# Patient Record
Sex: Female | Born: 1980 | Race: White | Hispanic: No | Marital: Married | Smoking: Former smoker
Health system: Southern US, Community
[De-identification: ages and names within clinical notes are randomized; demographics above are authoritative.]

## PROBLEM LIST (undated history)

## (undated) DIAGNOSIS — R51 Headache: Secondary | ICD-10-CM

## (undated) DIAGNOSIS — F419 Anxiety disorder, unspecified: Secondary | ICD-10-CM

## (undated) DIAGNOSIS — G8929 Other chronic pain: Secondary | ICD-10-CM

## (undated) DIAGNOSIS — Z9889 Other specified postprocedural states: Secondary | ICD-10-CM

## (undated) DIAGNOSIS — R519 Headache, unspecified: Secondary | ICD-10-CM

## (undated) HISTORY — PX: CERVICAL DISC SURGERY: SHX588

## (undated) HISTORY — PX: APPENDECTOMY: SHX54

## (undated) HISTORY — PX: ENDOMETRIAL ABLATION: SHX621

---

## 2003-11-06 ENCOUNTER — Ambulatory Visit (HOSPITAL_COMMUNITY): Admission: RE | Admit: 2003-11-06 | Discharge: 2003-11-06 | Payer: Self-pay | Admitting: Family Medicine

## 2004-03-13 ENCOUNTER — Ambulatory Visit: Payer: Self-pay | Admitting: Family Medicine

## 2004-04-16 ENCOUNTER — Ambulatory Visit: Payer: Self-pay | Admitting: Family Medicine

## 2004-09-19 ENCOUNTER — Ambulatory Visit: Payer: Self-pay | Admitting: Family Medicine

## 2005-03-14 ENCOUNTER — Ambulatory Visit: Payer: Self-pay | Admitting: Family Medicine

## 2005-04-29 ENCOUNTER — Ambulatory Visit: Payer: Self-pay | Admitting: Family Medicine

## 2005-09-05 ENCOUNTER — Ambulatory Visit: Payer: Self-pay | Admitting: Family Medicine

## 2005-10-09 ENCOUNTER — Ambulatory Visit: Payer: Self-pay | Admitting: Family Medicine

## 2006-06-09 ENCOUNTER — Ambulatory Visit: Payer: Self-pay | Admitting: Family Medicine

## 2006-07-01 ENCOUNTER — Ambulatory Visit: Payer: Self-pay | Admitting: Family Medicine

## 2007-02-11 ENCOUNTER — Ambulatory Visit (HOSPITAL_COMMUNITY): Payer: Self-pay | Admitting: Psychiatry

## 2007-02-19 ENCOUNTER — Ambulatory Visit (HOSPITAL_COMMUNITY): Payer: Self-pay | Admitting: Psychiatry

## 2007-03-04 ENCOUNTER — Ambulatory Visit (HOSPITAL_COMMUNITY): Payer: Self-pay | Admitting: Psychiatry

## 2007-03-18 ENCOUNTER — Ambulatory Visit (HOSPITAL_COMMUNITY): Payer: Self-pay | Admitting: Psychiatry

## 2007-03-25 ENCOUNTER — Ambulatory Visit (HOSPITAL_COMMUNITY): Payer: Self-pay | Admitting: Psychiatry

## 2007-03-30 ENCOUNTER — Ambulatory Visit (HOSPITAL_COMMUNITY): Payer: Self-pay | Admitting: Psychiatry

## 2007-04-06 ENCOUNTER — Ambulatory Visit (HOSPITAL_COMMUNITY): Payer: Self-pay | Admitting: Psychiatry

## 2007-04-22 ENCOUNTER — Ambulatory Visit (HOSPITAL_COMMUNITY): Payer: Self-pay | Admitting: Psychiatry

## 2007-04-27 ENCOUNTER — Ambulatory Visit (HOSPITAL_COMMUNITY): Payer: Self-pay | Admitting: Psychiatry

## 2007-05-11 ENCOUNTER — Ambulatory Visit (HOSPITAL_COMMUNITY): Payer: Self-pay | Admitting: Psychiatry

## 2007-05-17 ENCOUNTER — Ambulatory Visit (HOSPITAL_COMMUNITY): Payer: Self-pay | Admitting: Psychiatry

## 2007-05-20 ENCOUNTER — Ambulatory Visit (HOSPITAL_COMMUNITY): Payer: Self-pay | Admitting: Psychiatry

## 2007-05-25 ENCOUNTER — Ambulatory Visit (HOSPITAL_COMMUNITY): Payer: Self-pay | Admitting: Psychiatry

## 2007-06-02 ENCOUNTER — Ambulatory Visit (HOSPITAL_COMMUNITY): Payer: Self-pay | Admitting: Psychiatry

## 2007-06-10 ENCOUNTER — Ambulatory Visit (HOSPITAL_COMMUNITY): Payer: Self-pay | Admitting: Psychiatry

## 2009-11-06 ENCOUNTER — Emergency Department (HOSPITAL_COMMUNITY): Admission: EM | Admit: 2009-11-06 | Discharge: 2009-11-07 | Payer: Self-pay | Admitting: Emergency Medicine

## 2010-07-17 ENCOUNTER — Emergency Department (HOSPITAL_BASED_OUTPATIENT_CLINIC_OR_DEPARTMENT_OTHER)
Admission: EM | Admit: 2010-07-17 | Discharge: 2010-07-17 | Disposition: A | Discharge status: Home / Self Care | Payer: PRIVATE HEALTH INSURANCE | Attending: Emergency Medicine | Admitting: Emergency Medicine

## 2010-07-17 DIAGNOSIS — N9489 Other specified conditions associated with female genital organs and menstrual cycle: Secondary | ICD-10-CM | POA: Insufficient documentation

## 2010-07-17 DIAGNOSIS — F17200 Nicotine dependence, unspecified, uncomplicated: Secondary | ICD-10-CM | POA: Insufficient documentation

## 2010-07-17 DIAGNOSIS — I10 Essential (primary) hypertension: Secondary | ICD-10-CM | POA: Insufficient documentation

## 2010-07-17 LAB — PREGNANCY, URINE: Preg Test, Ur: NEGATIVE

## 2010-07-17 LAB — URINALYSIS, ROUTINE W REFLEX MICROSCOPIC
Bilirubin Urine: NEGATIVE
Leukocytes, UA: NEGATIVE
pH: 6 (ref 5.0–8.0)

## 2010-07-17 LAB — URINE MICROSCOPIC-ADD ON

## 2010-07-29 LAB — CBC
HCT: 41.6 % (ref 36.0–46.0)
Hemoglobin: 14.2 g/dL (ref 12.0–15.0)
MCH: 34 pg (ref 26.0–34.0)
MCV: 99.4 fL (ref 78.0–100.0)
Platelets: 222 10*3/uL (ref 150–400)
RDW: 13.9 % (ref 11.5–15.5)

## 2010-07-29 LAB — DIFFERENTIAL
Eosinophils Relative: 1 % (ref 0–5)
Lymphocytes Relative: 35 % (ref 12–46)
Lymphs Abs: 3.8 10*3/uL (ref 0.7–4.0)
Monocytes Absolute: 0.6 10*3/uL (ref 0.1–1.0)
Neutrophils Relative %: 58 % (ref 43–77)

## 2010-07-29 LAB — BASIC METABOLIC PANEL
GFR calc Af Amer: >60 mL/min (ref >60)
GFR calc non Af Amer: >60 mL/min (ref >60)
Glucose, Bld: 93 mg/dL (ref 70–99)
Sodium: 142 mEq/L (ref 135–145)

## 2010-07-29 LAB — URINALYSIS, ROUTINE W REFLEX MICROSCOPIC
Ketones, ur: NEGATIVE mg/dL
Nitrite: NEGATIVE
Protein, ur: NEGATIVE mg/dL
Specific Gravity, Urine: 1.015 (ref 1.005–1.030)
pH: 6.5 (ref 5.0–8.0)

## 2010-07-29 LAB — POCT CARDIAC MARKERS: Myoglobin, poc: 52.5 ng/mL (ref 12–200)

## 2010-07-29 LAB — URINE MICROSCOPIC-ADD ON

## 2012-02-17 ENCOUNTER — Encounter (HOSPITAL_COMMUNITY): Payer: Self-pay | Admitting: *Deleted

## 2012-02-17 ENCOUNTER — Other Ambulatory Visit: Payer: Self-pay

## 2012-02-17 ENCOUNTER — Emergency Department (HOSPITAL_COMMUNITY)
Admission: EM | Admit: 2012-02-17 | Discharge: 2012-02-17 | Disposition: A | Discharge status: Home / Self Care | Payer: PRIVATE HEALTH INSURANCE | Attending: Emergency Medicine | Admitting: Emergency Medicine

## 2012-02-17 ENCOUNTER — Emergency Department (HOSPITAL_COMMUNITY): Payer: PRIVATE HEALTH INSURANCE

## 2012-02-17 DIAGNOSIS — R079 Chest pain, unspecified: Secondary | ICD-10-CM | POA: Insufficient documentation

## 2012-02-17 DIAGNOSIS — R0789 Other chest pain: Secondary | ICD-10-CM

## 2012-02-17 DIAGNOSIS — R0602 Shortness of breath: Secondary | ICD-10-CM | POA: Insufficient documentation

## 2012-02-17 LAB — CBC WITH DIFFERENTIAL/PLATELET
Basophils Absolute: 0.1 10*3/uL (ref 0.0–0.1)
Basophils Relative: 1 % (ref 0–1)
Eosinophils Absolute: 0.1 10*3/uL (ref 0.0–0.7)
Eosinophils Relative: 2 % (ref 0–5)
HCT: 38.3 % (ref 36.0–46.0)
MCHC: 33.9 g/dL (ref 30.0–36.0)
Monocytes Absolute: 0.4 10*3/uL (ref 0.1–1.0)
Monocytes Relative: 6 % (ref 3–12)
Platelets: 202 10*3/uL (ref 150–400)
WBC: 6.9 10*3/uL (ref 4.0–10.5)

## 2012-02-17 LAB — COMPREHENSIVE METABOLIC PANEL
ALT: 16 U/L (ref 0–35)
AST: 15 U/L (ref 0–37)
Albumin: 3.8 g/dL (ref 3.5–5.2)
BUN: 11 mg/dL (ref 6–23)
CO2: 27 mEq/L (ref 19–32)
Chloride: 104 mEq/L (ref 96–112)
Creatinine, Ser: 1.03 mg/dL (ref 0.50–1.10)
GFR calc Af Amer: 83 mL/min — ABNORMAL LOW (ref >90)
Total Protein: 7 g/dL (ref 6.0–8.3)

## 2012-02-17 MED ORDER — GI COCKTAIL ~~LOC~~
30.0000 mL | Freq: Once | ORAL | Status: AC
  Administered 2012-02-17: 30 mL via ORAL
  Filled 2012-02-17: qty 30

## 2012-02-17 MED ORDER — KETOROLAC TROMETHAMINE 60 MG/2ML IM SOLN
60.0000 mg | Freq: Once | INTRAMUSCULAR | Status: AC
  Administered 2012-02-17: 60 mg via INTRAMUSCULAR
  Filled 2012-02-17: qty 2

## 2012-02-17 MED ORDER — IBUPROFEN 800 MG PO TABS: 800.0000 mg | ORAL_TABLET | Freq: Three times a day (TID) | ORAL | Status: DC

## 2012-02-17 NOTE — ED Provider Notes (Signed)
History     CSN: [id number]  Arrival date & time 02/17/12  1212   First MD Initiated Contact with Patient 02/17/12 1320      Chief Complaint  Patient presents with  . Chest Pain    (Consider location/radiation/quality/duration/timing/severity/associated sxs/prior treatment) HPI Comments: Patient presents with sharp stabbing chest pain for the past 3 days. Has become constant though started intermittently. It is in the left side of her chest and radiates to the right chest as well as left arm. . it is worse with deep breathing and worse with arm movement. She denies any shortness of breath, nausea, fever, diaphoresis. No abdominal pain or back pain. No cardiac history. No recent travel, leg pain leg swelling. She is not on birth control.  The history is provided by the patient.    History reviewed. No pertinent past medical history.  Past Surgical History  Procedure Date  . Appendectomy     History reviewed. No pertinent family history.  History  Substance Use Topics  . Smoking status: Never Smoker   . Smokeless tobacco: Not on file  . Alcohol Use: Yes    OB History    Grav Para Term Preterm Abortions TAB SAB Ect Mult Living                  Review of Systems  Constitutional: Negative for fever, activity change and appetite change.  HENT: Negative for congestion and rhinorrhea.   Respiratory: Positive for chest tightness and shortness of breath. Negative for cough.   Cardiovascular: Positive for chest pain.  Gastrointestinal: Negative for nausea, vomiting and abdominal pain.  Genitourinary: Negative for dysuria and hematuria.  Musculoskeletal: Negative for myalgias, back pain and arthralgias.  Skin: Negative for rash.  Neurological: Negative for dizziness, weakness and headaches.    Allergies  Codeine  Home Medications   Current Outpatient Rx  Name Route Sig Dispense Refill  . ASPIRIN-ACETAMINOPHEN-CAFFEINE 250-250-65 MG PO TABS Oral Take 1 tablet by mouth  every 6 (six) hours as needed. Headache    . BUPROPION HCL ER (XL) 300 MG PO TB24 Oral Take 300 mg by mouth daily.    . IBUPROFEN 800 MG PO TABS Oral Take 800 mg by mouth every 8 (eight) hours as needed. Pain    . LORAZEPAM 1 MG PO TABS Oral Take 1 mg by mouth at bedtime as needed. Sleep    . ADULT MULTIVITAMIN W/MINERALS CH Oral Take 1 tablet by mouth daily.    . IBUPROFEN 800 MG PO TABS Oral Take 1 tablet (800 mg total) by mouth 3 (three) times daily. 21 tablet 0    BP 130/81  Pulse 74  Temp 98.3 F (36.8 C) (Oral)  Resp 20  Ht 5\' 9"  (1.753 m)  Wt 195 lb (88.451 kg)  BMI 28.80 kg/m2  SpO2 99%  LMP 02/06/2012  Physical Exam  Constitutional: She is oriented to person, place, and time. She appears well-developed and well-nourished. No distress.  HENT:  Head: Normocephalic and atraumatic.  Mouth/Throat: Oropharynx is clear and moist. No oropharyngeal exudate.  Eyes: Conjunctivae normal and EOM are normal. Pupils are equal, round, and reactive to light.  Neck: Normal range of motion. Neck supple.  Cardiovascular: Normal rate, regular rhythm and normal heart sounds.   No murmur heard. Pulmonary/Chest: Effort normal and breath sounds normal. No respiratory distress. She exhibits tenderness.       TTP L chest wall, pain worse with L arm movement.  Abdominal: Soft. There is no  tenderness. There is no rebound and no guarding.  Musculoskeletal: Normal range of motion. She exhibits no edema and no tenderness.  Neurological: She is alert and oriented to person, place, and time. No cranial nerve deficit.  Skin: Skin is warm.    ED Course  Procedures (including critical care time)  Labs Reviewed  COMPREHENSIVE METABOLIC PANEL - Abnormal; Notable for the following:    GFR calc non Af Amer 72 (*)     GFR calc Af Amer 83 (*)     All other components within normal limits  CBC WITH DIFFERENTIAL  TROPONIN I  D-DIMER, QUANTITATIVE   Dg Chest 2 View  02/17/2012  *RADIOLOGY REPORT*   Clinical Data: Chest pain  CHEST - 2 VIEW  Comparison: November 06, 2009  Findings: Lungs clear.  Heart size and pulmonary vascularity are normal.  No adenopathy.  No bone lesions.  IMPRESSION: Lungs clear.   Original Report Authenticated By: Arvin Collard. WOODRUFF III, M.D.      1. Atypical chest pain       MDM  Intermittent chest pain worse with deep breathing, worse with arm movement, pleuritic component. No associated symptoms.  Chest xray negative.  D-dimer negative. Pain improved with NSAIDs. Atypical for ACS. Possible pleurisy. Workup unremarkable.  Supportive care with NSAIDs, followup PCP.   Date: 02/17/2012  Rate: 81  Rhythm: normal sinus rhythm  QRS Axis: normal  Intervals: normal  ST/T Wave abnormalities: normal  Conduction Disutrbances:right bundle branch block  Narrative Interpretation: no prolonged QT, no Brugada  Old EKG Reviewed: unchanged         Glynn Octave, MD 02/17/12 1449

## 2012-02-17 NOTE — ED Notes (Signed)
Chest pain intermittently for 2 days, Increased pain with deep breath, sharp, stabbing pain.  No n/v

## 2012-10-06 ENCOUNTER — Ambulatory Visit (HOSPITAL_COMMUNITY): Payer: PRIVATE HEALTH INSURANCE | Admitting: Psychology

## 2012-10-20 ENCOUNTER — Ambulatory Visit (HOSPITAL_COMMUNITY): Payer: PRIVATE HEALTH INSURANCE | Admitting: Psychiatry

## 2013-09-03 ENCOUNTER — Encounter: Payer: Self-pay | Admitting: Emergency Medicine

## 2013-09-03 ENCOUNTER — Emergency Department
Admission: EM | Admit: 2013-09-03 | Discharge: 2013-09-03 | Disposition: A | Discharge status: Home / Self Care | Payer: 59 | Source: Home / Self Care | Attending: Family Medicine | Admitting: Family Medicine

## 2013-09-03 DIAGNOSIS — L03039 Cellulitis of unspecified toe: Secondary | ICD-10-CM

## 2013-09-03 DIAGNOSIS — L02619 Cutaneous abscess of unspecified foot: Secondary | ICD-10-CM

## 2013-09-03 DIAGNOSIS — L089 Local infection of the skin and subcutaneous tissue, unspecified: Secondary | ICD-10-CM

## 2013-09-03 DIAGNOSIS — S90859A Superficial foreign body, unspecified foot, initial encounter: Secondary | ICD-10-CM

## 2013-09-03 DIAGNOSIS — S90451A Superficial foreign body, right great toe, initial encounter: Secondary | ICD-10-CM

## 2013-09-03 DIAGNOSIS — L03031 Cellulitis of right toe: Secondary | ICD-10-CM

## 2013-09-03 HISTORY — DX: Headache, unspecified: R51.9

## 2013-09-03 HISTORY — DX: Other chronic pain: G89.29

## 2013-09-03 HISTORY — DX: Anxiety disorder, unspecified: F41.9

## 2013-09-03 HISTORY — DX: Headache: R51

## 2013-09-03 MED ORDER — TETANUS-DIPHTH-ACELL PERTUSSIS 5-2.5-18.5 LF-MCG/0.5 IM SUSP
0.5000 mL | Freq: Once | INTRAMUSCULAR | Status: AC
  Administered 2013-09-03: 0.5 mL via INTRAMUSCULAR

## 2013-09-03 MED ORDER — DOXYCYCLINE HYCLATE 100 MG PO CAPS: 100.0000 mg | ORAL_CAPSULE | Freq: Two times a day (BID) | ORAL | Status: DC

## 2013-09-03 NOTE — Discharge Instructions (Signed)
Change dressing daily and apply Bacitracin ointment to wound.  Keep wound clean and dry.  Return for any signs of infection (or follow-up with family doctor):  Increasing redness, swelling, pain, heat, drainage, etc.  Finish doxycycline. May take Ibuprofen 200mg , 4 tabs every 8 hours with food if needed for pain. If symptoms become significantly worse during the night or over the weekend, proceed to the local emergency room.    Cellulitis Cellulitis is an infection of the skin and the tissue beneath it. The infected area is usually red and tender. Cellulitis occurs most often in the arms and lower legs.  CAUSES  Cellulitis is caused by bacteria that enter the skin through cracks or cuts in the skin. The most common types of bacteria that cause cellulitis are Staphylococcus and Streptococcus. SYMPTOMS   Redness and warmth.  Swelling.  Tenderness or pain.  Fever. DIAGNOSIS  Your caregiver can usually determine what is wrong based on a physical exam. Blood tests may also be done. TREATMENT  Treatment usually involves taking an antibiotic medicine. HOME CARE INSTRUCTIONS   Take your antibiotics as directed. Finish them even if you start to feel better.  Keep the infected arm or leg elevated to reduce swelling.  Apply a warm cloth to the affected area up to 4 times per day to relieve pain.  Only take over-the-counter or prescription medicines for pain, discomfort, or fever as directed by your caregiver.  Keep all follow-up appointments as directed by your caregiver. SEEK MEDICAL CARE IF:   You notice red streaks coming from the infected area.  Your red area gets larger or turns dark in color.  Your bone or joint underneath the infected area becomes painful after the skin has healed.  Your infection returns in the same area or another area.  You notice a swollen bump in the infected area.  You develop new symptoms. SEEK IMMEDIATE MEDICAL CARE IF:   You have a fever.  You  feel very sleepy.  You develop vomiting or diarrhea.  You have a general ill feeling (malaise) with muscle aches and pains. MAKE SURE YOU:   Understand these instructions.  Will watch your condition.  Will get help right away if you are not doing well or get worse. Document Released: 02/05/2005 Document Revised: 10/28/2011 Document Reviewed: 07/14/2011 [facility] Patient Information 2014 [employer].

## 2013-09-03 NOTE — ED Notes (Signed)
States got a toothpick ( from floor) stuck in large right toe 3 days ago; tried to remove but portion remains; now extremely sore with surrounding induration and redness.

## 2013-09-03 NOTE — ED Provider Notes (Signed)
CSN: [id number]     Arrival date & time 09/03/13  0907 History   First MD Initiated Contact with Patient 09/03/13 0935     Chief Complaint  Patient presents with  . Foreign Body  . Toe Pain     HPI Comments: Patient complains of a toothpick fragment embedded in the tip of her right great toe for 3 days.  She has been unable to remove it and the toe has become increasingly painful.  She does not remember her last Tdap.                                                                                                                                                                                                                     Patient is a 33 y.o. female presenting with foreign body. The history is provided by the patient and the spouse.  Foreign Body Intake: right great toe. Suspected object: wood splinter (toothpick) Pain quality:  Aching Pain severity:  Moderate Duration:  3 days Timing:  Constant Progression:  Worsening Chronicity:  New Exacerbated by: walking. Ineffective treatments:  Removal attempts with tweezers Associated symptoms: no nausea     Past Medical History  Diagnosis Date  . Chronic headaches   . Anxiety    Past Surgical History  Procedure Laterality Date  . Appendectomy     Family History  Problem Relation Age of Onset  . Hypertension Mother   . Heart failure Father    History  Substance Use Topics  . Smoking status: Never Smoker   . Smokeless tobacco: Not on file  . Alcohol Use: Yes   OB History   Grav Para Term Preterm Abortions TAB SAB Ect Mult Living                 Review of Systems  Gastrointestinal: Negative for nausea.    Allergies  Codeine  Home Medications   Prior to Admission medications   Medication Sig Start Date End Date Taking? Authorizing Provider  aspirin-acetaminophen-caffeine (EXCEDRIN MIGRAINE) [phone] MG per tablet Take 1 tablet by mouth every 6 (six) hours as needed. Headache    Historical Provider, MD  buPROPion  (WELLBUTRIN XL) 300 MG 24 hr tablet Take 300 mg by mouth daily.    Historical Provider, MD  ibuprofen (ADVIL,MOTRIN) 800 MG tablet Take 800 mg by mouth every 8 (eight) hours as needed. Pain    Historical Provider, MD  ibuprofen (ADVIL,MOTRIN) 800 MG tablet Take 1 tablet (800 mg total) by mouth  3 (three) times daily. 02/17/12   Glynn OctaveStephen Rancour, MD  LORazepam (ATIVAN) 1 MG tablet Take 1 mg by mouth at bedtime as needed. Sleep    Historical Provider, MD  Multiple Vitamin (MULTIVITAMIN WITH MINERALS) TABS Take 1 tablet by mouth daily.    Historical Provider, MD   BP 130/79  Pulse 93  Temp(Src) 98.5 F (36.9 C) (Oral)  Resp 16  Ht 5\' 7"  (1.702 m)  Wt 189 lb (85.73 kg)  BMI 29.59 kg/m2  SpO2 97% Physical Exam  Nursing note and vitals reviewed. Constitutional: She is oriented to person, place, and time. She appears well-developed and well-nourished. No distress.  Eyes: Conjunctivae are normal. Pupils are equal, round, and reactive to light.  Musculoskeletal:       Right foot: She exhibits tenderness and swelling. She exhibits no bony tenderness and normal capillary refill.       Feet:  The very tip of patient's right great toe is slightly swollen and tender to palpation.  There appears to be a tiny closed puncture wound present at tip.  No drainage noted.  Neurological: She is alert and oriented to person, place, and time.  Skin: Skin is warm and dry.    ED Course  Procedures  Foreign body removal. Discussed benefits and risks of procedure and verbal consent obtained. Using sterile technique and digital 0.5% Marcaine without epinephrine, cleansed toe with Betadine and alcohol. Incised tip of toe at site of splinter entry with a #11 blade.  Easily removed a small 3mm long wood splinter. Minimal purulent drainage present.  Bacitracin and non-stick sterile dressing applied.  Wound precautions explained to patient.        Labs Reviewed  WOUND CULTURE         MDM   1. Cellulitis of  great toe, right   2. Foreign body of great toe, right, infected    Wound culture pending.  Begin doxycycline.   Tdap administered. Change dressing daily and apply Bacitracin ointment to wound.  Keep wound clean and dry.  Return for any signs of infection (or follow-up with family doctor):  Increasing redness, swelling, pain, heat, drainage, etc.  Finish doxycycline. May take Ibuprofen 200mg , 4 tabs every 8 hours with food if needed for pain. If symptoms become significantly worse during the night or over the weekend, proceed to the local emergency room.     Lattie HawStephen A Norm Wray, MD 09/09/13 [phone]

## 2013-09-05 LAB — WOUND CULTURE
GRAM STAIN: NONE SEEN
Gram Stain: NONE SEEN
Gram Stain: NONE SEEN
Organism ID, Bacteria: NO GROWTH

## 2013-11-21 ENCOUNTER — Encounter: Payer: Self-pay | Admitting: Obstetrics & Gynecology

## 2013-11-21 ENCOUNTER — Ambulatory Visit (INDEPENDENT_AMBULATORY_CARE_PROVIDER_SITE_OTHER): Payer: 59 | Admitting: Obstetrics & Gynecology

## 2013-11-21 VITALS — BP 120/80 | Ht 69.0 in | Wt 196.0 lb

## 2013-11-21 DIAGNOSIS — N920 Excessive and frequent menstruation with regular cycle: Secondary | ICD-10-CM

## 2013-11-21 DIAGNOSIS — N946 Dysmenorrhea, unspecified: Secondary | ICD-10-CM | POA: Insufficient documentation

## 2013-11-21 HISTORY — DX: Excessive and frequent menstruation with regular cycle: N92.0

## 2013-11-21 MED ORDER — MEGESTROL ACETATE 40 MG PO TABS: ORAL_TABLET | ORAL | Status: DC

## 2013-11-21 NOTE — Progress Notes (Signed)
Patient ID: Elizabeth Franklin, female   DOB: 1981/02/04, 33 y.o.   MRN: [id number] Chief Complaint  Patient presents with  . GYN VISIT    [facility]    HPI Has been having lots of bleeding issues over the past year or so, sometimes heavy sometimes light variable Been managed with accelerated depo for the past 6 months or so Also taking pills  ROS No burning with urination, frequency or urgency No nausea, vomiting or diarrhea Nor fever chills or other constitutional symptoms   Blood pressure 120/80, height 5\' 9"  (1.753 m), weight 196 lb (88.905 kg), last menstrual period 08/01/2013.  EXAM None today  Assessment/Plan:  Menometrorrhagia with associated dysmenorrhea:  Megestrol therapy, sonogram 1 month and then discuss longer range plans

## 2013-11-24 ENCOUNTER — Ambulatory Visit (INDEPENDENT_AMBULATORY_CARE_PROVIDER_SITE_OTHER): Payer: 59 | Admitting: Emergency Medicine

## 2013-11-24 VITALS — BP 122/74 | HR 87 | Temp 99.7°F | Resp 16 | Ht 67.75 in | Wt 193.2 lb

## 2013-11-24 DIAGNOSIS — S60469A Insect bite (nonvenomous) of unspecified finger, initial encounter: Secondary | ICD-10-CM

## 2013-11-24 DIAGNOSIS — S60468A Insect bite (nonvenomous) of other finger, initial encounter: Secondary | ICD-10-CM

## 2013-11-24 DIAGNOSIS — W57XXXA Bitten or stung by nonvenomous insect and other nonvenomous arthropods, initial encounter: Secondary | ICD-10-CM

## 2013-11-24 DIAGNOSIS — M7989 Other specified soft tissue disorders: Secondary | ICD-10-CM

## 2013-11-24 MED ORDER — METHYLPREDNISOLONE ACETATE 80 MG/ML IJ SUSP
120.0000 mg | Freq: Once | INTRAMUSCULAR | Status: AC
  Administered 2013-11-24: 120 mg via INTRAMUSCULAR

## 2013-11-24 NOTE — Progress Notes (Signed)
[facility] [address], [place] [id number] [phone]  Date:  11/24/2013   Name:  Elizabeth GoldmannCeleste A Nance   DOB:  02-Aug-1980   MRN:  [id number]  PCP:  Toma DeitersHASANAJ,XAJE A, MD    Chief Complaint: Insect Bite   History of Present Illness:  Elizabeth GoldmannCeleste A Nance is a 33 y.o. very pleasant female patient who presents with the following:  Stung yesterday by a "bee".  Has swelling in the left hand.  Now concerned it is into her wrist.  No respiratory symptoms.  No swelling in face.  No improvement with over the counter medications or other home remedies. Denies other complaint or health concern today.   Patient Active Problem List   Diagnosis Date Noted  . Excessive or frequent menstruation 11/21/2013  . Dysmenorrhea 11/21/2013    Past Medical History  Diagnosis Date  . Chronic headaches   . Anxiety     Past Surgical History  Procedure Laterality Date  . Appendectomy      History  Substance Use Topics  . Smoking status: Never Smoker   . Smokeless tobacco: Not on file  . Alcohol Use: Yes    Family History  Problem Relation Age of Onset  . Hypertension Mother   . Mental illness Mother   . Heart failure Father   . Heart disease Father   . Hypertension Father   . Diabetes Brother   . Hyperlipidemia Brother   . Cancer Paternal Grandmother   . Diabetes Paternal Grandmother   . Heart disease Paternal Grandmother     Allergies  Allergen Reactions  . Codeine Nausea And Vomiting    Medication list has been reviewed and updated.  Current Outpatient Prescriptions on File Prior to Visit  Medication Sig Dispense Refill  . aspirin-acetaminophen-caffeine (EXCEDRIN MIGRAINE) 250-250-65 MG per tablet Take 1 tablet by mouth every 6 (six) hours as needed. Headache      . buPROPion (WELLBUTRIN XL) 300 MG 24 hr tablet Take 300 mg by mouth daily.      Marland Kitchen. LORazepam (ATIVAN) 1 MG tablet Take 1 mg by mouth at bedtime as needed. Sleep      . megestrol (MEGACE) 40 MG tablet  3 tablets a day for 5 days, 2 tablets a day for 5 days the 1 tablet a day  45 tablet  11  . Multiple Vitamin (MULTIVITAMIN WITH MINERALS) TABS Take 1 tablet by mouth daily.      Marland Kitchen. doxycycline (VIBRAMYCIN) 100 MG capsule Take 1 capsule (100 mg total) by mouth 2 (two) times daily.  14 capsule  0  . ibuprofen (ADVIL,MOTRIN) 800 MG tablet Take 800 mg by mouth every 8 (eight) hours as needed. Pain      . ibuprofen (ADVIL,MOTRIN) 800 MG tablet Take 1 tablet (800 mg total) by mouth 3 (three) times daily.  21 tablet  0  . norgestimate-ethinyl estradiol (ORTHO-CYCLEN,SPRINTEC,PREVIFEM) 0.25-35 MG-MCG tablet Take 1 tablet by mouth daily.       No current facility-administered medications on file prior to visit.    Review of Systems:  As per HPI, otherwise negative.    Physical Examination: Filed Vitals:   11/24/13 1458  BP: 122/74  Pulse: 87  Temp: 99.7 F (37.6 C)  Resp: 16   Filed Vitals:   11/24/13 1458  Height: 5' 7.75" (1.721 m)  Weight: 193 lb 3.2 oz (87.635 kg)   Body mass index is 29.59 kg/(m^2). Ideal Body Weight: Weight in (lb) to have BMI =  25: 162.9   GEN: WDWN, NAD, Non-toxic, Alert & Oriented x 3 HEENT: Atraumatic, Normocephalic.  Ears and Nose: No external deformity. EXTR: No clubbing/cyanosis/edema NEURO: Normal gait.  PSYCH: Normally interactive. Conversant. Not depressed or anxious appearing.  Calm demeanor.  Left hand:  Swelling of fingers and dorsal hand.  No rings  Assessment and Plan: Insect sting Continue benadryl Depo medrol   Signed,  Phillips Odor, MD

## 2013-11-24 NOTE — Patient Instructions (Signed)

## 2013-12-22 ENCOUNTER — Ambulatory Visit (INDEPENDENT_AMBULATORY_CARE_PROVIDER_SITE_OTHER): Payer: 59

## 2013-12-22 ENCOUNTER — Ambulatory Visit (INDEPENDENT_AMBULATORY_CARE_PROVIDER_SITE_OTHER): Payer: 59 | Admitting: Obstetrics & Gynecology

## 2013-12-22 ENCOUNTER — Ambulatory Visit: Payer: 59 | Admitting: Obstetrics & Gynecology

## 2013-12-22 ENCOUNTER — Encounter: Payer: Self-pay | Admitting: Obstetrics & Gynecology

## 2013-12-22 VITALS — BP 148/100 | Ht 69.0 in | Wt 196.0 lb

## 2013-12-22 DIAGNOSIS — N946 Dysmenorrhea, unspecified: Secondary | ICD-10-CM

## 2013-12-22 DIAGNOSIS — N920 Excessive and frequent menstruation with regular cycle: Secondary | ICD-10-CM

## 2013-12-22 DIAGNOSIS — Z3009 Encounter for other general counseling and advice on contraception: Secondary | ICD-10-CM

## 2013-12-28 ENCOUNTER — Ambulatory Visit: Payer: 59 | Admitting: Internal Medicine

## 2014-01-19 ENCOUNTER — Encounter (HOSPITAL_COMMUNITY): Payer: Self-pay | Admitting: Pharmacy Technician

## 2014-01-24 ENCOUNTER — Other Ambulatory Visit: Payer: Self-pay | Admitting: Obstetrics & Gynecology

## 2014-01-25 ENCOUNTER — Encounter (HOSPITAL_COMMUNITY): Payer: Self-pay

## 2014-01-25 ENCOUNTER — Encounter (HOSPITAL_COMMUNITY)
Admission: RE | Admit: 2014-01-25 | Discharge: 2014-01-25 | Disposition: A | Discharge status: Home / Self Care | Payer: 59 | Source: Ambulatory Visit | Attending: Obstetrics & Gynecology | Admitting: Obstetrics & Gynecology

## 2014-01-25 DIAGNOSIS — F411 Generalized anxiety disorder: Secondary | ICD-10-CM | POA: Diagnosis not present

## 2014-01-25 DIAGNOSIS — Z641 Problems related to multiparity: Secondary | ICD-10-CM | POA: Insufficient documentation

## 2014-01-25 DIAGNOSIS — Z79899 Other long term (current) drug therapy: Secondary | ICD-10-CM | POA: Diagnosis not present

## 2014-01-25 DIAGNOSIS — Z87891 Personal history of nicotine dependence: Secondary | ICD-10-CM | POA: Diagnosis not present

## 2014-01-25 DIAGNOSIS — Z01812 Encounter for preprocedural laboratory examination: Secondary | ICD-10-CM | POA: Diagnosis present

## 2014-01-25 DIAGNOSIS — N946 Dysmenorrhea, unspecified: Secondary | ICD-10-CM | POA: Insufficient documentation

## 2014-01-25 DIAGNOSIS — Z302 Encounter for sterilization: Secondary | ICD-10-CM | POA: Diagnosis not present

## 2014-01-25 DIAGNOSIS — Z7982 Long term (current) use of aspirin: Secondary | ICD-10-CM | POA: Diagnosis not present

## 2014-01-25 DIAGNOSIS — N920 Excessive and frequent menstruation with regular cycle: Secondary | ICD-10-CM | POA: Insufficient documentation

## 2014-01-25 LAB — COMPREHENSIVE METABOLIC PANEL
ALT: 25 U/L (ref 0–35)
AST: 18 U/L (ref 0–37)
Albumin: 4 g/dL (ref 3.5–5.2)
Alkaline Phosphatase: 42 U/L (ref 39–117)
Anion gap: 13 (ref 5–15)
BUN: 14 mg/dL (ref 6–23)
CHLORIDE: 105 meq/L (ref 96–112)
CO2: 25 meq/L (ref 19–32)
CREATININE: 1.13 mg/dL — AB (ref 0.50–1.10)
Calcium: 9.5 mg/dL (ref 8.4–10.5)
GFR, EST AFRICAN AMERICAN: 73 mL/min — AB (ref >90)
GFR, EST NON AFRICAN AMERICAN: 63 mL/min — AB (ref >90)
GLUCOSE: 66 mg/dL — AB (ref 70–99)
Potassium: 4.6 mEq/L (ref 3.7–5.3)
Sodium: 143 mEq/L (ref 137–147)
Total Bilirubin: 0.8 mg/dL (ref 0.3–1.2)
Total Protein: 7.2 g/dL (ref 6.0–8.3)

## 2014-01-25 LAB — URINALYSIS, ROUTINE W REFLEX MICROSCOPIC
Bilirubin Urine: NEGATIVE
Glucose, UA: NEGATIVE mg/dL
Hgb urine dipstick: NEGATIVE
KETONES UR: NEGATIVE mg/dL
Leukocytes, UA: NEGATIVE
Nitrite: NEGATIVE
PH: 5.5 (ref 5.0–8.0)
PROTEIN: NEGATIVE mg/dL
Specific Gravity, Urine: 1.025 (ref 1.005–1.030)
Urobilinogen, UA: 0.2 mg/dL (ref 0.0–1.0)

## 2014-01-25 LAB — CBC
HEMATOCRIT: 40.3 % (ref 36.0–46.0)
HEMOGLOBIN: 13.2 g/dL (ref 12.0–15.0)
MCH: 31.1 pg (ref 26.0–34.0)
MCHC: 32.8 g/dL (ref 30.0–36.0)
MCV: 95 fL (ref 78.0–100.0)
PLATELETS: 237 10*3/uL (ref 150–400)
RBC: 4.24 MIL/uL (ref 3.87–5.11)
RDW: 13.5 % (ref 11.5–15.5)
WBC: 8.2 10*3/uL (ref 4.0–10.5)

## 2014-01-25 LAB — HCG, QUANTITATIVE, PREGNANCY

## 2014-01-25 NOTE — Pre-Procedure Instructions (Signed)
Patient given information to sign up for my chart at home. 

## 2014-01-25 NOTE — Patient Instructions (Signed)
Freyja A Nance  01/25/2014   Your procedure is scheduled on:  01/30/2014  Report to [facility] at  615  AM.  Call this number if you have problems the morning of surgery: [phone]   Remember:   Do not eat food or drink liquids after midnight.   Take these medicines the morning of surgery with A SIP OF WATER:  Wellbutrin, ativan, megace   Do not wear jewelry, make-up or nail polish.  Do not wear lotions, powders, or perfumes.   Do not shave 48 hours prior to surgery. Men may shave face and neck.  Do not bring valuables to the hospital.  [facility] is not responsible for any belongings or valuables.               Contacts, dentures or bridgework may not be worn into surgery.  Leave suitcase in the car. After surgery it may be brought to your room.  For patients admitted to the hospital, discharge time is determined by your treatment team.               Patients discharged the day of surgery will not be allowed to drive home.  Name and phone number of your driver: family  Special Instructions: Shower using CHG 2 nights before surgery and the night before surgery.  If you shower the day of surgery use CHG.  Use special wash - you have one bottle of CHG for all showers.  You should use approximately 1/3 of the bottle for each shower.   Please read over the following fact sheets that you were given: Pain Booklet, Coughing and Deep Breathing, Surgical Site Infection Prevention, Anesthesia Post-op Instructions and Care and Recovery After Surgery Endometrial Ablation Endometrial ablation removes the lining of the uterus (endometrium). It is usually a same-day, outpatient treatment. Ablation helps avoid major surgery, such as surgery to remove the cervix and uterus (hysterectomy). After endometrial ablation, you will have little or no menstrual bleeding and may not be able to have children. However, if you are premenopausal, you will need to use a reliable method of birth control  following the procedure because of the small chance that pregnancy can occur. There are different reasons to have this procedure, which include:  Heavy periods.  Bleeding that is causing anemia.  Irregular bleeding.  Bleeding fibroids on the lining inside the uterus if they are smaller than 3 centimeters. This procedure should not be done if:  You want children in the future.  You have severe cramps with your menstrual period.  You have precancerous or cancerous cells in your uterus.  You were recently pregnant.  You have gone through menopause.  You have had major surgery on the uterus, such as a cesarean delivery. LET [facility] CARE PROVIDER KNOW ABOUT:  Any allergies you have.  All medicines you are taking, including vitamins, herbs, eye drops, creams, and over-the-counter medicines.  Previous problems you or members of your family have had with the use of anesthetics.  Any blood disorders you have.  Previous surgeries you have had.  Medical conditions you have. RISKS AND COMPLICATIONS  Generally, this is a safe procedure. However, as with any procedure, complications can occur. Possible complications include:  Perforation of the uterus.  Bleeding.  Infection of the uterus, bladder, or vagina.  Injury to surrounding organs.  An air bubble to the lung (air embolus).  Pregnancy following the procedure.  Failure of the procedure  to help the problem, requiring hysterectomy.  Decreased ability to diagnose cancer in the lining of the uterus. BEFORE THE PROCEDURE  The lining of the uterus must be tested to make sure there is no pre-cancerous or cancer cells present.  An ultrasound may be performed to look at the size of the uterus and to check for abnormalities.  Medicines may be given to thin the lining of the uterus. PROCEDURE  During the procedure, your health care provider will use a tool called a resectoscope to help see inside your uterus. There are  different ways to remove the lining of your uterus.   Radiofrequency - This method uses a radiofrequency-alternating electric current to remove the lining of the uterus.  Cryotherapy - This method uses extreme cold to freeze the lining of the uterus.  Heated-Free Liquid - This method uses heated salt (saline) solution to remove the lining of the uterus.  Microwave - This method uses high-energy microwaves to heat up the lining of the uterus to remove it.  Thermal balloon - This method involves inserting a catheter with a balloon tip into the uterus. The balloon tip is filled with heated fluid to remove the lining of the uterus. AFTER THE PROCEDURE  After your procedure, do not have sexual intercourse or insert anything into your vagina until permitted by your health care provider. After the procedure, you may experience:  Cramps.  Vaginal discharge.  Frequent urination. Document Released: 03/07/2004 Document Revised: 12/29/2012 Document Reviewed: 09/29/2012 [facility] Patient Information 2015 [place], [place]. This information is not intended to replace advice given to you by your health care provider. Make sure you discuss any questions you have with your health care provider. Hysteroscopy Hysteroscopy is a procedure used for looking inside the womb (uterus). It may be done for various reasons, including:  To evaluate abnormal bleeding, fibroid (benign, noncancerous) tumors, polyps, scar tissue (adhesions), and possibly cancer of the uterus.  To look for lumps (tumors) and other uterine growths.  To look for causes of why a woman cannot get pregnant (infertility), causes of recurrent loss of pregnancy (miscarriages), or a lost intrauterine device (IUD).  To perform a sterilization by blocking the fallopian tubes from inside the uterus. In this procedure, a thin, flexible tube with a tiny light and camera on the end of it (hysteroscope) is used to look inside the uterus. A hysteroscopy  should be done right after a menstrual period to be sure you are not pregnant. LET [facility] CARE PROVIDER KNOW ABOUT:   Any allergies you have.  All medicines you are taking, including vitamins, herbs, eye drops, creams, and over-the-counter medicines.  Previous problems you or members of your family have had with the use of anesthetics.  Any blood disorders you have.  Previous surgeries you have had.  Medical conditions you have. RISKS AND COMPLICATIONS  Generally, this is a safe procedure. However, as with any procedure, complications can occur. Possible complications include:  Putting a hole in the uterus.  Excessive bleeding.  Infection.  Damage to the cervix.  Injury to other organs.  Allergic reaction to medicines.  Too much fluid used in the uterus for the procedure. BEFORE THE PROCEDURE   Ask your health care provider about changing or stopping any regular medicines.  Do not take aspirin or blood thinners for 1 week before the procedure, or as directed by your health care provider. These can cause bleeding.  If you smoke, do not smoke for 2 weeks before the procedure.  In some cases, a medicine is placed in the cervix the day before the procedure. This medicine makes the cervix have a larger opening (dilate). This makes it easier for the instrument to be inserted into the uterus during the procedure.  Do not eat or drink anything for at least 8 hours before the surgery.  Arrange for someone to take you home after the procedure. PROCEDURE   You may be given a medicine to relax you (sedative). You may also be given one of the following:  A medicine that numbs the area around the cervix (local anesthetic).  A medicine that makes you sleep through the procedure (general anesthetic).  The hysteroscope is inserted through the vagina into the uterus. The camera on the hysteroscope sends a picture to a TV screen. This gives the surgeon a good view inside the  uterus.  During the procedure, air or a liquid is put into the uterus, which allows the surgeon to see better.  Sometimes, tissue is gently scraped from inside the uterus. These tissue samples are sent to a lab for testing. AFTER THE PROCEDURE   If you had a general anesthetic, you may be groggy for a couple hours after the procedure.  If you had a local anesthetic, you will be able to go home as soon as you are stable and feel ready.  You may have some cramping. This normally lasts for a couple days.  You may have bleeding, which varies from light spotting for a few days to menstrual-like bleeding for 3-7 days. This is normal.  If your test results are not back during the visit, make an appointment with your health care provider to find out the results. Document Released: 08/04/2000 Document Revised: 02/16/2013 Document Reviewed: 11/25/2012 [facility] Patient Information 2015 [place], [place]. This information is not intended to replace advice given to you by your health care provider. Make sure you discuss any questions you have with your health care provider. Dilation and Curettage or Vacuum Curettage Dilation and curettage (D&C) and vacuum curettage are minor procedures. A D&C involves stretching (dilation) the cervix and scraping (curettage) the inside lining of the womb (uterus). During a D&C, tissue is gently scraped from the inside lining of the uterus. During a vacuum curettage, the lining and tissue in the uterus are removed with the use of gentle suction.  Curettage may be performed to either diagnose or treat a problem. As a diagnostic procedure, curettage is performed to examine tissues from the uterus. A diagnostic curettage may be performed for the following symptoms:   Irregular bleeding in the uterus.   Bleeding with the development of clots.   Spotting between menstrual periods.   Prolonged menstrual periods.   Bleeding after menopause.   No menstrual period  (amenorrhea).   A change in size and shape of the uterus.  As a treatment procedure, curettage may be performed for the following reasons:   Removal of an IUD (intrauterine device).   Removal of retained placenta after giving birth. Retained placenta can cause an infection or bleeding severe enough to require transfusions.   Abortion.   Miscarriage.   Removal of polyps inside the uterus.   Removal of uncommon types of noncancerous lumps (fibroids).  LET [facility] CARE PROVIDER KNOW ABOUT:   Any allergies you have.   All medicines you are taking, including vitamins, herbs, eye drops, creams, and over-the-counter medicines.   Previous problems you or members of your family have had with the use of anesthetics.  Any blood disorders you have.   Previous surgeries you have had.   Medical conditions you have. RISKS AND COMPLICATIONS  Generally, this is a safe procedure. However, as with any procedure, complications can occur. Possible complications include:  Excessive bleeding.   Infection of the uterus.   Damage to the cervix.   Development of scar tissue (adhesions) inside the uterus, later causing abnormal amounts of menstrual bleeding.   Complications from the general anesthetic, if a general anesthetic is used.   Putting a hole (perforation) in the uterus. This is rare.  BEFORE THE PROCEDURE   Eat and drink before the procedure only as directed by your health care provider.   Arrange for someone to take you home.  PROCEDURE  This procedure usually takes about 15-30 minutes.  You will be given one of the following:  A medicine that numbs the area in and around the cervix (local anesthetic).   A medicine to make you sleep through the procedure (general anesthetic).  You will lie on your back with your legs in stirrups.   A warm metal or plastic instrument (speculum) will be placed in your vagina to keep it open and to allow the health  care provider to see the cervix.  There are two ways in which your cervix can be softened and dilated. These include:   Taking a medicine.   Having thin rods (laminaria) inserted into your cervix.   A curved tool (curette) will be used to scrape cells from the inside lining of the uterus. In some cases, gentle suction is applied with the curette. The curette will then be removed.  AFTER THE PROCEDURE   You will rest in the recovery area until you are stable and are ready to go home.   You may feel sick to your stomach (nauseous) or throw up (vomit) if you were given a general anesthetic.   You may have a sore throat if a tube was placed in your throat during general anesthesia.   You may have light cramping and bleeding. This may last for 2 days to 2 weeks after the procedure.   Your uterus needs to make a new lining after the procedure. This may make your next period late. Document Released: 04/28/2005 Document Revised: 12/29/2012 Document Reviewed: 11/25/2012 [facility] Patient Information 2015 [place], [place]. This information is not intended to replace advice given to you by your health care provider. Make sure you discuss any questions you have with your health care provider. PATIENT INSTRUCTIONS POST-ANESTHESIA  IMMEDIATELY FOLLOWING SURGERY:  Do not drive or operate machinery for the first twenty four hours after surgery.  Do not make any important decisions for twenty four hours after surgery or while taking narcotic pain medications or sedatives.  If you develop intractable nausea and vomiting or a severe headache please notify your doctor immediately.  FOLLOW-UP:  Please make an appointment with your surgeon as instructed. You do not need to follow up with anesthesia unless specifically instructed to do so.  WOUND CARE INSTRUCTIONS (if applicable):  Keep a dry clean dressing on the anesthesia/puncture wound site if there is drainage.  Once the wound has quit draining you  may leave it open to air.  Generally you should leave the bandage intact for twenty four hours unless there is drainage.  If the epidural site drains for more than 36-48 hours please call the anesthesia department.  QUESTIONS?:  Please feel free to call your physician or the hospital operator if you have  any questions, and they will be happy to assist you.

## 2014-01-30 ENCOUNTER — Encounter (HOSPITAL_COMMUNITY)
Admission: RE | Disposition: A | Discharge status: Home / Self Care | Payer: Self-pay | Source: Ambulatory Visit | Attending: Obstetrics & Gynecology

## 2014-01-30 ENCOUNTER — Encounter (HOSPITAL_COMMUNITY): Payer: 59 | Admitting: Anesthesiology

## 2014-01-30 ENCOUNTER — Ambulatory Visit (HOSPITAL_COMMUNITY)
Admission: RE | Admit: 2014-01-30 | Discharge: 2014-01-30 | Disposition: A | Discharge status: Home / Self Care | Payer: 59 | Source: Ambulatory Visit | Attending: Obstetrics & Gynecology | Admitting: Obstetrics & Gynecology

## 2014-01-30 ENCOUNTER — Ambulatory Visit (HOSPITAL_COMMUNITY): Payer: 59 | Admitting: Anesthesiology

## 2014-01-30 ENCOUNTER — Encounter (HOSPITAL_COMMUNITY): Payer: Self-pay | Admitting: *Deleted

## 2014-01-30 DIAGNOSIS — Z302 Encounter for sterilization: Secondary | ICD-10-CM | POA: Diagnosis not present

## 2014-01-30 DIAGNOSIS — F411 Generalized anxiety disorder: Secondary | ICD-10-CM | POA: Insufficient documentation

## 2014-01-30 DIAGNOSIS — N946 Dysmenorrhea, unspecified: Secondary | ICD-10-CM | POA: Insufficient documentation

## 2014-01-30 DIAGNOSIS — Z79899 Other long term (current) drug therapy: Secondary | ICD-10-CM | POA: Insufficient documentation

## 2014-01-30 DIAGNOSIS — Z87891 Personal history of nicotine dependence: Secondary | ICD-10-CM | POA: Insufficient documentation

## 2014-01-30 DIAGNOSIS — N920 Excessive and frequent menstruation with regular cycle: Secondary | ICD-10-CM

## 2014-01-30 DIAGNOSIS — Z9889 Other specified postprocedural states: Secondary | ICD-10-CM

## 2014-01-30 DIAGNOSIS — Z7982 Long term (current) use of aspirin: Secondary | ICD-10-CM | POA: Insufficient documentation

## 2014-01-30 DIAGNOSIS — Z9851 Tubal ligation status: Secondary | ICD-10-CM

## 2014-01-30 HISTORY — PX: DILITATION & CURRETTAGE/HYSTROSCOPY WITH THERMACHOICE ABLATION: SHX5569

## 2014-01-30 HISTORY — PX: LAPAROSCOPIC BILATERAL SALPINGECTOMY: SHX5889

## 2014-01-30 SURGERY — SALPINGECTOMY, BILATERAL, LAPAROSCOPIC
Anesthesia: General | Site: Abdomen

## 2014-01-30 MED ORDER — ARTIFICIAL TEARS OP OINT
TOPICAL_OINTMENT | OPHTHALMIC | Status: DC | PRN
  Administered 2014-01-30: 1 via OPHTHALMIC

## 2014-01-30 MED ORDER — LACTATED RINGERS IV SOLN
INTRAVENOUS | Status: DC | PRN
  Administered 2014-01-30 (×2): via INTRAVENOUS

## 2014-01-30 MED ORDER — GLYCOPYRROLATE 0.2 MG/ML IJ SOLN
INTRAMUSCULAR | Status: DC | PRN
  Administered 2014-01-30: 0.6 mg via INTRAVENOUS

## 2014-01-30 MED ORDER — EPHEDRINE SULFATE 50 MG/ML IJ SOLN
INTRAMUSCULAR | Status: AC
  Filled 2014-01-30: qty 1

## 2014-01-30 MED ORDER — FENTANYL CITRATE 0.05 MG/ML IJ SOLN
INTRAMUSCULAR | Status: DC | PRN
  Administered 2014-01-30 (×5): 50 ug via INTRAVENOUS

## 2014-01-30 MED ORDER — BUPIVACAINE LIPOSOME 1.3 % IJ SUSP
INTRAMUSCULAR | Status: AC
  Filled 2014-01-30: qty 20

## 2014-01-30 MED ORDER — CEFAZOLIN SODIUM-DEXTROSE 2-3 GM-% IV SOLR
2.0000 g | INTRAVENOUS | Status: AC
  Administered 2014-01-30: 2 g via INTRAVENOUS
  Filled 2014-01-30: qty 50

## 2014-01-30 MED ORDER — DEXTROSE 5 % IV SOLN
INTRAVENOUS | Status: DC | PRN
  Administered 2014-01-30: 12 mL

## 2014-01-30 MED ORDER — SODIUM CHLORIDE 0.9 % IJ SOLN
INTRAMUSCULAR | Status: AC
  Filled 2014-01-30: qty 10

## 2014-01-30 MED ORDER — PROPOFOL 10 MG/ML IV BOLUS
INTRAVENOUS | Status: AC
  Filled 2014-01-30: qty 20

## 2014-01-30 MED ORDER — SUCCINYLCHOLINE CHLORIDE 20 MG/ML IJ SOLN
INTRAMUSCULAR | Status: AC
  Filled 2014-01-30: qty 1

## 2014-01-30 MED ORDER — GLYCOPYRROLATE 0.2 MG/ML IJ SOLN
0.2000 mg | Freq: Once | INTRAMUSCULAR | Status: AC
  Administered 2014-01-30: 0.2 mg via INTRAVENOUS

## 2014-01-30 MED ORDER — FENTANYL CITRATE 0.05 MG/ML IJ SOLN
INTRAMUSCULAR | Status: AC
  Filled 2014-01-30: qty 5

## 2014-01-30 MED ORDER — GLYCOPYRROLATE 0.2 MG/ML IJ SOLN
INTRAMUSCULAR | Status: AC
  Filled 2014-01-30: qty 1

## 2014-01-30 MED ORDER — ONDANSETRON HCL 4 MG/2ML IJ SOLN
4.0000 mg | Freq: Once | INTRAMUSCULAR | Status: AC
  Administered 2014-01-30: 4 mg via INTRAVENOUS

## 2014-01-30 MED ORDER — ONDANSETRON HCL 8 MG PO TABS: 8.0000 mg | ORAL_TABLET | Freq: Three times a day (TID) | ORAL | Status: DC | PRN

## 2014-01-30 MED ORDER — BUPIVACAINE LIPOSOME 1.3 % IJ SUSP
INTRAMUSCULAR | Status: DC | PRN
  Administered 2014-01-30: 20 mL

## 2014-01-30 MED ORDER — NEOSTIGMINE METHYLSULFATE 10 MG/10ML IV SOLN
INTRAVENOUS | Status: DC | PRN
  Administered 2014-01-30: 4 mg via INTRAVENOUS

## 2014-01-30 MED ORDER — FENTANYL CITRATE 0.05 MG/ML IJ SOLN
25.0000 ug | INTRAMUSCULAR | Status: DC | PRN
  Administered 2014-01-30: 50 ug via INTRAVENOUS
  Filled 2014-01-30: qty 2

## 2014-01-30 MED ORDER — KETOROLAC TROMETHAMINE 10 MG PO TABS: 10.0000 mg | ORAL_TABLET | Freq: Three times a day (TID) | ORAL | Status: DC | PRN

## 2014-01-30 MED ORDER — KETOROLAC TROMETHAMINE 30 MG/ML IJ SOLN
30.0000 mg | Freq: Once | INTRAMUSCULAR | Status: AC
  Administered 2014-01-30: 30 mg via INTRAVENOUS
  Filled 2014-01-30: qty 1

## 2014-01-30 MED ORDER — ONDANSETRON HCL 4 MG/2ML IJ SOLN
INTRAMUSCULAR | Status: AC
  Filled 2014-01-30: qty 2

## 2014-01-30 MED ORDER — ROCURONIUM BROMIDE 100 MG/10ML IV SOLN
INTRAVENOUS | Status: DC | PRN
  Administered 2014-01-30: 30 mg via INTRAVENOUS

## 2014-01-30 MED ORDER — SODIUM CHLORIDE 0.9 % IR SOLN
Status: DC | PRN
  Administered 2014-01-30: 1000 mL

## 2014-01-30 MED ORDER — FENTANYL CITRATE 0.05 MG/ML IJ SOLN
INTRAMUSCULAR | Status: AC
  Filled 2014-01-30: qty 2

## 2014-01-30 MED ORDER — FENTANYL CITRATE 0.05 MG/ML IJ SOLN
25.0000 ug | INTRAMUSCULAR | Status: AC
  Administered 2014-01-30 (×2): 25 ug via INTRAVENOUS

## 2014-01-30 MED ORDER — SCOPOLAMINE 1 MG/3DAYS TD PT72
1.0000 | MEDICATED_PATCH | TRANSDERMAL | Status: DC
  Administered 2014-01-30: 1.5 mg via TRANSDERMAL

## 2014-01-30 MED ORDER — ONDANSETRON HCL 4 MG/2ML IJ SOLN
4.0000 mg | Freq: Once | INTRAMUSCULAR | Status: AC | PRN
  Administered 2014-01-30: 4 mg via INTRAVENOUS

## 2014-01-30 MED ORDER — MIDAZOLAM HCL 2 MG/2ML IJ SOLN
1.0000 mg | INTRAMUSCULAR | Status: DC | PRN
  Administered 2014-01-30: 2 mg via INTRAVENOUS

## 2014-01-30 MED ORDER — LACTATED RINGERS IV SOLN
INTRAVENOUS | Status: DC
  Administered 2014-01-30: 1000 mL via INTRAVENOUS

## 2014-01-30 MED ORDER — DEXAMETHASONE SODIUM PHOSPHATE 4 MG/ML IJ SOLN
4.0000 mg | Freq: Once | INTRAMUSCULAR | Status: AC
  Administered 2014-01-30: 4 mg via INTRAVENOUS

## 2014-01-30 MED ORDER — LIDOCAINE HCL (PF) 1 % IJ SOLN
INTRAMUSCULAR | Status: AC
  Filled 2014-01-30: qty 5

## 2014-01-30 MED ORDER — OXYCODONE-ACETAMINOPHEN 5-325 MG PO TABS: 1.0000 | ORAL_TABLET | ORAL | Status: DC | PRN

## 2014-01-30 MED ORDER — SCOPOLAMINE 1 MG/3DAYS TD PT72
MEDICATED_PATCH | TRANSDERMAL | Status: AC
  Filled 2014-01-30: qty 1

## 2014-01-30 MED ORDER — LIDOCAINE HCL (CARDIAC) 20 MG/ML IV SOLN
INTRAVENOUS | Status: DC | PRN
  Administered 2014-01-30: 50 mg via INTRAVENOUS

## 2014-01-30 MED ORDER — GLYCOPYRROLATE 0.2 MG/ML IJ SOLN
INTRAMUSCULAR | Status: AC
  Filled 2014-01-30: qty 3

## 2014-01-30 MED ORDER — MIDAZOLAM HCL 2 MG/2ML IJ SOLN
INTRAMUSCULAR | Status: AC
  Filled 2014-01-30: qty 2

## 2014-01-30 MED ORDER — DEXAMETHASONE SODIUM PHOSPHATE 4 MG/ML IJ SOLN
INTRAMUSCULAR | Status: AC
  Filled 2014-01-30: qty 1

## 2014-01-30 MED ORDER — ROCURONIUM BROMIDE 50 MG/5ML IV SOLN
INTRAVENOUS | Status: AC
  Filled 2014-01-30: qty 1

## 2014-01-30 SURGICAL SUPPLY — 65 items
APPLIER CLIP 5 13 M/L LIGAMAX5 (MISCELLANEOUS)
APR CLP MED LRG 5 ANG JAW (MISCELLANEOUS)
BAG DECANTER FOR FLEXI CONT (MISCELLANEOUS) ×4 IMPLANT
BAG HAMPER (MISCELLANEOUS) ×4 IMPLANT
BLADE SURG SZ11 CARB STEEL (BLADE) ×4 IMPLANT
CATH THERMACHOICE III (CATHETERS) ×4 IMPLANT
CLIP APPLIE 5 13 M/L LIGAMAX5 (MISCELLANEOUS) IMPLANT
CLOTH BEACON ORANGE TIMEOUT ST (SAFETY) ×4 IMPLANT
COVER LIGHT HANDLE STERIS (MISCELLANEOUS) ×8 IMPLANT
COVER MAYO STAND XLG (DRAPE) ×4 IMPLANT
DRAPE PROXIMA HALF (DRAPES) ×4 IMPLANT
ELECT REM PT RETURN 9FT ADLT (ELECTROSURGICAL) ×4
ELECTRODE REM PT RTRN 9FT ADLT (ELECTROSURGICAL) ×2 IMPLANT
FILTER SMOKE EVAC LAPAROSHD (FILTER) ×4 IMPLANT
FORMALIN 10 PREFIL 120ML (MISCELLANEOUS) ×6 IMPLANT
FORMALIN 10 PREFIL 480ML (MISCELLANEOUS) ×2 IMPLANT
GAUZE SPONGE 4X4 16PLY XRAY LF (GAUZE/BANDAGES/DRESSINGS) ×2 IMPLANT
GLOVE BIOGEL PI IND STRL 7.0 (GLOVE) IMPLANT
GLOVE BIOGEL PI IND STRL 8 (GLOVE) ×2 IMPLANT
GLOVE BIOGEL PI INDICATOR 7.0 (GLOVE) ×2
GLOVE BIOGEL PI INDICATOR 8 (GLOVE) ×2
GLOVE ECLIPSE 6.5 STRL STRAW (GLOVE) ×2 IMPLANT
GLOVE ECLIPSE 8.0 STRL XLNG CF (GLOVE) ×4 IMPLANT
GLOVE EXAM NITRILE MD LF STRL (GLOVE) ×4 IMPLANT
GOWN STRL REUS W/TWL LRG LVL3 (GOWN DISPOSABLE) ×4 IMPLANT
GOWN STRL REUS W/TWL XL LVL3 (GOWN DISPOSABLE) ×4 IMPLANT
INST SET HYSTEROSCOPY (KITS) ×4 IMPLANT
INST SET LAPROSCOPIC GYN AP (KITS) ×4 IMPLANT
IV D5W 500ML (IV SOLUTION) ×4 IMPLANT
IV NS 1000ML (IV SOLUTION) ×4
IV NS 1000ML BAXH (IV SOLUTION) ×2 IMPLANT
IV NS IRRIG 3000ML ARTHROMATIC (IV SOLUTION) IMPLANT
KIT ROOM TURNOVER AP CYSTO (KITS) ×4 IMPLANT
KIT ROOM TURNOVER APOR (KITS) ×4 IMPLANT
MANIFOLD NEPTUNE II (INSTRUMENTS) ×4 IMPLANT
MARKER SKIN DUAL TIP RULER LAB (MISCELLANEOUS) ×4 IMPLANT
NDL HYPO 18GX1.5 BLUNT FILL (NEEDLE) IMPLANT
NDL INSUFFLATION 14GA 120MM (NEEDLE) ×2 IMPLANT
NEEDLE HYPO 18GX1.5 BLUNT FILL (NEEDLE) ×4 IMPLANT
NEEDLE INSUFFLATION 120MM (ENDOMECHANICALS) ×2 IMPLANT
NEEDLE INSUFFLATION 14GA 120MM (NEEDLE) ×4 IMPLANT
NS IRRIG 1000ML POUR BTL (IV SOLUTION) ×4 IMPLANT
PACK BASIC III (CUSTOM PROCEDURE TRAY) ×4
PACK PERI GYN (CUSTOM PROCEDURE TRAY) ×2 IMPLANT
PACK SRG BSC III STRL LF ECLPS (CUSTOM PROCEDURE TRAY) ×2 IMPLANT
PAD ARMBOARD 7.5X6 YLW CONV (MISCELLANEOUS) ×4 IMPLANT
PAD TELFA 3X4 1S STER (GAUZE/BANDAGES/DRESSINGS) ×4 IMPLANT
SCALPEL HARMONIC ACE (MISCELLANEOUS) ×4 IMPLANT
SET BASIN LINEN APH (SET/KITS/TRAYS/PACK) ×4 IMPLANT
SET IRRIG Y TYPE TUR BLADDER L (SET/KITS/TRAYS/PACK) ×4 IMPLANT
SET TUBE IRRIG SUCTION NO TIP (IRRIGATION / IRRIGATOR) IMPLANT
SHEET LAVH (DRAPES) ×4 IMPLANT
SOLUTION ANTI FOG 6CC (MISCELLANEOUS) ×6 IMPLANT
SPONGE GAUZE 2X2 8PLY STER LF (GAUZE/BANDAGES/DRESSINGS) ×3
SPONGE GAUZE 2X2 8PLY STRL LF (GAUZE/BANDAGES/DRESSINGS) ×3 IMPLANT
STAPLER VISISTAT 35W (STAPLE) ×4 IMPLANT
SUT VICRYL 0 UR6 27IN ABS (SUTURE) ×4 IMPLANT
SYR 20CC LL (SYRINGE) ×6 IMPLANT
SYRINGE 10CC LL (SYRINGE) ×2 IMPLANT
TAPE CLOTH SURG 4X10 WHT LF (GAUZE/BANDAGES/DRESSINGS) ×2 IMPLANT
TRAY FOLEY CATH 16FR SILVER (SET/KITS/TRAYS/PACK) ×2 IMPLANT
TROCAR XCEL NON-BLD 5MMX100MML (ENDOMECHANICALS) ×2 IMPLANT
TUBING HI FLO HEAT INSUFFLATOR (IRRIGATION / IRRIGATOR) ×2 IMPLANT
WARMER LAPAROSCOPE (MISCELLANEOUS) ×6 IMPLANT
YANKAUER SUCT BULB TIP 10FT TU (MISCELLANEOUS) ×4 IMPLANT

## 2014-01-30 NOTE — Transfer of Care (Signed)
Immediate Anesthesia Transfer of Care Note  Patient: Elizabeth Franklin  Procedure(s) Performed: Procedure(s) with comments: LAPAROSCOPIC BILATERAL SALPINGECTOMY (Bilateral) DILATATION & CURETTAGE/HYSTEROSCOPY WITH THERMACHOICE ABLATION (N/A) - Total Ablation Therapy Time 9 minutes 5 seconds; 86-88 deg C;   Patient Location: PACU  Anesthesia Type:General  Level of Consciousness: awake, alert , oriented and patient cooperative  Airway & Oxygen Therapy: Patient Spontanous Breathing and Patient connected to face mask oxygen  Post-op Assessment: Report given to PACU RN and Post -op Vital signs reviewed and stable  Post vital signs: Reviewed and stable  Complications: No apparent anesthesia complications

## 2014-01-30 NOTE — Op Note (Signed)
Preoperative Diagnosis:  1.  Multiparous female desires permanent sterilization                                          2.  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  Description of Operation:  Patient was taken to the OR and placed into supine position where she underwent general anaesthesia.  She was placed in the dorsal lithotomy position and prepped and draped in the usual sterile fashion.  An incision was made in the umbilicus and dissection taken down to the rectus fascia which was incised and opened.  The non bladed trocar was then placed and the peritoneal cavity was insufflated.  The above noted findings were observed.  Additional trocars were placed in the right and left lower quadrants under direct visualization without difficulty.  The Harmonic scalpel was employed and salpingectomy of both the right and left tubes was performed.   The tubes were removed from the peritoneal cavity and sent to pathology.  There was good hemostasis bilaterally.  The fascia, peritoneum and subcutaneous tissue were closed using 0 vicryl.  The skin was closed using staples.  Exparel 266 mg 20 cc was injected in the 3 incision trocar sites. The patient was awakened from anaesthesia and taken to the PACU with all counts being correct x 3.  The patient received  2 gram of ancef andToradol 30 mg IV preoperatively.  Lazaro Arms 01/30/2014 8:42 AM   Preoperative diagnosis: Menometrorrhagia                                        Dysmenorrhea   Postoperative diagnoses: Same as above   Procedure: Hysteroscopy, uterine curettage, endometrial ablation  Surgeon: Despina Hidden MD  Anesthesia: Laryngeal mask  airway  Findings: The endometrium was normal. There were no fibroid or other abnormalities.  Description of operation: The patient was taken to the operating room and placed in the supine position. She underwent general anesthesia using the laryngeal mask airway. She was placed in the dorsal lithotomy position and prepped and draped in the usual sterile fashion. A Graves speculum was placed and the anterior cervical lip was grasped with a single-tooth tenaculum. The cervix was dilated serially to allow passage of the hysteroscope. Diagnostic hysteroscopy was performed and was found to be normal. A vigorous uterine curettage was then performed and all tissue sent to pathology for evaluation. The ThermaChoice 3 endometrial ablation balloon was then used were 12 cc of D5W was required to maintain a pressure of 190-200 mm of mercury throughout the procedure. Toatl therapy time was 9:05.  All of the equipment worked well throughout the procedure. All of the fluid was returned at the end of the procedure. The patient was awakened from anesthesia and taken to  the recovery room in good stable condition all counts were correct. She received 2 g of Ancef and 30 mg of Toradol preoperatively. She will be discharged from the recovery room and followed up in the office in 1- 2 weeks.  Mc Bloodworth H 01/30/2014 8:42 AM

## 2014-01-30 NOTE — Anesthesia Postprocedure Evaluation (Signed)
  Anesthesia Post-op Note  Patient: Elizabeth Franklin  Procedure(s) Performed: Procedure(s) with comments: LAPAROSCOPIC BILATERAL SALPINGECTOMY (Bilateral) DILATATION & CURETTAGE/HYSTEROSCOPY WITH THERMACHOICE ABLATION (N/A) - Total Ablation Therapy Time 9 minutes 5 seconds; 86-88 deg C;   Patient Location: PACU  Anesthesia Type:General  Level of Consciousness: awake, alert , oriented and patient cooperative  Airway and Oxygen Therapy: Patient Spontanous Breathing and Patient connected to face mask oxygen  Post-op Pain: mild  Post-op Assessment: Post-op Vital signs reviewed, Patient's Cardiovascular Status Stable, Respiratory Function Stable, Patent Airway, No signs of Nausea or vomiting and Pain level controlled  Post-op Vital Signs: Reviewed and stable  Last Vitals:  Filed Vitals:   01/30/14 0735  BP: 124/85  Pulse:   Temp:   Resp: 19    Complications: No apparent anesthesia complications

## 2014-01-30 NOTE — H&P (Signed)
Preoperative History and Physical  Elizabeth Franklin is a 33 y.o. No obstetric history on file. with Patient's last menstrual period was 12/08/2013. admitted for a laparoscopic bilateral salpingectomy for sterilization and hysteroscopy uterine curettage endometrial ablation for menometrorrhagia and dysmenorrhea.    PMH:    Past Medical History  Diagnosis Date  . Chronic headaches   . Anxiety     PSH:     Past Surgical History  Procedure Laterality Date  . Appendectomy      POb/GynH:      OB History   Grav Para Term Preterm Abortions TAB SAB Ect Mult Living                  SH:   History  Substance Use Topics  . Smoking status: Former Smoker -- 0.25 packs/day for 1 years    Types: Cigarettes    Quit date: 01/26/1999  . Smokeless tobacco: Never Used  . Alcohol Use: Yes     Comment: occassional    FH:    Family History  Problem Relation Age of Onset  . Hypertension Mother   . Mental illness Mother   . Heart failure Father   . Heart disease Father   . Hypertension Father   . Diabetes Brother   . Hyperlipidemia Brother   . Cancer Paternal Grandmother   . Diabetes Paternal Grandmother   . Heart disease Paternal Grandmother      Allergies:  Allergies  Allergen Reactions  . Codeine Nausea And Vomiting    Medications:      Current facility-administered medications:ceFAZolin (ANCEF) IVPB 2 g/50 mL premix, 2 g, Intravenous, On Call to OR, Lazaro Arms, MD;  fentaNYL (SUBLIMAZE) injection 25 mcg, 25 mcg, Intravenous, Q10 min, Laurene Footman, MD, 25 mcg at 01/30/14 0706;  glycopyrrolate (ROBINUL) injection 0.2 mg, 0.2 mg, Intravenous, Once, Laurene Footman, MD lactated ringers infusion, , Intravenous, Continuous, Laurene Footman, MD, Last Rate: 75 mL/hr at 01/30/14 0700, 1,000 mL at 01/30/14 0700;  midazolam (VERSED) injection 1-2 mg, 1-2 mg, Intravenous, Q5 Min x 3 PRN, Laurene Footman, MD, 2 mg at 01/30/14 4696  Review of Systems:   Review of Systems  Constitutional:  Negative for fever, chills, weight loss, malaise/fatigue and diaphoresis.  HENT: Negative for hearing loss, ear pain, nosebleeds, congestion, sore throat, neck pain, tinnitus and ear discharge.   Eyes: Negative for blurred vision, double vision, photophobia, pain, discharge and redness.  Respiratory: Negative for cough, hemoptysis, sputum production, shortness of breath, wheezing and stridor.   Cardiovascular: Negative for chest pain, palpitations, orthopnea, claudication, leg swelling and PND.  Gastrointestinal: Positive for abdominal pain. Negative for heartburn, nausea, vomiting, diarrhea, constipation, blood in stool and melena.  Genitourinary: Negative for dysuria, urgency, frequency, hematuria and flank pain.  Musculoskeletal: Negative for myalgias, back pain, joint pain and falls.  Skin: Negative for itching and rash.  Neurological: Negative for dizziness, tingling, tremors, sensory change, speech change, focal weakness, seizures, loss of consciousness, weakness and headaches.  Endo/Heme/Allergies: Negative for environmental allergies and polydipsia. Does not bruise/bleed easily.  Psychiatric/Behavioral: Negative for depression, suicidal ideas, hallucinations, memory loss and substance abuse. The patient is not nervous/anxious and does not have insomnia.      PHYSICAL EXAM:  Blood pressure 143/98, pulse 70, temperature 98.4 F (36.9 C), temperature source Oral, resp. rate 18, height  (1.753 m), weight 199 lb (90.266 kg), last menstrual period 12/08/2013, SpO2 94.00%.    Vitals reviewed. Constitutional: She is oriented to person, place, and  time. She appears well-developed and well-nourished.  HENT:  Head: Normocephalic and atraumatic.  Right Ear: External ear normal.  Left Ear: External ear normal.  Nose: Nose normal.  Mouth/Throat: Oropharynx is clear and moist.  Eyes: Conjunctivae and EOM are normal. Pupils are equal, round, and reactive to light. Right eye exhibits no  discharge. Left eye exhibits no discharge. No scleral icterus.  Neck: Normal range of motion. Neck supple. No tracheal deviation present. No thyromegaly present.  Cardiovascular: Normal rate, regular rhythm, normal heart sounds and intact distal pulses.  Exam reveals no gallop and no friction rub.   No murmur heard. Respiratory: Effort normal and breath sounds normal. No respiratory distress. She has no wheezes. She has no rales. She exhibits no tenderness.  GI: Soft. Bowel sounds are normal. She exhibits no distension and no mass. There is tenderness. There is no rebound and no guarding.  Genitourinary:       Vulva is normal without lesions Vagina is pink moist without discharge Cervix normal in appearance and pap is normal Uterus is normal Adnexa is negative with normal sized ovaries by sonogram  Musculoskeletal: Normal range of motion. She exhibits no edema and no tenderness.  Neurological: She is alert and oriented to person, place, and time. She has normal reflexes. She displays normal reflexes. No cranial nerve deficit. She exhibits normal muscle tone. Coordination normal.  Skin: Skin is warm and dry. No rash noted. No erythema. No pallor.  Psychiatric: She has a normal mood and affect. Her behavior is normal. Judgment and thought content normal.    Labs: Results for orders placed during the hospital encounter of 01/25/14 (from the past 336 hour(s))  CBC   Collection Time    01/25/14 10:15 AM      Result Value Ref Range   WBC 8.2  4.0 - 10.5 K/uL   RBC 4.24  3.87 - 5.11 MIL/uL   Hemoglobin 13.2  12.0 - 15.0 g/dL   HCT 16.1  09.6 - 04.5 %   MCV 95.0  78.0 - 100.0 fL   MCH 31.1  26.0 - 34.0 pg   MCHC 32.8  30.0 - 36.0 g/dL   RDW 40.9  81.1 - 91.4 %   Platelets 237  150 - 400 K/uL  COMPREHENSIVE METABOLIC PANEL   Collection Time    01/25/14 10:15 AM      Result Value Ref Range   Sodium 143  137 - 147 mEq/L   Potassium 4.6  3.7 - 5.3 mEq/L   Chloride 105  96 - 112 mEq/L    CO2 25  19 - 32 mEq/L   Glucose, Bld 66 (*) 70 - 99 mg/dL   BUN 14  6 - 23 mg/dL   Creatinine, Ser 7.82 (*) 0.50 - 1.10 mg/dL   Calcium 9.5  8.4 - 95.6 mg/dL   Total Protein 7.2  6.0 - 8.3 g/dL   Albumin 4.0  3.5 - 5.2 g/dL   AST 18  0 - 37 U/L   ALT 25  0 - 35 U/L   Alkaline Phosphatase 42  39 - 117 U/L   Total Bilirubin 0.8  0.3 - 1.2 mg/dL   GFR calc non Af Amer 63 (*) >90 mL/min   GFR calc Af Amer 73 (*) >90 mL/min   Anion gap 13  5 - 15  HCG, QUANTITATIVE, PREGNANCY   Collection Time    01/25/14 10:15 AM      Result Value Ref Range   hCG,  Beta Chain, Quant, S <1  <5 mIU/mL  URINALYSIS, ROUTINE W REFLEX MICROSCOPIC   Collection Time    01/25/14 10:15 AM      Result Value Ref Range   Color, Urine YELLOW  YELLOW   APPearance CLEAR  CLEAR   Specific Gravity, Urine 1.025  1.005 - 1.030   pH 5.5  5.0 - 8.0   Glucose, UA NEGATIVE  NEGATIVE mg/dL   Hgb urine dipstick NEGATIVE  NEGATIVE   Bilirubin Urine NEGATIVE  NEGATIVE   Ketones, ur NEGATIVE  NEGATIVE mg/dL   Protein, ur NEGATIVE  NEGATIVE mg/dL   Urobilinogen, UA 0.2  0.0 - 1.0 mg/dL   Nitrite NEGATIVE  NEGATIVE   Leukocytes, UA NEGATIVE  NEGATIVE    EKG: Orders placed in visit on 02/17/12  . EKG 12-LEAD    Imaging Studies:     GYNECOLOGIC SONOGRAM  Talley A Memory Franklin is a 33 y.o. for a pelvic sonogram for menorrhagia and dysmenorrhea. Pt is currently taking Megace and has stopped bleeding.  Uterus 9.1 x 4.7 x 5.5 cm, anteverted, small Nabothian cysts noted within the cervix  Endometrium 10.5 mm, symmetrical,  Right ovary 2.2 x 1.7 x 1.6 cm,  Left ovary 2.8 x 1.9 x 1.3 cm,  No free fluid or adnexal masses noted within the pelvis  Technician Comments:  Anteverted uterus, Endom-10.42mm,small Nabothian cysts noted within the cervix, bilateral adnexa/ovaries appears WNL, no free fluid or adnexal masses noted within the pelvis  Chari Manning  12/22/2013  10:20 AM  Clinical Impression and recommendations:  I have  reviewed the sonogram results above, combined with the patient's current clinical course, below are my impressions and any appropriate recommendations for management based on the sonographic findings.  Normal pelvic anatomy  EURE,LUTHER H  12/22/2013  10:32 AM        Assessment: Patient Active Problem List   Diagnosis Date Noted  . Excessive or frequent menstruation 11/21/2013  . Dysmenorrhea 11/21/2013  desires sterilization  Plan: Laparoscopic bilateral salpingectomy for sterilization Hysteroscopy uterine curettage endometrial ablation  EURE,LUTHER H 01/30/2014 7:08 AM

## 2014-01-30 NOTE — Progress Notes (Signed)
Eyeglasses returned to pt. 

## 2014-01-30 NOTE — Anesthesia Preprocedure Evaluation (Signed)
Anesthesia Evaluation  Patient identified by MRN, date of birth, ID band Patient awake    Reviewed: H&P , NPO status , Patient's Chart, lab work & pertinent test results  Airway Mallampati: II TM Distance: >3 FB     Dental  (+) Teeth Intact   Pulmonary former smoker,  breath sounds clear to auscultation        Cardiovascular negative cardio ROS  Rhythm:Regular Rate:Normal     Neuro/Psych  Headaches, PSYCHIATRIC DISORDERS Anxiety    GI/Hepatic negative GI ROS,   Endo/Other    Renal/GU      Musculoskeletal   Abdominal   Peds  Hematology   Anesthesia Other Findings   Reproductive/Obstetrics                           Anesthesia Physical Anesthesia Plan  ASA: II  Anesthesia Plan: General   Post-op Pain Management:    Induction: Intravenous  Airway Management Planned: Oral ETT  Additional Equipment:   Intra-op Plan:   Post-operative Plan: Extubation in OR  Informed Consent: I have reviewed the patients History and Physical, chart, labs and discussed the procedure including the risks, benefits and alternatives for the proposed anesthesia with the patient or authorized representative who has indicated his/her understanding and acceptance.     Plan Discussed with:   Anesthesia Plan Comments:         Anesthesia Quick Evaluation

## 2014-01-30 NOTE — Discharge Instructions (Signed)
Laparoscopic Tubal Ligation °Laparoscopic tubal ligation is a procedure that closes the fallopian tubes at a time other than right after childbirth. By closing the fallopian tubes, the eggs that are released from the ovaries cannot enter the uterus and sperm cannot reach the egg. Tubal ligation is also known as getting your "tubes tied." Tubal ligation is done so you will not be able to get pregnant or have a baby.  °Although this procedure may be reversed, it should be considered permanent and irreversible. If you want to have future pregnancies, you should not have this procedure.  °LET YOUR CAREGIVER KNOW ABOUT: °· Allergies to food or medicine. °· Medicines taken, including vitamins, herbs, eyedrops, over-the-counter medicines, and creams. °· Use of steroids (by mouth or creams). °· Previous problems with numbing medicines. °· History of bleeding problems or blood clots. °· Any recent colds or infections. °· Previous surgery. °· Other health problems, including diabetes and kidney problems. °· Possibility of pregnancy, if this applies. °· Any past pregnancies. °RISKS AND COMPLICATIONS  °· Infection. °· Bleeding. °· Injury to surrounding organs. °· Anesthetic side effects. °· Failure of the procedure. °· Ectopic pregnancy. °· Future regret about having the procedure done. °BEFORE THE PROCEDURE °· Do not take aspirin or blood thinners a week before the procedure or as directed. This can cause bleeding. °· Do not eat or drink anything 6 to 8 hours before the procedure. °PROCEDURE  °· You may be given a medicine to help you relax (sedative) before the procedure. You will be given a medicine to make you sleep (general anesthetic) during the procedure. °· A tube will be put down your throat to help your breath while under general anesthesia. °· Two small cuts (incisions) are made in the lower abdominal area and near the belly button. °· Your abdominal area will be inflated with a safe gas (carbon dioxide). This helps  give the surgeon room to operate, visualize, and helps the surgeon avoid other organs. °· A thin, lighted tube (laparoscope) with a camera attached is inserted into your abdomen through one of the incisions near the belly button. Other small instruments are also inserted through the other abdominal incision. °· The fallopian tubes are located and are either blocked with a ring, clip, or are burned (cauterized). °· After the fallopian tubes are blocked, the gas is released from the abdomen. °· The incisions will be closed with stitches (sutures), and a bandage may be placed over the incisions. °AFTER THE PROCEDURE  °· You will rest in a recovery room for 1--4 hours until you are stable and doing well. °· You will also have some mild abdominal discomfort for 3--7 days. You will be given pain medicine to ease any discomfort. °· As long as there are no problems, you may be allowed to go home. Someone will need to drive you home and be with you for at least 24 hours once home. °· You may have some mild discomfort in the throat. This is from the tube placed in your throat while you were sleeping. °· You may experience discomfort in the shoulder area from some trapped air between the liver and diaphragm. This sensation is normal and will slowly go away on its own. °Document Released: 08/04/2000 Document Revised: 10/28/2011 Document Reviewed: 08/09/2011 °[employer]® Patient Information ©2015 [employer]. This information is not intended to replace advice given to you by your health care provider. Make sure you discuss any questions you have with your health care provider. °Endometrial Ablation °  Endometrial ablation removes the lining of the uterus (endometrium). It is usually a same-day, outpatient treatment. Ablation helps avoid major surgery, such as surgery to remove the cervix and uterus (hysterectomy). After endometrial ablation, you will have little or no menstrual bleeding and may not be able to have children.  However, if you are premenopausal, you will need to use a reliable method of birth control following the procedure because of the small chance that pregnancy can occur. °There are different reasons to have this procedure, which include: °· Heavy periods. °· Bleeding that is causing anemia. °· Irregular bleeding. °· Bleeding fibroids on the lining inside the uterus if they are smaller than 3 centimeters. °This procedure should not be done if: °· You want children in the future. °· You have severe cramps with your menstrual period. °· You have precancerous or cancerous cells in your uterus. °· You were recently pregnant. °· You have gone through menopause. °· You have had major surgery on the uterus, such as a cesarean delivery. °LET YOUR HEALTH CARE PROVIDER KNOW ABOUT: °· Any allergies you have. °· All medicines you are taking, including vitamins, herbs, eye drops, creams, and over-the-counter medicines. °· Previous problems you or members of your family have had with the use of anesthetics. °· Any blood disorders you have. °· Previous surgeries you have had. °· Medical conditions you have. °RISKS AND COMPLICATIONS  °Generally, this is a safe procedure. However, as with any procedure, complications can occur. Possible complications include: °· Perforation of the uterus. °· Bleeding. °· Infection of the uterus, bladder, or vagina. °· Injury to surrounding organs. °· An air bubble to the lung (air embolus). °· Pregnancy following the procedure. °· Failure of the procedure to help the problem, requiring hysterectomy. °· Decreased ability to diagnose cancer in the lining of the uterus. °BEFORE THE PROCEDURE °· The lining of the uterus must be tested to make sure there is no pre-cancerous or cancer cells present. °· An ultrasound may be performed to look at the size of the uterus and to check for abnormalities. °· Medicines may be given to thin the lining of the uterus. °PROCEDURE  °During the procedure, your health care  provider will use a tool called a resectoscope to help see inside your uterus. There are different ways to remove the lining of your uterus.  °· Radiofrequency - This method uses a radiofrequency-alternating electric current to remove the lining of the uterus. °· Cryotherapy - This method uses extreme cold to freeze the lining of the uterus. °· Heated-Free Liquid - This method uses heated salt (saline) solution to remove the lining of the uterus. °· Microwave - This method uses high-energy microwaves to heat up the lining of the uterus to remove it. °· Thermal balloon - This method involves inserting a catheter with a balloon tip into the uterus. The balloon tip is filled with heated fluid to remove the lining of the uterus. °AFTER THE PROCEDURE  °After your procedure, do not have sexual intercourse or insert anything into your vagina until permitted by your health care provider. After the procedure, you may experience: °· Cramps. °· Vaginal discharge. °· Frequent urination. °Document Released: 03/07/2004 Document Revised: 12/29/2012 Document Reviewed: 09/29/2012 °[employer]® Patient Information ©2015 [employer]. This information is not intended to replace advice given to you by your health care provider. Make sure you discuss any questions you have with your health care provider. ° °

## 2014-01-31 DIAGNOSIS — Z029 Encounter for administrative examinations, unspecified: Secondary | ICD-10-CM

## 2014-02-01 ENCOUNTER — Telehealth: Payer: Self-pay | Admitting: Obstetrics & Gynecology

## 2014-02-01 NOTE — Telephone Encounter (Signed)
Pt states has surgery Laparoscopic Bilateral Salpingectomy 01/30/2014 c/o "chest pain" that extends up to clavicle area, no shortness of breath, states when she takes deep breathes hurts in sternum area and epigastric area. Per Dr. Despina Hidden, pt needs to lay stomach down on her knees with shoulder lower than knees to help relieve the pressure. Dr. Despina Hidden states is from anesthesia and is common after surgery.  Pt informed to go to ER if chest pain increases, shortness of breath. Pt verbalized understanding.

## 2014-02-06 ENCOUNTER — Encounter: Payer: 59 | Admitting: Obstetrics & Gynecology

## 2014-02-07 ENCOUNTER — Encounter: Payer: Self-pay | Admitting: Obstetrics & Gynecology

## 2014-02-07 ENCOUNTER — Ambulatory Visit (INDEPENDENT_AMBULATORY_CARE_PROVIDER_SITE_OTHER): Payer: 59 | Admitting: Obstetrics & Gynecology

## 2014-02-07 VITALS — BP 140/100 | Ht 69.0 in | Wt 203.5 lb

## 2014-02-07 DIAGNOSIS — Z9889 Other specified postprocedural states: Secondary | ICD-10-CM

## 2014-02-07 NOTE — Progress Notes (Signed)
Patient ID: Elizabeth Franklin, female   DOB: 1981/01/12, 33 y.o.   MRN: [id number] 1 week post op from laparoscopic salpingectomy and endometrial ablation Had some post op shoulder chest pain better now Abdominal bruising \\staples  removed   Clear fluid discharge uterus normal post op tenderness  Follow up prn or 1 year

## 2014-02-07 NOTE — Progress Notes (Signed)
Patient ID: [name], female   DOB: 18-Mar-1981, 33 y.o.   MRN: [id number] GYNECOLOGIC SONOGRAM  Elizabeth Franklin is a 33 y.o. for a pelvic sonogram for menorrhagia and dysmenorrhea. Pt is currently taking Megace and has stopped bleeding.  Uterus 9.1 x 4.7 x 5.5 cm, anteverted, small Nabothian cysts noted within the cervix  Endometrium 10.5 mm, symmetrical,  Right ovary 2.2 x 1.7 x 1.6 cm,  Left ovary 2.8 x 1.9 x 1.3 cm,  No free fluid or adnexal masses noted within the pelvis  Technician Comments:  Anteverted uterus, Endom-10.475mm,small Nabothian cysts noted within the cervix, bilateral adnexa/ovaries appears WNL, no free fluid or adnexal masses noted within the pelvis  Elizabeth Franklin, Elizabeth Franklin  12/22/2013  10:20 AM  Clinical Impression and recommendations:  I have reviewed the sonogram results above, combined with the patient's current clinical course, below are my impressions and any appropriate recommendations for management based on the sonographic findings.  Normal pelvic anatomy  Elizabeth Franklin  12/22/2013  10:32 AM   Normal sonogram See prevous note Pt wants laparoscpic salpingectomy for sterilization and also endometrial ablation for her issues Preparations will be made when pt decides on timetable

## 2014-05-22 ENCOUNTER — Emergency Department (HOSPITAL_COMMUNITY)
Admission: EM | Admit: 2014-05-22 | Discharge: 2014-05-22 | Disposition: A | Discharge status: Home / Self Care | Payer: 59 | Attending: Emergency Medicine | Admitting: Emergency Medicine

## 2014-05-22 ENCOUNTER — Encounter (HOSPITAL_COMMUNITY): Payer: Self-pay | Admitting: Emergency Medicine

## 2014-05-22 ENCOUNTER — Emergency Department (HOSPITAL_COMMUNITY): Payer: 59

## 2014-05-22 DIAGNOSIS — M542 Cervicalgia: Secondary | ICD-10-CM | POA: Diagnosis not present

## 2014-05-22 DIAGNOSIS — Z79899 Other long term (current) drug therapy: Secondary | ICD-10-CM | POA: Diagnosis not present

## 2014-05-22 DIAGNOSIS — R079 Chest pain, unspecified: Secondary | ICD-10-CM | POA: Insufficient documentation

## 2014-05-22 DIAGNOSIS — F419 Anxiety disorder, unspecified: Secondary | ICD-10-CM | POA: Diagnosis not present

## 2014-05-22 DIAGNOSIS — G8929 Other chronic pain: Secondary | ICD-10-CM | POA: Diagnosis not present

## 2014-05-22 DIAGNOSIS — K2970 Gastritis, unspecified, without bleeding: Secondary | ICD-10-CM | POA: Diagnosis not present

## 2014-05-22 DIAGNOSIS — R0789 Other chest pain: Secondary | ICD-10-CM

## 2014-05-22 DIAGNOSIS — R000 Tachycardia, unspecified: Secondary | ICD-10-CM | POA: Insufficient documentation

## 2014-05-22 DIAGNOSIS — R51 Headache: Secondary | ICD-10-CM | POA: Diagnosis not present

## 2014-05-22 LAB — CBC
HCT: 41.8 % (ref 36.0–46.0)
Hemoglobin: 14.3 g/dL (ref 12.0–15.0)
MCH: 31.6 pg (ref 26.0–34.0)
MCHC: 34.2 g/dL (ref 30.0–36.0)
MCV: 92.5 fL (ref 78.0–100.0)
Platelets: 198 10*3/uL (ref 150–400)
RBC: 4.52 MIL/uL (ref 3.87–5.11)
RDW: 12.8 % (ref 11.5–15.5)
WBC: 8.8 10*3/uL (ref 4.0–10.5)

## 2014-05-22 LAB — BASIC METABOLIC PANEL
ANION GAP: 8 (ref 5–15)
BUN: 18 mg/dL (ref 6–23)
CALCIUM: 8.7 mg/dL (ref 8.4–10.5)
CHLORIDE: 105 meq/L (ref 96–112)
CO2: 22 mmol/L (ref 19–32)
CREATININE: 0.82 mg/dL (ref 0.50–1.10)
GFR calc non Af Amer: >90 mL/min (ref >90)
Glucose, Bld: 96 mg/dL (ref 70–99)
Potassium: 3.8 mmol/L (ref 3.5–5.1)
Sodium: 135 mmol/L (ref 135–145)

## 2014-05-22 LAB — HEPATIC FUNCTION PANEL
ALBUMIN: 4.3 g/dL (ref 3.5–5.2)
ALT: 55 U/L — AB (ref 0–35)
AST: 38 U/L — ABNORMAL HIGH (ref 0–37)
Alkaline Phosphatase: 50 U/L (ref 39–117)
BILIRUBIN DIRECT: 0.2 mg/dL (ref 0.0–0.3)
BILIRUBIN INDIRECT: 1.6 mg/dL — AB (ref 0.3–0.9)
BILIRUBIN TOTAL: 1.8 mg/dL — AB (ref 0.3–1.2)
TOTAL PROTEIN: 7.3 g/dL (ref 6.0–8.3)

## 2014-05-22 LAB — TROPONIN I

## 2014-05-22 LAB — D-DIMER, QUANTITATIVE (NOT AT ARMC): D DIMER QUANT: 0.54 ug{FEU}/mL — AB (ref 0.00–0.48)

## 2014-05-22 LAB — LIPASE, BLOOD: LIPASE: 25 U/L (ref 11–59)

## 2014-05-22 MED ORDER — SODIUM CHLORIDE 0.9 % IV BOLUS (SEPSIS)
1000.0000 mL | Freq: Once | INTRAVENOUS | Status: AC
  Administered 2014-05-22: 1000 mL via INTRAVENOUS

## 2014-05-22 MED ORDER — ONDANSETRON HCL 4 MG/2ML IJ SOLN
4.0000 mg | Freq: Once | INTRAMUSCULAR | Status: AC
  Administered 2014-05-22: 4 mg via INTRAVENOUS
  Filled 2014-05-22: qty 2

## 2014-05-22 MED ORDER — ONDANSETRON HCL 4 MG/2ML IJ SOLN
INTRAMUSCULAR | Status: AC
  Filled 2014-05-22: qty 2

## 2014-05-22 MED ORDER — IOHEXOL 350 MG/ML SOLN
100.0000 mL | Freq: Once | INTRAVENOUS | Status: AC | PRN
  Administered 2014-05-22: 100 mL via INTRAVENOUS

## 2014-05-22 MED ORDER — HYDROMORPHONE HCL 1 MG/ML IJ SOLN
1.0000 mg | Freq: Once | INTRAMUSCULAR | Status: AC
Start: 2014-05-22 — End: 2014-05-22
  Administered 2014-05-22: 1 mg via INTRAVENOUS
  Filled 2014-05-22: qty 1

## 2014-05-22 MED ORDER — ONDANSETRON 4 MG PO TBDP: 4.0000 mg | ORAL_TABLET | Freq: Three times a day (TID) | ORAL | Status: DC | PRN

## 2014-05-22 MED ORDER — PANTOPRAZOLE SODIUM 40 MG IV SOLR
40.0000 mg | Freq: Once | INTRAVENOUS | Status: AC
  Administered 2014-05-22: 40 mg via INTRAVENOUS
  Filled 2014-05-22: qty 40

## 2014-05-22 MED ORDER — ONDANSETRON HCL 4 MG/2ML IJ SOLN
4.0000 mg | Freq: Once | INTRAMUSCULAR | Status: AC
  Administered 2014-05-22: 4 mg via INTRAVENOUS

## 2014-05-22 MED ORDER — SODIUM CHLORIDE 0.9 % IV SOLN
INTRAVENOUS | Status: DC
  Administered 2014-05-22: 100 mL/h via INTRAVENOUS

## 2014-05-22 MED ORDER — HYDROMORPHONE HCL 1 MG/ML IJ SOLN
1.0000 mg | Freq: Once | INTRAMUSCULAR | Status: AC
  Administered 2014-05-22: 1 mg via INTRAVENOUS
  Filled 2014-05-22: qty 1

## 2014-05-22 MED ORDER — PROMETHAZINE HCL 25 MG PO TABS
25.0000 mg | ORAL_TABLET | Freq: Four times a day (QID) | ORAL | Status: DC | PRN
Start: 2014-05-22 — End: 2018-01-11

## 2014-05-22 NOTE — ED Notes (Signed)
Pt vomiting after dilaudid 1mg  , bed changed, fluid infusing without difficulty, Zofran repeated.

## 2014-05-22 NOTE — ED Provider Notes (Signed)
CSN: [id number]     Arrival date & time 05/22/14  4098 History  This chart was scribed for Vanetta Mulders, MD by Littie Deeds, ED Scribe. This patient was seen in room APA10/AP[facility]10 and the patient's care was started at 7:55 AM.       Chief Complaint  Patient presents with  . Chest Pain   Patient is a 34 y.o. female presenting with chest pain. The history is provided by the patient. No language interpreter was used.  Chest Pain Pain location:  Substernal area Pain quality: burning and dull   Pain radiates to:  Upper back and neck Pain radiates to the back: yes   Pain severity:  Severe Onset quality:  Gradual Duration:  12 hours Timing:  Intermittent Chronicity:  New Context: eating   Relieved by:  Nothing Worsened by:  Nothing tried Associated symptoms: abdominal pain, back pain, diaphoresis, headache, nausea, shortness of breath and vomiting   Associated symptoms: no cough and no fever    HPI Comments: Elizabeth Franklin is a 34 y.o. female with a hx of chronic HA and anxiety who presents to the Emergency Department complaining of gradual onset, intermittent, dull, burning lower substernal chest pain radiating to back and posterior neck that started last night around 8:00 PM, 1 hour after eating a [place] lettuce wrap. The pain is described as a 10/10 in severity. About 30 minutes after onset of the chest pain, patient reports feeling fatigued, diaphoretic and nauseated, and she had an episode of vomiting. She has had 3 episodes of vomiting total since onset of symptoms, and her nausea subsided after the final episode of vomiting. Patient reports that the chest pain radiated into her back around 2:00 AM, and that the back pain is constant. She also reports having sore throat for the past few days and a single episode of SOB upon waking up. She tried phenergan and ativan, but with no relief; she had some difficulty sleeping. Patient denies having similar symptoms in the past. She has had an  unofficial gallbladder US that turned out normal. Patient has allergies to codeine.  She has a PSHx of appendectomy 15 years ago as well as a laparoscopic bilateral salpingectomy and D&C September of last year. She had a fallopian tubes removed due to bleeding and spots noticed on the outside. Patient does not have normal menstrual periods, and she does have her ovaries.  PCP: Dr. Olena Leatherwood  Past Medical History  Diagnosis Date  . Chronic headaches   . Anxiety    Past Surgical History  Procedure Laterality Date  . Appendectomy    . Laparoscopic bilateral salpingectomy Bilateral 01/30/2014    Procedure: LAPAROSCOPIC BILATERAL SALPINGECTOMY;  Surgeon: Lazaro Arms, MD;  Location: [facility];  Service: Gynecology;  Laterality: Bilateral;  . Dilitation & currettage/hystroscopy with thermachoice ablation N/A 01/30/2014    Procedure: DILATATION & CURETTAGE/HYSTEROSCOPY WITH THERMACHOICE ABLATION;  Surgeon: Lazaro Arms, MD;  Location: [facility];  Service: Gynecology;  Laterality: N/A;  Total Ablation Therapy Time 9 minutes 5 seconds; 86-88 deg C   Family History  Problem Relation Age of Onset  . Hypertension Mother   . Mental illness Mother   . Heart failure Father   . Heart disease Father   . Hypertension Father   . Diabetes Brother   . Hyperlipidemia Brother   . Cancer Paternal Grandmother   . Diabetes Paternal Grandmother   . Heart disease Paternal Grandmother    History  Substance Use Topics  .  Smoking status: Former Smoker -- 0.25 packs/day for 1 years    Types: Cigarettes    Quit date: 01/26/1999  . Smokeless tobacco: Never Used  . Alcohol Use: Yes     Comment: occassional   OB History    No data available     Review of Systems  Constitutional: Positive for diaphoresis. Negative for fever and chills.  HENT: Positive for sore throat. Negative for congestion.   Eyes: Negative for visual disturbance.  Respiratory: Positive for shortness of breath. Negative for cough.    Cardiovascular: Positive for chest pain. Negative for leg swelling.  Gastrointestinal: Positive for nausea, vomiting and abdominal pain. Negative for diarrhea.  Genitourinary: Negative for dysuria.  Musculoskeletal: Positive for back pain and neck pain.  Skin: Negative for rash.  Neurological: Positive for headaches.  Hematological: Does not bruise/bleed easily.  Psychiatric/Behavioral: Negative for confusion.      Allergies  Codeine  Home Medications   Prior to Admission medications   Medication Sig Start Date End Date Taking? Authorizing Provider  aspirin-acetaminophen-caffeine (EXCEDRIN MIGRAINE) [phone] MG per tablet Take 1 tablet by mouth every 6 (six) hours as needed. Headache   Yes Historical Provider, MD  buPROPion (WELLBUTRIN XL) 300 MG 24 hr tablet Take 300 mg by mouth daily.   Yes Historical Provider, MD  ibuprofen (ADVIL,MOTRIN) 200 MG tablet Take 800 mg by mouth every 6 (six) hours as needed for moderate pain.   Yes Historical Provider, MD  loratadine (CLARITIN) 10 MG tablet Take 10 mg by mouth daily.   Yes Historical Provider, MD  LORazepam (ATIVAN) 1 MG tablet Take 1 mg by mouth at bedtime as needed for sleep. Sleep   Yes Historical Provider, MD  Multiple Vitamin (MULTIVITAMIN WITH MINERALS) TABS Take 1 tablet by mouth daily.   Yes Historical Provider, MD  ketorolac (TORADOL) 10 MG tablet Take 1 tablet (10 mg total) by mouth every 8 (eight) hours as needed. Patient not taking: Reported on 05/22/2014 01/30/14   Lazaro Arms, MD  ondansetron (ZOFRAN ODT) 4 MG disintegrating tablet Take 1 tablet (4 mg total) by mouth every 8 (eight) hours as needed. 05/22/14   Vanetta Mulders, MD  ondansetron (ZOFRAN) 8 MG tablet Take 1 tablet (8 mg total) by mouth every 8 (eight) hours as needed for nausea. Patient not taking: Reported on 05/22/2014 01/30/14   Lazaro Arms, MD  oxyCODONE-acetaminophen (ROXICET) 5-325 MG per tablet Take 1-2 tablets by mouth every 4 (four) hours as needed  for severe pain. Patient not taking: Reported on 05/22/2014 01/30/14   Lazaro Arms, MD  promethazine (PHENERGAN) 25 MG tablet Take 1 tablet (25 mg total) by mouth every 6 (six) hours as needed. 05/22/14   Vanetta Mulders, MD   BP 133/86 mmHg  Pulse 92  Temp(Src) 98.1 F (36.7 C) (Oral)  Resp 20  SpO2 100% Physical Exam  Constitutional: She is oriented to person, place, and time. She appears well-developed and well-nourished. No distress.  HENT:  Head: Normocephalic and atraumatic.  Mouth/Throat: Oropharynx is clear and moist. No oropharyngeal exudate.  Eyes: Conjunctivae and EOM are normal. Pupils are equal, round, and reactive to light.  Sclera clear. Pupils normal.  Neck: Neck supple.  Cardiovascular: Regular rhythm.  Tachycardia present.   No murmur heard. Borderline tachycardic.  Pulmonary/Chest: Effort normal and breath sounds normal. No respiratory distress. She has no wheezes. She has no rales. She exhibits no tenderness.  Abdominal: There is no tenderness.  Bowel sounds decreased.  Musculoskeletal: She  exhibits no edema.  No ankle swelling.  Neurological: She is alert and oriented to person, place, and time. No cranial nerve deficit.  Skin: Skin is warm and dry. No rash noted.  Psychiatric: She has a normal mood and affect. Her behavior is normal.  Nursing note and vitals reviewed.   ED Course  Procedures  DIAGNOSTIC STUDIES: Oxygen Saturation is 98% on room air, normal by my interpretation.    COORDINATION OF CARE: 8:05 AM-Discussed treatment plan which includes medications, labs, and CXR with pt at bedside and pt agreed to plan.    Labs Review Labs Reviewed  HEPATIC FUNCTION PANEL - Abnormal; Notable for the following:    AST 38 (*)    ALT 55 (*)    Total Bilirubin 1.8 (*)    Indirect Bilirubin 1.6 (*)    All other components within normal limits  D-DIMER, QUANTITATIVE - Abnormal; Notable for the following:    D-Dimer, Quant 0.54 (*)    All other components  within normal limits  CBC  BASIC METABOLIC PANEL  TROPONIN I  LIPASE, BLOOD   Results for orders placed or performed during the hospital encounter of 05/22/14  CBC  Result Value Ref Range   WBC 8.8 4.0 - 10.5 K/uL   RBC 4.52 3.87 - 5.11 MIL/uL   Hemoglobin 14.3 12.0 - 15.0 g/dL   HCT 69.6 29.5 - 28.4 %   MCV 92.5 78.0 - 100.0 fL   MCH 31.6 26.0 - 34.0 pg   MCHC 34.2 30.0 - 36.0 g/dL   RDW 13.2 44.0 - 10.2 %   Platelets 198 150 - 400 K/uL  Basic metabolic panel  Result Value Ref Range   Sodium 135 135 - 145 mmol/L   Potassium 3.8 3.5 - 5.1 mmol/L   Chloride 105 96 - 112 mEq/L   CO2 22 19 - 32 mmol/L   Glucose, Bld 96 70 - 99 mg/dL   BUN 18 6 - 23 mg/dL   Creatinine, Ser 7.25 0.50 - 1.10 mg/dL   Calcium 8.7 8.4 - 36.6 mg/dL   GFR calc non Af Amer >90 >90 mL/min   GFR calc Af Amer >90 >90 mL/min   Anion gap 8 5 - 15  Troponin I (MHP)  Result Value Ref Range   Troponin I <0.03 <0.031 ng/mL  Lipase, blood  Result Value Ref Range   Lipase 25 11 - 59 U/L  Hepatic function panel  Result Value Ref Range   Total Protein 7.3 6.0 - 8.3 g/dL   Albumin 4.3 3.5 - 5.2 g/dL   AST 38 (H) 0 - 37 U/L   ALT 55 (H) 0 - 35 U/L   Alkaline Phosphatase 50 39 - 117 U/L   Total Bilirubin 1.8 (H) 0.3 - 1.2 mg/dL   Bilirubin, Direct 0.2 0.0 - 0.3 mg/dL   Indirect Bilirubin 1.6 (H) 0.3 - 0.9 mg/dL  D-dimer, quantitative  Result Value Ref Range   D-Dimer, Quant 0.54 (H) 0.00 - 0.48 ug/mL-FEU     Imaging Review Ct Angio Chest Pe W/cm &/or Wo Cm  05/22/2014   CLINICAL DATA:  Midsternal chest pain per 12 hr  EXAM: CT ANGIOGRAPHY CHEST WITH CONTRAST  TECHNIQUE: Multidetector CT imaging of the chest was performed using the standard protocol during bolus administration of intravenous contrast. Multiplanar CT image reconstructions and MIPs were obtained to evaluate the vascular anatomy.  CONTRAST:  OMNIPAQUE IOHEXOL 350 MG/ML SOLN  COMPARISON:  None.  FINDINGS: No filling defects in  the  pulmonary arterial tree to suggest acute pulmonary thromboembolism.  13 mm right paratracheal lymph node on image 22.  Low lung volumes but clear.  No pneumothorax.  No pleural effusion.  No acute bony deformity.  Review of the MIP images confirms the above findings.  IMPRESSION: No evidence of acute pulmonary thromboembolism.  Single mildly enlarged mediastinal lymph node as described above. Inflammatory and malignant etiology cannot be excluded. Consider 3 month followup to ensure stability and/or resolution.   Electronically Signed   By: Maryclare BeanArt  Hoss M.D.   On: 05/22/2014 11:17   Dg Chest Port 1 View  05/22/2014   CLINICAL DATA:  Chest pain.  EXAM: PORTABLE CHEST - 1 VIEW  COMPARISON:  February 17, 2012.  FINDINGS: The heart size and mediastinal contours are within normal limits. Both lungs are clear. No pneumothorax or pleural effusion is noted. The visualized skeletal structures are unremarkable.  IMPRESSION: No acute cardiopulmonary abnormality seen.   Electronically Signed   By: Roque LiasJames  Green M.D.   On: 05/22/2014 08:20     EKG Interpretation   Date/Time:  Monday May 22 2014 07:42:09 EST Ventricular Rate:  105 PR Interval:  151 QRS Duration: 102 QT Interval:  391 QTC Calculation: 517 R Axis:   18 Text Interpretation:  Sinus tachycardia RSR' in V1 or V2, right VCD or RVH  Borderline T abnormalities, anterior leads Prolonged QT interval Baseline  wander in lead(s) V4 No significant change since last tracing Confirmed by  Tamari Redwine  MD, Vennesa Bastedo [phone]([id number]) on 05/22/2014 7:54:29 AM      MDM   Final diagnoses:  Chest pain  Gastritis    Workup for the chest pain was negative including CT angiogram of the chest no evidence of pulmonary embolus. No evidence of pneumonia no evidence of pulmonary edema. No evidence of an acute cardiac event. Chest CT did show an enlarged lymph node in the chest which will require follow-up in 3 months. Patient made aware of this. Patient's liver function tests do  show some mild of liver function elevation and mild elevation in the bilirubin. Patient did have an ultrasound done last [facility] [place] done by the ultrasound tech which was negative. For gallstones or any of gallbladder abnormalities. Did show a lot of food in the stomach which would be consistent with a slow transient time which be consistent with a file gastritis. Suspect that's what's going on here. Patient without any significant abdominal tenderness. No significant tenderness and right upper quadrant. No guarding.  We'll discharge home with Phenergan and Zofran and drinking plenty of fluids. And precautions.  I personally performed the services described in this documentation, which was scribed in my presence. The recorded information has been reviewed and is accurate.     Vanetta MuldersScott Kattleya Kuhnert, MD 05/22/14 [phone]

## 2014-05-22 NOTE — ED Notes (Signed)
Patient given discharge instruction, verbalized understand. IV removed, band aid applied. Patient ambulatory out of the department.  

## 2014-05-22 NOTE — Discharge Instructions (Signed)
Workup without any significant findings for the chest pain. The other symptoms seem to fit a viral gastritis. Take the Phenergan and Zofran as needed for the nausea and vomiting. Would go with the Zofran first and then supplemented with Phenergan. The Phenergan will make you sleepy. Drink plenty of fluids small amounts frequently. Return for any new or worse symptoms. As we discussed there was one enlarged lymph node on the chest CT follow-up of that in 3 months would be very important.

## 2014-05-22 NOTE — ED Notes (Signed)
Pt was at work last after eating had episode of  Chest pain,  One episode of vomiting, constant pain, pt left work early am, went home took phenergan, and ativan, without relief. Pt continues to have constant heaviness in center chest

## 2014-05-22 NOTE — ED Notes (Signed)
Patient vomited and linen had to be changed.

## 2014-05-22 NOTE — ED Notes (Signed)
Pt states pain has not radiated into back and between shoulders blades

## 2014-07-12 ENCOUNTER — Encounter (HOSPITAL_COMMUNITY): Payer: Self-pay

## 2014-07-12 ENCOUNTER — Ambulatory Visit (HOSPITAL_COMMUNITY): Payer: 59 | Attending: Orthopedic Surgery | Admitting: Occupational Therapy

## 2014-07-12 DIAGNOSIS — M25649 Stiffness of unspecified hand, not elsewhere classified: Secondary | ICD-10-CM

## 2014-07-12 DIAGNOSIS — M79645 Pain in left finger(s): Secondary | ICD-10-CM

## 2014-07-12 NOTE — Therapy (Signed)
[facility] [address] [place], [place], [address] Phone: [phone]   Fax:  [phone]  Occupational Therapy Evaluation  Patient Details  Name: Elizabeth Franklin MRN: [id number] Date of Birth: 13-Jul-1980 Referring Provider:  Sharma Covertrtmann, Fred W, MD  Encounter Date: 07/12/2014      OT End of Session - 07/12/14 1634    Visit Number 1   Number of Visits 1   Authorization Type UMR   OT Start Time 1435   OT Stop Time 1515   OT Time Calculation (min) 40 min   Activity Tolerance Patient tolerated treatment well   Behavior [facility] tasks assessed/performed      Past Medical History  Diagnosis Date  . Chronic headaches   . Anxiety     Past Surgical History  Procedure Laterality Date  . Appendectomy    . Laparoscopic bilateral salpingectomy Bilateral 01/30/2014    Procedure: LAPAROSCOPIC BILATERAL SALPINGECTOMY;  Surgeon: Lazaro ArmsLuther H Eure, MD;  Location: [facility];  Service: Gynecology;  Laterality: Bilateral;  . Dilitation & currettage/hystroscopy with thermachoice ablation N/A 01/30/2014    Procedure: DILATATION & CURETTAGE/HYSTEROSCOPY WITH THERMACHOICE ABLATION;  Surgeon: Lazaro ArmsLuther H Eure, MD;  Location: [facility];  Service: Gynecology;  Laterality: N/A;  Total Ablation Therapy Time 9 minutes 5 seconds; 86-88 deg C    There were no vitals taken for this visit.  Visit Diagnosis:  Pain in finger of left hand - Plan: Ot plan of care cert/re-cert  Decreased range of motion of finger - Plan: Ot plan of care cert/re-cert      Subjective Assessment - 07/12/14 1624    Pertinent History Pt is a 34 y/o female with pain in her left ring finger PIP joint. Pt originally injured her finger approximately 1 month ago during crossfit. Pt reports pain varies between sharp, burning, aching, as well as feeling numb on the medial side of her ring finger at times. Dr. Melvyn Novasrtmann referred patient for occupational therapy evaluation and splint fabrication.     Patient Stated Goals To be able to use my finger without pain.    Currently in Pain? No/denies   Multiple Pain Sites No          [facility] OT Assessment - 07/12/14 1628    Assessment   Diagnosis left ring finger PIP splint   Precautions   Precautions None   Balance Screen   Has the patient had a decrease in activity level because of a fear of falling?  No   Is the patient reluctant to leave their home because of a fear of falling?  No   Home  Environment   Family/patient expects to be discharged to: Private residence   Prior Function   Level of Independence Independent with basic ADLs   Vocation Full time employment   Vocation Requirements Using hands during majority of workday. Pt is Dentistx-ray technician. Lifting, carrying, manipulating objects.    ADL   ADL comments Pt is able to complete all ADLs/IADLs, however has pain when finger hits an object or is moved in certain positions. Changing sheets is difficult and painful when pt's finger is caught in sheets.    Written Expression   Dominant Hand Right   Cognition   Overall Cognitive Status Within Functional Limits for tasks assessed   Edema   Edema slight edema noted in left ring finger, proximal to PIP joint.  OT Education - 07/12/14 1633    Education provided Yes   Education Details Splint wear & care instructions; edema reduction techniques including elevation, retrograde massage, coban wrapping, and contrast bath.    Person(s) Educated Patient   Methods Explanation;Demonstration;Handout   Comprehension Verbalized understanding          OT Short Term Goals - 07/12/14 1639    OT SHORT TERM GOAL #1   Title Patient will be fitted for and provided with finger based gutter splint for left ring finger.    Time 1   Period Days   Status Achieved   OT SHORT TERM GOAL #2   Title Patient will be educated on splint wear/care instructions and edema reduction techniques.    Time 1   Period  Days   Status Achieved                  Plan - 07/12/14 1635    Clinical Impression Statement A: Fabricated finger based gutter splint for left ring finger. Provided patient with splint wear and care instructions along with edema reduction techniques. OT will contact patient on Friday 07-14-14 to follow up on fit and effectivenss of splint.    Pt will benefit from skilled therapeutic intervention in order to improve on the following deficits (Retired) Pain   Rehab Potential Good   OT Frequency --  1 visit   OT Treatment/Interventions Splinting   Plan P: Follow up with patient on splint on 07-14-14   Consulted and Agree with Plan of Care Patient        Problem List Patient Active Problem List   Diagnosis Date Noted  . Excessive or frequent menstruation 11/21/2013  . Dysmenorrhea 11/21/2013    Ezra Sites, OTR/L [phone]  07/12/2014, 4:47 PM  [facility] [address] [place], [place], [address] Phone: [phone]   Fax:  [phone]

## 2014-07-12 NOTE — Patient Instructions (Signed)
Your Splint This splint should initially be fitted by a healthcare practitioner.  The healthcare practitioner is responsible for providing wearing instructions and precautions to the patient, other healthcare practitioners and care provider involved in the patient's care.  This splint was custom made for you. Please read the following instructions to learn about wearing and caring for your splint.  Precautions Should your splint cause any of the following problems, remove the splint immediately and contact your therapist/physician.  Swelling  Severe Pain  Pressure Areas  Stiffness  Numbness  Do not wear your splint while operating machinery unless it has been fabricated for that purpose.  When To Wear Your Splint Where your splint according to your therapist/physician instruction: All the time during daily activities.   Care and Cleaning of Your Splint 1. Keep your splint away from open flames. 2. Your splint will lose its shape in temperatures over 135 degrees Farenheit, ( in car windows, near radiators, ovens or in hot water).  Never make any adjustments to your splint, if the splint needs adjusting remove it and make an appointment to see your therapist. 3. Your splint, including the cushion liner may be cleaned with soap and lukewarm water.  Do not immerse in hot water over 135 degrees Farenheit. 4. Straps may be washed with soap and water, but do not moisten the self-adhesive portion. 5. For ink or hard to remove spots use a scouring cleanser which contains chlorine.  Rinse the splint thoroughly after using chlorine cleanser.  Edema Reduction Techniques:   -Elevation: Your hand needs to be above the level of your heart. Use pillows at night to prop up your arm and hand. During the day, keep your hand elevated as much as possible - not down swinging at your side. - If you are able to, hold your arm straight up, overhead and gently wiggle your fingers for 1 minute. Do this  every 1 to 2 hours when you are awake.  -Retrograde Massage: Retrograde massage should be performed with the hand elevated. Encircle your fingers and stroke the fingers from the tip down through the hand and past the wrist. Follow the massage with active range of motion exercises as given to you by your therapist. Otherwise, make a fist then open as far as you can without pain 10 Times.  Contrast Baths: Put hot (100-105 degrees) water in a container large enough to fit your hand or a sink. Fill a second container with cold (55-65 degrees) water. - Place your hand in the hot water for four minutes, then cold water for one minute. - Do this for four cycles ending with cold water. - Then dry your hand and use a small amount of hand lotion on your hand to perform retrograde massage.  -Coban Wrapping: Starting at the tip of your finger, wrap closely and firmly around the finger. Leave a small opening at the tip so you can check to color of your finger. Wrapping should progress from the tip down until all of the involved fingers are wrapped. Complete as instructed by your therapist. Take care not to not too wrap too tightly so it doesn't cut off the blood flow.

## 2014-07-17 ENCOUNTER — Ambulatory Visit (HOSPITAL_COMMUNITY): Payer: 59 | Admitting: Occupational Therapy

## 2014-09-30 ENCOUNTER — Other Ambulatory Visit (HOSPITAL_BASED_OUTPATIENT_CLINIC_OR_DEPARTMENT_OTHER): Payer: Self-pay | Admitting: Internal Medicine

## 2014-09-30 DIAGNOSIS — R599 Enlarged lymph nodes, unspecified: Secondary | ICD-10-CM

## 2014-10-01 ENCOUNTER — Ambulatory Visit (HOSPITAL_BASED_OUTPATIENT_CLINIC_OR_DEPARTMENT_OTHER)
Admission: RE | Admit: 2014-10-01 | Discharge: 2014-10-01 | Disposition: A | Discharge status: Home / Self Care | Payer: 59 | Source: Ambulatory Visit | Attending: Internal Medicine | Admitting: Internal Medicine

## 2014-10-01 DIAGNOSIS — R5383 Other fatigue: Secondary | ICD-10-CM | POA: Insufficient documentation

## 2014-10-01 DIAGNOSIS — R0602 Shortness of breath: Secondary | ICD-10-CM | POA: Diagnosis present

## 2014-10-01 DIAGNOSIS — R079 Chest pain, unspecified: Secondary | ICD-10-CM | POA: Insufficient documentation

## 2014-10-01 DIAGNOSIS — R0789 Other chest pain: Secondary | ICD-10-CM | POA: Insufficient documentation

## 2014-10-01 DIAGNOSIS — R599 Enlarged lymph nodes, unspecified: Secondary | ICD-10-CM

## 2014-10-01 MED ORDER — IOHEXOL 300 MG/ML  SOLN
80.0000 mL | Freq: Once | INTRAMUSCULAR | Status: AC | PRN
  Administered 2014-10-01: 80 mL via INTRAVENOUS

## 2014-10-09 ENCOUNTER — Ambulatory Visit (INDEPENDENT_AMBULATORY_CARE_PROVIDER_SITE_OTHER): Payer: 59 | Admitting: Family Medicine

## 2014-10-09 VITALS — BP 122/88 | HR 97 | Temp 98.6°F | Resp 16 | Ht 69.0 in | Wt 207.6 lb

## 2014-10-09 DIAGNOSIS — H5713 Ocular pain, bilateral: Secondary | ICD-10-CM | POA: Diagnosis not present

## 2014-10-09 DIAGNOSIS — Z973 Presence of spectacles and contact lenses: Secondary | ICD-10-CM | POA: Diagnosis not present

## 2014-10-09 MED ORDER — OFLOXACIN 0.3 % OP SOLN: OPHTHALMIC | Status: DC

## 2014-10-09 NOTE — Progress Notes (Signed)
  Subjective:  Patient ID: Elizabeth Franklin, female    DOB: [birth year]  Age: 34 y.o. MRN: [id number]  34 year old lady who works long shifts on the weekend. Apparently her eyes started hurting her on Friday, right worse than left. She did not get her contacts out until she got home on Saturday evening. She actually with that eye pain yesterday. She came in today. She usually wears the contacts too long she says. She has not had any eye infections or trauma. She just complains of photophobia and pain in her eyes. Denies any other infections. There are some concern about some mediastinal nodes but those have gone away. Otherwise is healthy. 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   Objective:   Too photophobic to see an ice without metastatic drops. She kept squeezing them tightly closed. Metastatic drops were used in the routine fashion. She still continued to spread her eyes tightly closed. They were used a second time. She finally will allow me to spread the eye lids apart. The eyes were examined and no gross anomalies could be seen. The conjunctiva is a little red on the right. No obvious abrasions. Attempts were made to examine the fundi with funduscope, but never could get excellent views. Red reflex is good bilaterally. No foreign objects were noted under the lids. Fluorescein staining showed a minimal area of uptake on the medial aspect of the right cornea at about the 3:30 position. Repeated attempts were made with the ophthalmoscope but still I was unable to get a good view. The pain gradually eased up and she would open her eyes but was very anxious whenever attempts were made to examine. Exam took about 40 minutes.  Assessment & Plan:   Assessment: Possible mild right corneal abrasion in patient with foot photophobia, eye pain, mild conjunctivitis on the right, and probable overuse of contacts  Plan: Patient Instructions  Use of ofloxacin eyedrops 1 drop each eye every 2-3 hours today,  then decrease to 4 times daily tomorrow.   If you continue to have a lot of discomfort but tomorrow go to your eye doctor.   If you're getting acutely worse today go on to the emergency room.  Avoid using makeup and do not wear you contacts until you're eyes are feeling normal  Take ibuprofen 800 mg 3 times daily for Aleve (naproxen) 440 mg twice daily if needed for pain  Stay in a dark room today because light will irritate tries worse     HOPPER,DAVID, MD 10/09/2014

## 2014-10-09 NOTE — Patient Instructions (Signed)
Use of ofloxacin eyedrops 1 drop each eye every 2-3 hours today, then decrease to 4 times daily tomorrow.   If you continue to have a lot of discomfort but tomorrow go to your eye doctor.   If you're getting acutely worse today go on to the emergency room.  Avoid using makeup and do not wear you contacts until you're eyes are feeling normal  Take ibuprofen 800 mg 3 times daily for Aleve (naproxen) 440 mg twice daily if needed for pain  Stay in a dark room today because light will irritate tries worse

## 2015-05-23 DIAGNOSIS — G4730 Sleep apnea, unspecified: Secondary | ICD-10-CM | POA: Diagnosis not present

## 2015-07-30 DIAGNOSIS — R5383 Other fatigue: Secondary | ICD-10-CM | POA: Diagnosis not present

## 2015-07-30 DIAGNOSIS — M5489 Other dorsalgia: Secondary | ICD-10-CM | POA: Diagnosis not present

## 2015-07-30 DIAGNOSIS — Z6830 Body mass index (BMI) 30.0-30.9, adult: Secondary | ICD-10-CM | POA: Diagnosis not present

## 2015-08-13 ENCOUNTER — Encounter (HOSPITAL_COMMUNITY): Payer: Self-pay

## 2015-09-11 DIAGNOSIS — H5213 Myopia, bilateral: Secondary | ICD-10-CM | POA: Diagnosis not present

## 2015-09-11 DIAGNOSIS — H52223 Regular astigmatism, bilateral: Secondary | ICD-10-CM | POA: Diagnosis not present

## 2016-04-21 DIAGNOSIS — H527 Unspecified disorder of refraction: Secondary | ICD-10-CM | POA: Diagnosis not present

## 2016-04-21 DIAGNOSIS — S0532XA Ocular laceration without prolapse or loss of intraocular tissue, left eye, initial encounter: Secondary | ICD-10-CM | POA: Diagnosis not present

## 2018-01-11 ENCOUNTER — Ambulatory Visit (INDEPENDENT_AMBULATORY_CARE_PROVIDER_SITE_OTHER): Payer: Self-pay | Admitting: Nurse Practitioner

## 2018-01-11 ENCOUNTER — Encounter: Payer: Self-pay | Admitting: Nurse Practitioner

## 2018-01-11 VITALS — BP 128/84 | HR 80 | Temp 98.3°F | Resp 16 | Ht 68.0 in | Wt 219.2 lb

## 2018-01-11 DIAGNOSIS — J029 Acute pharyngitis, unspecified: Secondary | ICD-10-CM

## 2018-01-11 LAB — POCT RAPID STREP A (OFFICE): Rapid Strep A Screen: NEGATIVE

## 2018-01-11 MED ORDER — AMOXICILLIN 875 MG PO TABS: 875.0000 mg | ORAL_TABLET | Freq: Two times a day (BID) | ORAL | 0 refills | Status: AC

## 2018-01-11 NOTE — Progress Notes (Signed)
Subjective:     Elizabeth Franklin is a 37 y.o. female who presents for evaluation of sore throat. Associated symptoms include dry cough, headache, nasal blockage, post nasal drip, sinus and nasal congestion, sore throat and swollen glands.  States the headache has caused her to have some sensitivity to light.  Onset of symptoms was 6 days ago, and have been gradually worsening since that time. She is drinking plenty of fluids. She is unsure if she has had a recent close exposure to someone with proven streptococcal pharyngitis.  Patient states she has been taking over-the-counter cough and cold medicine, ibuprofen, and Excedrin.  The following portions of the patient's history were reviewed and updated as appropriate: allergies, current medications and past medical history.  Review of Systems Constitutional: positive for anorexia, fatigue and malaise, negative for night sweats, sweats and weight loss Eyes: negative Ears, nose, mouth, throat, and face: positive for nasal congestion, sore throat and bilateral ear pressure, negative for ear drainage and earaches Respiratory: positive for cough, negative for asthma, chronic bronchitis, hemoptysis, pneumonia, sputum, stridor and wheezing Cardiovascular: negative Gastrointestinal: positive for nausea and decreased appetite, negative for abdominal pain, change in bowel habits, constipation and diarrhea Neurological: positive for headaches, negative for coordination problems, dizziness, paresthesia, seizures, speech problems, tremors, vertigo and weakness    Objective:    BP 128/84 (BP Location: Right Arm, Patient Position: Sitting, Cuff Size: Normal)   Pulse 80   Temp 98.3 F (36.8 C) (Oral)   Resp 16   Ht 5\' 8"  (1.727 m)   Wt 219 lb 3.2 oz (99.4 kg)   SpO2 97%   BMI 33.33 kg/m  General appearance: alert, cooperative, fatigued and no distress Head: Normocephalic, without obvious abnormality, atraumatic Eyes: conjunctivae/corneas clear. PERRL,  EOM's intact. Fundi benign. Ears: normal TM's and external ear canals both ears Nose: no discharge, turbinates swollen, inflamed, no maxillary sinus tenderness bilateral, mild frontal sinus tenderness bilateral Throat: abnormal findings: moderate oropharyngeal erythema and +1 tonsils bilaterally, + exudate bilaterally Lungs: clear to auscultation bilaterally Heart: regular rate and rhythm, S1, S2 normal, no murmur, click, rub or gallop Abdomen: soft, non-tender; bowel sounds normal; no masses,  no organomegaly Pulses: 2+ and symmetric Skin: Skin color, texture, turgor normal. No rashes or lesions Lymph nodes: cervical lymphadenopathy bilaterally Neurologic: Grossly normal  Laboratory Strep test done. Results:negative.    Assessment:  Acute Pharyngitis   Plan:    Exam findings, diagnosis etiology and medication use and indications reviewed with patient. Follow- Up and discharge instructions provided. No emergent/urgent issues found on exam.  Patient education was provided. Patient verbalized understanding of information provided and agrees with plan of care (POC), all questions answered. The patient is advised to call or return to clinic if he does not see an improvement in symptoms, or to seek the care of the closest emergency department if he worsens with the above plan.   1. Sore throat  - POCT rapid strep A - amoxicillin (AMOXIL) 875 MG tablet; Take 1 tablet (875 mg total) by mouth 2 (two) times daily for 10 days.  Dispense: 20 tablet; Refill: 0 -Take medication as prescribed. -Ibuprofen 800mg  every 8 hours for 3 days.  Take with food and water. -Warm saltwater gargles. -Increase fluids. -Follow up in the ER is you are unable to swallow, have difficulty breathing or begin drooling.  2. Acute pharyngitis, unspecified etiology  - amoxicillin (AMOXIL) 875 MG tablet; Take 1 tablet (875 mg total) by mouth 2 (two) times daily  for 10 days.  Dispense: 20 tablet; Refill: 0 -Take  medication as prescribed. -Ibuprofen 800mg  every 8 hours for 3 days.  Take with food and water. -Warm saltwater gargles. -Increase fluids. -Follow up in the ER is you are unable to swallow, have difficulty breathing or begin drooling.

## 2018-01-11 NOTE — Patient Instructions (Signed)
Pharyngitis -Take medication as prescribed. -Ibuprofen 800mg  every 8 hours for 3 days.  Take with food and water. -Warm saltwater gargles. -Increase fluids. -Follow up in the ER is you are unable to swallow, have difficulty breathing or begin drooling.   Pharyngitis is redness, pain, and swelling (inflammation) of the throat (pharynx). It is a very common cause of sore throat. Pharyngitis can be caused by a bacteria, but it is usually caused by a virus. Most cases of pharyngitis get better on their own without treatment. What are the causes? This condition may be caused by:  Infection by viruses (viral). Viral pharyngitis spreads from person to person (is contagious) through coughing, sneezing, and sharing of personal items or utensils such as cups, forks, spoons, and toothbrushes.  Infection by bacteria (bacterial). Bacterial pharyngitis may be spread by touching the nose or face after coming in contact with the bacteria, or through more intimate contact, such as kissing.  Allergies. Allergies can cause buildup of mucus in the throat (post-nasal drip), leading to inflammation and irritation. Allergies can also cause blocked nasal passages, forcing breathing through the mouth, which dries and irritates the throat.  What increases the risk? You are more likely to develop this condition if:  You are 41-43 years old.  You are exposed to crowded environments such as daycare, school, or dormitory living.  You live in a cold climate.  You have a weakened disease-fighting (immune) system.  What are the signs or symptoms? Symptoms of this condition vary by the cause (viral, bacterial, or allergies) and can include:  Sore throat.  Fatigue.  Low-grade fever.  Headache.  Joint pain and muscle aches.  Skin rashes.  Swollen glands in the throat (lymph nodes).  Plaque-like film on the throat or tonsils. This is often a symptom of bacterial pharyngitis.  Vomiting.  Stuffy nose  (nasal congestion).  Cough.  Red, itchy eyes (conjunctivitis).  Loss of appetite.  How is this diagnosed? This condition is often diagnosed based on your medical history and a physical exam. Your health care provider will ask you questions about your illness and your symptoms. A swab of your throat may be done to check for bacteria (rapid strep test). Other lab tests may also be done, depending on the suspected cause, but these are rare. How is this treated? This condition usually gets better in 3-4 days without medicine. Bacterial pharyngitis may be treated with antibiotic medicines. Follow these instructions at home:  Take over-the-counter and prescription medicines only as told by your health care provider. ? If you were prescribed an antibiotic medicine, take it as told by your health care provider. Do not stop taking the antibiotic even if you start to feel better. ? Do not give children aspirin because of the association with Reye syndrome.  Drink enough water and fluids to keep your urine clear or pale yellow.  Get a lot of rest.  Gargle with a salt-water mixture 3-4 times a day or as needed. To make a salt-water mixture, completely dissolve -1 tsp of salt in 1 cup of warm water.  If your health care provider approves, you may use throat lozenges or sprays to soothe your throat. Contact a health care provider if:  You have large, tender lumps in your neck.  You have a rash.  You cough up green, yellow-brown, or bloody spit. Get help right away if:  Your neck becomes stiff.  You drool or are unable to swallow liquids.  You cannot drink or take  medicines without vomiting.  You have severe pain that does not go away, even after you take medicine.  You have trouble breathing, and it is not caused by a stuffy nose.  You have new pain and swelling in your joints such as the knees, ankles, wrists, or elbows. Summary  Pharyngitis is redness, pain, and swelling  (inflammation) of the throat (pharynx).  While pharyngitis can be caused by a bacteria, the most common causes are viral.  Most cases of pharyngitis get better on their own without treatment.  Bacterial pharyngitis is treated with antibiotic medicines. This information is not intended to replace advice given to you by your health care provider. Make sure you discuss any questions you have with your health care provider. Document Released: 04/28/2005 Document Revised: 06/03/2016 Document Reviewed: 06/03/2016 Elsevier Interactive Patient Education  [employer].

## 2018-06-18 ENCOUNTER — Other Ambulatory Visit (HOSPITAL_COMMUNITY): Payer: Self-pay | Admitting: Internal Medicine

## 2018-06-18 ENCOUNTER — Other Ambulatory Visit: Payer: Self-pay | Admitting: Internal Medicine

## 2018-06-18 DIAGNOSIS — M542 Cervicalgia: Secondary | ICD-10-CM

## 2018-06-28 ENCOUNTER — Ambulatory Visit (HOSPITAL_COMMUNITY)
Admission: RE | Admit: 2018-06-28 | Discharge: 2018-06-28 | Disposition: A | Discharge status: Home / Self Care | Payer: No Typology Code available for payment source | Source: Ambulatory Visit | Attending: Internal Medicine | Admitting: Internal Medicine

## 2018-06-28 DIAGNOSIS — M542 Cervicalgia: Secondary | ICD-10-CM | POA: Diagnosis not present

## 2018-10-11 ENCOUNTER — Telehealth (HOSPITAL_COMMUNITY): Payer: Self-pay

## 2018-10-18 ENCOUNTER — Other Ambulatory Visit: Payer: Self-pay

## 2018-10-18 ENCOUNTER — Encounter (HOSPITAL_COMMUNITY): Payer: Self-pay | Admitting: Emergency Medicine

## 2018-10-18 ENCOUNTER — Emergency Department (HOSPITAL_COMMUNITY): Payer: No Typology Code available for payment source

## 2018-10-18 ENCOUNTER — Emergency Department (HOSPITAL_COMMUNITY)
Admission: EM | Admit: 2018-10-18 | Discharge: 2018-10-18 | Disposition: A | Discharge status: Home / Self Care | Payer: No Typology Code available for payment source | Attending: Emergency Medicine | Admitting: Emergency Medicine

## 2018-10-18 DIAGNOSIS — G44319 Acute post-traumatic headache, not intractable: Secondary | ICD-10-CM | POA: Diagnosis not present

## 2018-10-18 DIAGNOSIS — Y999 Unspecified external cause status: Secondary | ICD-10-CM | POA: Insufficient documentation

## 2018-10-18 DIAGNOSIS — Y939 Activity, unspecified: Secondary | ICD-10-CM | POA: Insufficient documentation

## 2018-10-18 DIAGNOSIS — I7774 Dissection of vertebral artery: Secondary | ICD-10-CM | POA: Diagnosis not present

## 2018-10-18 DIAGNOSIS — S15102A Unspecified injury of left vertebral artery, initial encounter: Secondary | ICD-10-CM | POA: Diagnosis not present

## 2018-10-18 DIAGNOSIS — Z87891 Personal history of nicotine dependence: Secondary | ICD-10-CM | POA: Diagnosis not present

## 2018-10-18 DIAGNOSIS — Z79899 Other long term (current) drug therapy: Secondary | ICD-10-CM | POA: Insufficient documentation

## 2018-10-18 DIAGNOSIS — S0990XA Unspecified injury of head, initial encounter: Secondary | ICD-10-CM

## 2018-10-18 DIAGNOSIS — W108XXA Fall (on) (from) other stairs and steps, initial encounter: Secondary | ICD-10-CM | POA: Insufficient documentation

## 2018-10-18 DIAGNOSIS — Y929 Unspecified place or not applicable: Secondary | ICD-10-CM | POA: Insufficient documentation

## 2018-10-18 LAB — I-STAT CHEM 8, ED
BUN: 10 mg/dL (ref 6–20)
Calcium, Ion: 1.1 mmol/L — ABNORMAL LOW (ref 1.15–1.40)
Chloride: 106 mmol/L (ref 98–111)
Creatinine, Ser: 1 mg/dL (ref 0.44–1.00)
Glucose, Bld: 84 mg/dL (ref 70–99)
HCT: 39 % (ref 36.0–46.0)
Hemoglobin: 13.3 g/dL (ref 12.0–15.0)
Potassium: 3.8 mmol/L (ref 3.5–5.1)
Sodium: 141 mmol/L (ref 135–145)
TCO2: 23 mmol/L (ref 22–32)

## 2018-10-18 MED ORDER — ONDANSETRON 8 MG PO TBDP
8.0000 mg | ORAL_TABLET | Freq: Once | ORAL | Status: AC
  Administered 2018-10-18: 8 mg via ORAL
  Filled 2018-10-18: qty 1

## 2018-10-18 MED ORDER — ONDANSETRON 4 MG PO TBDP
4.0000 mg | ORAL_TABLET | Freq: Once | ORAL | Status: AC
  Administered 2018-10-18: 05:00:00 4 mg via ORAL
  Filled 2018-10-18: qty 1

## 2018-10-18 MED ORDER — OXYCODONE-ACETAMINOPHEN 5-325 MG PO TABS: ORAL_TABLET | ORAL | 0 refills | Status: DC

## 2018-10-18 MED ORDER — OXYCODONE-ACETAMINOPHEN 5-325 MG PO TABS
2.0000 | ORAL_TABLET | Freq: Once | ORAL | Status: AC
  Administered 2018-10-18: 2 via ORAL
  Filled 2018-10-18: qty 2

## 2018-10-18 MED ORDER — ONDANSETRON 4 MG PO TBDP: 4.0000 mg | ORAL_TABLET | Freq: Three times a day (TID) | ORAL | 0 refills | Status: DC | PRN

## 2018-10-18 MED ORDER — METOCLOPRAMIDE HCL 10 MG PO TABS
10.0000 mg | ORAL_TABLET | Freq: Once | ORAL | Status: AC
  Administered 2018-10-18: 10 mg via ORAL
  Filled 2018-10-18: qty 1

## 2018-10-18 MED ORDER — ASPIRIN 81 MG PO CHEW
81.0000 mg | CHEWABLE_TABLET | Freq: Once | ORAL | Status: AC
  Administered 2018-10-18: 81 mg via ORAL
  Filled 2018-10-18: qty 1

## 2018-10-18 MED ORDER — IOHEXOL 350 MG/ML SOLN
100.0000 mL | Freq: Once | INTRAVENOUS | Status: AC | PRN
  Administered 2018-10-18: 75 mL via INTRAVENOUS

## 2018-10-18 MED ORDER — DIPHENHYDRAMINE HCL 25 MG PO CAPS
25.0000 mg | ORAL_CAPSULE | Freq: Once | ORAL | Status: AC
  Administered 2018-10-18: 05:00:00 25 mg via ORAL
  Filled 2018-10-18: qty 1

## 2018-10-18 MED ORDER — KETOROLAC TROMETHAMINE 60 MG/2ML IM SOLN
30.0000 mg | Freq: Once | INTRAMUSCULAR | Status: AC
  Administered 2018-10-18: 30 mg via INTRAMUSCULAR
  Filled 2018-10-18: qty 2

## 2018-10-18 NOTE — ED Notes (Signed)
Patient would like something for pain at this time. RN notified.

## 2018-10-18 NOTE — ED Triage Notes (Signed)
Pt C/o headache and left sided neck pain after a fall today. Pt states she has tried OTC mediation with no relief.

## 2018-10-18 NOTE — ED Notes (Signed)
Patient transported to CT 

## 2018-10-18 NOTE — ED Notes (Signed)
EDP made aware of pt's pain medication request, EDP states she will talk with pt

## 2018-10-18 NOTE — ED Notes (Signed)
ED Provider at bedside. 

## 2018-10-18 NOTE — Discharge Instructions (Addendum)
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 This is not a surgical condition at this time. Start taking an aspirin 81mg  by mouth daily. Have an adult stay with you to observe you for the next 24 hours. No gym or sports until seen in follow up. No manipulation of your neck/head until you are seen in follow up. Take the prescriptions as directed.  Call your regular medical doctor today to schedule a follow up appointment within the next 2 days. Call the Vascular Surgeon today to schedule a follow up appointment within the next week.  Return to the Emergency Department immediately if worsening; have uncontrolled vomiting; you develop any visual, speech, motor or sensory changes; you experience difficulty walking; or for any other concerns.

## 2018-10-18 NOTE — ED Provider Notes (Signed)
[facility] Provider Note   CSN: [id number] Arrival date & time: 10/18/18  0425    History   Chief Complaint Chief Complaint  Patient presents with   Headache    HPI Elizabeth Franklin is a 38 y.o. female.     Patient with history of chronic headaches as well cervical spine disc surgery on March 2020 by Dr. Dutch QuintPoole.  States she has used to a daily diffuse headache behind her eyes that usually resolves with ibuprofen.  States she had a fall yesterday afternoon down about 2 steps underwent a hard wooden surface.  She states she tensed up to protect her neck but does not believe that she hit her head.  However since then she has had severe left-sided headache that is "pulsing" as well as worsening of her neck pain.  This headache is different than her usual chronic headaches and is not improving with ibuprofen.  She denies losing consciousness.  She has had nausea but no vomiting.  She denies any new weakness, numbness or tingling.  She denies any back pain or abdominal pain.  Leftover hydrocodone at home without relief.  She denies headache preceding her fall.  States the headache was there after she fell but she is not certain that she hit her head.  No fevers or vomiting.   Headache  Associated symptoms: myalgias and neck pain   Associated symptoms: no abdominal pain, no congestion, no cough, no fever, no nausea, no photophobia, no seizures, no vomiting and no weakness     Past Medical History:  Diagnosis Date   Anxiety    Chronic headaches     Patient Active Problem List   Diagnosis Date Noted   Excessive or frequent menstruation 11/21/2013   Dysmenorrhea 11/21/2013    Past Surgical History:  Procedure Laterality Date   APPENDECTOMY     CERVICAL DISC SURGERY     DILITATION & CURRETTAGE/HYSTROSCOPY WITH THERMACHOICE ABLATION N/A 01/30/2014   Procedure: DILATATION & CURETTAGE/HYSTEROSCOPY WITH THERMACHOICE ABLATION;  Surgeon: Lazaro ArmsLuther H Eure, MD;   Location: [facility];  Service: Gynecology;  Laterality: N/A;  Total Ablation Therapy Time 9 minutes 5 seconds; 86-88 deg C   LAPAROSCOPIC BILATERAL SALPINGECTOMY Bilateral 01/30/2014   Procedure: LAPAROSCOPIC BILATERAL SALPINGECTOMY;  Surgeon: Lazaro ArmsLuther H Eure, MD;  Location: [facility];  Service: Gynecology;  Laterality: Bilateral;     OB History   No obstetric history on file.      Home Medications    Prior to Admission medications   Medication Sig Start Date End Date Taking? Authorizing Provider  aspirin-acetaminophen-caffeine (EXCEDRIN MIGRAINE) [phone] MG per tablet Take 1 tablet by mouth every 6 (six) hours as needed. Headache    [provider]  ibuprofen (ADVIL,MOTRIN) 200 MG tablet Take 800 mg by mouth every 6 (six) hours as needed for moderate pain.    [provider]  Multiple Vitamin (MULTIVITAMIN WITH MINERALS) TABS Take 1 tablet by mouth daily.    [provider]  ofloxacin (OCUFLOX) 0.3 % ophthalmic solution Use 1 or 2 drops in each eye every 2-3 hours today, then decrease to 4 times daily 10/09/14   Peyton NajjarHopper, David H, MD    Family History Family History  Problem Relation Age of Onset   Hypertension Mother    Mental illness Mother    Heart failure Father    Heart disease Father    Hypertension Father    Diabetes Brother    Hyperlipidemia Brother    Cancer Paternal Grandmother  Diabetes Paternal Grandmother    Heart disease Paternal Grandmother     Social History Social History   Tobacco Use   Smoking status: Former Smoker    Packs/day: 0.25    Years: 1.00    Pack years: 0.25    Types: Cigarettes    Last attempt to quit: 01/26/1999    Years since quitting: 19.7   Smokeless tobacco: Never Used  Substance Use Topics   Alcohol use: Yes    Comment: occassional   Drug use: No     Allergies   Codeine   Review of Systems Review of Systems  Constitutional: Negative for activity change, appetite change and fever.    HENT: Negative for congestion and rhinorrhea.   Eyes: Negative for photophobia and visual disturbance.  Respiratory: Negative for cough and shortness of breath.   Cardiovascular: Negative for chest pain.  Gastrointestinal: Negative for abdominal pain, nausea and vomiting.  Genitourinary: Negative for dysuria, hematuria, vaginal bleeding and vaginal discharge.  Musculoskeletal: Positive for arthralgias, myalgias and neck pain.  Skin: Negative for rash.  Neurological: Positive for headaches. Negative for seizures, speech difficulty and weakness.   all other systems are negative except as noted in the HPI and PMH.     Physical Exam Updated Vital Signs BP (!) 134/102    Pulse 80    Temp 98.4 F (36.9 C) (Oral)    Resp 20    Ht 5\' 8"  (1.727 m)    Wt 95.3 kg    SpO2 97%    BMI 31.93 kg/m   Physical Exam Vitals signs and nursing note reviewed.  Constitutional:      General: She is not in acute distress.    Appearance: Normal appearance. She is well-developed and normal weight.  HENT:     Head: Normocephalic and atraumatic.     Nose:     Comments: No septal hematoma or hemotypanum.     Mouth/Throat:     Pharynx: No oropharyngeal exudate.  Eyes:     Conjunctiva/sclera: Conjunctivae normal.     Pupils: Pupils are equal, round, and reactive to light.  Neck:     Musculoskeletal: Normal range of motion and neck supple.     Comments: No meningismus. Well healed cervical incision. Paraspinal cervical tenderness bilaterally Cardiovascular:     Rate and Rhythm: Normal rate and regular rhythm.     Heart sounds: Normal heart sounds. No murmur.  Pulmonary:     Effort: Pulmonary effort is normal. No respiratory distress.     Breath sounds: Normal breath sounds.  Abdominal:     Palpations: Abdomen is soft.     Tenderness: There is no abdominal tenderness. There is no guarding or rebound.  Musculoskeletal: Normal range of motion.        General: No tenderness or deformity.     Right lower  leg: No edema.  Skin:    General: Skin is warm.     Capillary Refill: Capillary refill takes less than 2 seconds.  Neurological:     General: No focal deficit present.     Mental Status: She is alert and oriented to person, place, and time. Mental status is at baseline.     Cranial Nerves: No cranial nerve deficit.     Motor: No abnormal muscle tone.     Coordination: Coordination normal.     Comments: CN 2-12 intact, no ataxia on finger to nose, no nystagmus, 5/5 strength throughout, no pronator drift, Romberg negative, normal gait.  Psychiatric:        Behavior: Behavior normal.      ED Treatments / Results  Labs (all labs ordered are listed, but only abnormal results are displayed) Labs Reviewed  I-STAT CHEM 8, ED - Abnormal; Notable for the following components:      Result Value   Calcium, Ion 1.10 (*)    All other components within normal limits  I-STAT BETA HCG BLOOD, ED ([facility], [facility], [facility] ONLY)    EKG None  Radiology Ct Head Wo Contrast  Result Date: 10/18/2018 CLINICAL DATA:  Fall yesterday.  Left-sided head and neck pain. EXAM: CT HEAD WITHOUT CONTRAST CT CERVICAL SPINE WITHOUT CONTRAST TECHNIQUE: Multidetector CT imaging of the head and cervical spine was performed following the standard protocol without intravenous contrast. Multiplanar CT image reconstructions of the cervical spine were also generated. COMPARISON:  Cervical spine radiographs 08/26/2018. MRI of the cervical spine 06/28/2018 FINDINGS: CT HEAD FINDINGS Brain: No acute infarct, hemorrhage, or mass lesion is present. No significant white matter lesions are present. The ventricles are of normal size. No significant extraaxial fluid collection is present. The brainstem and cerebellum are within normal limits. Vascular: No hyperdense vessel or unexpected calcification. Skull: Calvarium is intact. No focal lytic or blastic lesions are present. No significant extracranial soft tissue injury is present. Sinuses/Orbits:  The paranasal sinuses and mastoid air cells are clear. The globes and orbits are within normal limits. CT CERVICAL SPINE FINDINGS Alignment: AP alignment is anatomic. There straightening of the normal cervical lordosis. Skull base and vertebrae: Craniocervical junction is normal. Vertebral body heights are maintained. No acute or healing fractures are present. Soft tissues and spinal canal: No prevertebral fluid or swelling. No visible canal hematoma. Disc levels: Disc replacement is noted at C5-6. Disc spaces are otherwise maintained. Broad-based disc osteophyte complex is again noted at C4-5. No new disc disease or stenosis is evident. Upper chest: Lung apices are clear. Thoracic inlet is within normal limits. IMPRESSION: 1. No acute trauma to the head or cervical spine. 2. Normal CT of the head. 3. Cervical disc replacement at C5-6. 4. Stable chronic disc osteophyte complex at C4-5. 5. No acute abnormality of the cervical spine. Electronically Signed   By: Marin Robertshristopher  Mattern M.D.   On: 10/18/2018 05:25   Ct Cervical Spine Wo Contrast  Result Date: 10/18/2018 CLINICAL DATA:  Fall yesterday.  Left-sided head and neck pain. EXAM: CT HEAD WITHOUT CONTRAST CT CERVICAL SPINE WITHOUT CONTRAST TECHNIQUE: Multidetector CT imaging of the head and cervical spine was performed following the standard protocol without intravenous contrast. Multiplanar CT image reconstructions of the cervical spine were also generated. COMPARISON:  Cervical spine radiographs 08/26/2018. MRI of the cervical spine 06/28/2018 FINDINGS: CT HEAD FINDINGS Brain: No acute infarct, hemorrhage, or mass lesion is present. No significant white matter lesions are present. The ventricles are of normal size. No significant extraaxial fluid collection is present. The brainstem and cerebellum are within normal limits. Vascular: No hyperdense vessel or unexpected calcification. Skull: Calvarium is intact. No focal lytic or blastic lesions are present. No  significant extracranial soft tissue injury is present. Sinuses/Orbits: The paranasal sinuses and mastoid air cells are clear. The globes and orbits are within normal limits. CT CERVICAL SPINE FINDINGS Alignment: AP alignment is anatomic. There straightening of the normal cervical lordosis. Skull base and vertebrae: Craniocervical junction is normal. Vertebral body heights are maintained. No acute or healing fractures are present. Soft tissues and spinal canal: No prevertebral fluid or swelling. No visible  canal hematoma. Disc levels: Disc replacement is noted at C5-6. Disc spaces are otherwise maintained. Broad-based disc osteophyte complex is again noted at C4-5. No new disc disease or stenosis is evident. Upper chest: Lung apices are clear. Thoracic inlet is within normal limits. IMPRESSION: 1. No acute trauma to the head or cervical spine. 2. Normal CT of the head. 3. Cervical disc replacement at C5-6. 4. Stable chronic disc osteophyte complex at C4-5. 5. No acute abnormality of the cervical spine. Electronically Signed   By: San Morelle M.D.   On: 10/18/2018 05:25    Procedures Procedures (including critical care time)  Medications Ordered in ED Medications  ondansetron (ZOFRAN-ODT) disintegrating tablet 4 mg (has no administration in time range)  metoCLOPramide (REGLAN) tablet 10 mg (has no administration in time range)  diphenhydrAMINE (BENADRYL) capsule 25 mg (has no administration in time range)     Initial Impression / Assessment and Plan / ED Course  I have reviewed the triage vital signs and the nursing notes.  Pertinent labs & imaging results that were available during my care of the patient were reviewed by me and considered in my medical decision making (see chart for details).         Patient with left-sided headache and neck pain after fall yesterday.  Uncertain whether she hit her head but did not lose consciousness.  No vomiting.  This headache is different than her  usual chronic headache.  She is concerned because of her recent C-spine surgery. No new focal deficits, no weakness, numbness or tingling.  Denies headache preceding fall.  Denies thunderclap onset.  CT head and C spine negative. No new neuro deficits.  Headache still present after toradol, reglan, benadryl. No visual changes.  Denies headache preceding fall, denies thunderclap onset.  Suspect likely post traumatic/concussive, however patient not convinced that she did hit her head. Doubt SAH, meningitis, temporal arteritis.  With ongoing atypical headache with questionable trauma, patient agreeable to proceed with CTA to rule out vascular abnormality. She adamantly states she does not want lumbar puncture. Description of headache not consistent with SAH.  Dr. Thurnell Garbe to assume care at shift change with CTA pending. Anticipate discharge home if normal with head injury precautions.   Final Clinical Impressions(s) / ED Diagnoses   Final diagnoses:  Injury of head, initial encounter    ED Discharge Orders    None       Amaiah Cristiano, Annie Main, MD 10/18/18 [phone]

## 2018-10-18 NOTE — ED Provider Notes (Signed)
Pt received at sign out with CTA pending. Please see previous EDP note for full HPI/H&P/MDM. 38yo F, c/o continued headache after fall and hitting head 2 days ago. A&O, resps easy, Major CN grossly intact. Speech clear.  No facial droop.  +horizontal nystagmus. Grips equal. Strength 5/5 equal bilat UE's and LE's.  DTR 2/4 equal bilat UE's and LE's.  No gross sensory deficits.  Normal cerebellar testing bilat UE's (finger-nose) and LE's (heel-shin). CT-H and CS reassuring. CTA as below. 1025:  T/C returned from Vascular Dr. Carlis Abbott (currently in [place]), case discussed, including:  HPI, pertinent PM/SHx, VS/PE, dx testing, ED course and treatment:  Agreeable to view CTA images and call me back. 1140:  T/C from [facility] Dr. Carlis Abbott: he has viewed the images, as well as consulted Neurosurgeon MD who has viewed the images, states no clear dissection present, no acute surgical issue, there is also no indication for admission at this time, have pt start ASA 81mg  PO daily, rx pain control, instruct pt to have someone stay with her for the next 24 hours w/strict return precautions (if pt is alone, then pt can be observation admission for 24 hrs at [facility]), pt can f/u office.  Pt given pain meds while in the ED. Pt has tol PO well while in the ED without N/V. No change in neuro assessment (neuro assessment also reviewed with Vascular MD).  Dx and testing, as well as d/w Vasc MD, d/w pt and family.  Questions answered.  Verb understanding, agreeable to d/c home with outpt f/u. Strict return precautions given.    BP 120/80   Pulse 83   Temp 98.9 F (37.2 C) (Oral)   Resp 18   Ht 5\' 8"  (1.727 m)   Wt 95.3 kg   SpO2 97%   BMI 31.93 kg/m    MDM Reviewed: previous chart, nursing note and vitals Reviewed previous: labs Interpretation: labs and CT scan Consults: Vascular Surgery.   CRITICAL CARE Performed by: Francine Graven Total critical care time: 35 minutes Critical care time was exclusive of separately  billable procedures and treating other patients. Critical care was necessary to treat or prevent imminent or life-threatening deterioration. Critical care was time spent personally by me on the following activities: development of treatment plan with patient and/or surrogate as well as nursing, discussions with consultants, evaluation of patient's response to treatment, examination of patient, obtaining history from patient or surrogate, ordering and performing treatments and interventions, ordering and review of laboratory studies, ordering and review of radiographic studies, pulse oximetry and re-evaluation of patient's condition.    Results for orders placed or performed during the hospital encounter of 10/18/18  I-stat chem 8, ED (not at [facility] or [facility])  Result Value Ref Range   Sodium 141 135 - 145 mmol/L   Potassium 3.8 3.5 - 5.1 mmol/L   Chloride 106 98 - 111 mmol/L   BUN 10 6 - 20 mg/dL   Creatinine, Ser 1.00 0.44 - 1.00 mg/dL   Glucose, Bld 84 70 - 99 mg/dL   Calcium, Ion 1.10 (L) 1.15 - 1.40 mmol/L   TCO2 23 22 - 32 mmol/L   Hemoglobin 13.3 12.0 - 15.0 g/dL   HCT 39.0 36.0 - 46.0 %     Ct Cervical Spine Wo Contrast Result Date: 10/18/2018 CLINICAL DATA:  Fall yesterday.  Left-sided head and neck pain. EXAM: CT HEAD WITHOUT CONTRAST CT CERVICAL SPINE WITHOUT CONTRAST TECHNIQUE: Multidetector CT imaging of the head and cervical spine was performed  following the standard protocol without intravenous contrast. Multiplanar CT image reconstructions of the cervical spine were also generated. COMPARISON:  Cervical spine radiographs 08/26/2018. MRI of the cervical spine 06/28/2018 FINDINGS: CT HEAD FINDINGS Brain: No acute infarct, hemorrhage, or mass lesion is present. No significant white matter lesions are present. The ventricles are of normal size. No significant extraaxial fluid collection is present. The brainstem and cerebellum are within normal limits. Vascular: No hyperdense vessel or  unexpected calcification. Skull: Calvarium is intact. No focal lytic or blastic lesions are present. No significant extracranial soft tissue injury is present. Sinuses/Orbits: The paranasal sinuses and mastoid air cells are clear. The globes and orbits are within normal limits. CT CERVICAL SPINE FINDINGS Alignment: AP alignment is anatomic. There straightening of the normal cervical lordosis. Skull base and vertebrae: Craniocervical junction is normal. Vertebral body heights are maintained. No acute or healing fractures are present. Soft tissues and spinal canal: No prevertebral fluid or swelling. No visible canal hematoma. Disc levels: Disc replacement is noted at C5-6. Disc spaces are otherwise maintained. Broad-based disc osteophyte complex is again noted at C4-5. No new disc disease or stenosis is evident. Upper chest: Lung apices are clear. Thoracic inlet is within normal limits. IMPRESSION: 1. No acute trauma to the head or cervical spine. 2. Normal CT of the head. 3. Cervical disc replacement at C5-6. 4. Stable chronic disc osteophyte complex at C4-5. 5. No acute abnormality of the cervical spine. Electronically Signed   By: Marin Robertshristopher  Mattern M.D.   On: 10/18/2018 05:25    Ct Head Wo Contrast Result Date: 10/18/2018 CLINICAL DATA:  Fall yesterday.  Left-sided head and neck pain. EXAM: CT HEAD WITHOUT CONTRAST CT CERVICAL SPINE WITHOUT CONTRAST TECHNIQUE: Multidetector CT imaging of the head and cervical spine was performed following the standard protocol without intravenous contrast. Multiplanar CT image reconstructions of the cervical spine were also generated. COMPARISON:  Cervical spine radiographs 08/26/2018. MRI of the cervical spine 06/28/2018 FINDINGS: CT HEAD FINDINGS Brain: No acute infarct, hemorrhage, or mass lesion is present. No significant white matter lesions are present. The ventricles are of normal size. No significant extraaxial fluid collection is present. The brainstem and cerebellum  are within normal limits. Vascular: No hyperdense vessel or unexpected calcification. Skull: Calvarium is intact. No focal lytic or blastic lesions are present. No significant extracranial soft tissue injury is present. Sinuses/Orbits: The paranasal sinuses and mastoid air cells are clear. The globes and orbits are within normal limits. CT CERVICAL SPINE FINDINGS Alignment: AP alignment is anatomic. There straightening of the normal cervical lordosis. Skull base and vertebrae: Craniocervical junction is normal. Vertebral body heights are maintained. No acute or healing fractures are present. Soft tissues and spinal canal: No prevertebral fluid or swelling. No visible canal hematoma. Disc levels: Disc replacement is noted at C5-6. Disc spaces are otherwise maintained. Broad-based disc osteophyte complex is again noted at C4-5. No new disc disease or stenosis is evident. Upper chest: Lung apices are clear. Thoracic inlet is within normal limits. IMPRESSION: 1. No acute trauma to the head or cervical spine. 2. Normal CT of the head. 3. Cervical disc replacement at C5-6. 4. Stable chronic disc osteophyte complex at C4-5. 5. No acute abnormality of the cervical spine. Electronically Signed   By: Marin Robertshristopher  Mattern M.D.   On: 10/18/2018 05:25   Ct Angio Neck W And/or Wo Contrast Result Date: 10/18/2018 CLINICAL DATA:  Worsening neck pain and left-sided headaches since a fall yesterday. EXAM: CT ANGIOGRAPHY HEAD AND NECK  TECHNIQUE: Multidetector CT imaging of the head and neck was performed using the standard protocol during bolus administration of intravenous contrast. Multiplanar CT image reconstructions and MIPs were obtained to evaluate the vascular anatomy. Carotid stenosis measurements (when applicable) are obtained utilizing NASCET criteria, using the distal internal carotid diameter as the denominator. CONTRAST:  75mL OMNIPAQUE IOHEXOL 350 MG/ML SOLN COMPARISON:  Head and cervical spine CT 10/18/2018. No prior  angiographic imaging. FINDINGS: CTA NECK FINDINGS Aortic arch: Standard 3 vessel aortic arch with widely patent arch vessel origins. Right carotid system: Patent and smooth without evidence of stenosis or dissection. Left carotid system: Patent without evidence of stenosis or dissection. Suggestion of a small web at the ICA origin. Vertebral arteries: The right vertebral artery is widely patent without evidence of stenosis or dissection. The left vertebral artery is patent with severe segmental narrowing at the C1-2 level with normal caliber more proximally and distally. Skeleton: Detail cervical spine assessment deferred to today's earlier dedicated spine CT. Other neck: No evidence of cervical lymphadenopathy or mass. Upper chest: Clear lung apices. Review of the MIP images confirms the above findings CTA HEAD FINDINGS Anterior circulation: The internal carotid arteries are widely patent from skull base to carotid termini. ACAs and MCAs are patent without evidence of proximal branch occlusion or significant stenosis. No aneurysm is identified. Posterior circulation: The intracranial vertebral arteries are widely patent to the basilar with the right being dominant. Patent bilateral PICAs, a left AICA, and bilateral SCAs are identified. The basilar artery is widely patent. There are right larger than left posterior communicating arteries with a severely hypoplastic right P1 segment. Both PCAs are patent without evidence of significant stenosis. No aneurysm is identified. Venous sinuses: Patent. Anatomic variants: Predominantly fetal type origin of the right PCA. Review of the MIP images confirms the above findings IMPRESSION: 1. 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 2. Widely patent right vertebral artery and cervical carotid arteries. 3. Widely patency of the major intracranial arteries. Electronically Signed    By: Sebastian AcheAllen  Grady M.D.   On: 10/18/2018 09:34    Ct Angio Head W Or Wo Contrast Result Date: 10/18/2018 CLINICAL DATA:  Worsening neck pain and left-sided headaches since a fall yesterday. EXAM: CT ANGIOGRAPHY HEAD AND NECK TECHNIQUE: Multidetector CT imaging of the head and neck was performed using the standard protocol during bolus administration of intravenous contrast. Multiplanar CT image reconstructions and MIPs were obtained to evaluate the vascular anatomy. Carotid stenosis measurements (when applicable) are obtained utilizing NASCET criteria, using the distal internal carotid diameter as the denominator. CONTRAST:  75mL OMNIPAQUE IOHEXOL 350 MG/ML SOLN COMPARISON:  Head and cervical spine CT 10/18/2018. No prior angiographic imaging. FINDINGS: CTA NECK FINDINGS Aortic arch: Standard 3 vessel aortic arch with widely patent arch vessel origins. Right carotid system: Patent and smooth without evidence of stenosis or dissection. Left carotid system: Patent without evidence of stenosis or dissection. Suggestion of a small web at the ICA origin. Vertebral arteries: The right vertebral artery is widely patent without evidence of stenosis or dissection. The left vertebral artery is patent with severe segmental narrowing at the C1-2 level with normal caliber more proximally and distally. Skeleton: Detail cervical spine assessment deferred to today's earlier dedicated spine CT. Other neck: No evidence of cervical lymphadenopathy or mass. Upper chest: Clear lung apices. Review of the MIP images confirms the above findings CTA HEAD FINDINGS Anterior circulation: The  internal carotid arteries are widely patent from skull base to carotid termini. ACAs and MCAs are patent without evidence of proximal branch occlusion or significant stenosis. No aneurysm is identified. Posterior circulation: The intracranial vertebral arteries are widely patent to the basilar with the right being dominant. Patent bilateral PICAs, a left  AICA, and bilateral SCAs are identified. The basilar artery is widely patent. There are right larger than left posterior communicating arteries with a severely hypoplastic right P1 segment. Both PCAs are patent without evidence of significant stenosis. No aneurysm is identified. Venous sinuses: Patent. Anatomic variants: Predominantly fetal type origin of the right PCA. Review of the MIP images confirms the above findings IMPRESSION: 1. 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 2. Widely patent right vertebral artery and cervical carotid arteries. 3. Widely patency of the major intracranial arteries. Electronically Signed   By: Sebastian AcheAllen  Grady M.D.   On: 10/18/2018 09:34     Samuel JesterMcManus, Mollyann Halbert, DO 10/18/18 1242

## 2018-10-25 ENCOUNTER — Other Ambulatory Visit: Payer: Self-pay

## 2018-10-25 ENCOUNTER — Encounter: Payer: Self-pay | Admitting: Surgery

## 2018-10-25 ENCOUNTER — Ambulatory Visit (INDEPENDENT_AMBULATORY_CARE_PROVIDER_SITE_OTHER): Payer: No Typology Code available for payment source | Admitting: Surgery

## 2018-10-25 DIAGNOSIS — I7774 Dissection of vertebral artery: Secondary | ICD-10-CM | POA: Diagnosis not present

## 2018-10-25 NOTE — Progress Notes (Signed)
[facility]  Patient name: Elizabeth GoldmannCeleste A Nance MRN: [id number] DOB: 1980-08-13 Sex: female   REQUESTING PROVIDER:    Dr. Richrd PrimeMcMannus   REASON FOR CONSULT:    Possible vertebral dissection  HISTORY OF PRESENT ILLNESS:   Brandan A Memory Franklin is a 38 y.o. female, who presented tot the [facility] ER on 10-18-2018 following a fall where she tensed up her neck.  Since her fall she began having worsening of her chronic left-sided headache as well as a pulsing feeling.  This is what prompted her to go to the emergency department.  She stated that her headache was different from her chronic headache.  She did not lose consciousness with the fall.  She did not have any nausea or vomiting or neurovascular compromise.  She underwent CT scan imaging which was concerning for possible dissection of the vertebral artery.  Dr. Chestine Sporelark with vascular surgery was consulted as well as neurosurgery.  It was recommended that the patient start taking an aspirin.  She was discharged in the hospital.  The patient has a history of chronic headaches as well as recent cervical spine surgery in March 2020.  She has been in contact with Dr. Dutch QuintPoole, her neurosurgeon.  She is a former smoker.  PAST MEDICAL HISTORY    Past Medical History:  Diagnosis Date  . Anxiety   . Chronic headaches      FAMILY HISTORY   Family History  Problem Relation Age of Onset  . Hypertension Mother   . Mental illness Mother   . Heart failure Father   . Heart disease Father   . Hypertension Father   . Diabetes Brother   . Hyperlipidemia Brother   . Cancer Paternal Grandmother   . Diabetes Paternal Grandmother   . Heart disease Paternal Grandmother     SOCIAL HISTORY:   Social History   Socioeconomic History  . Marital status: Single    Spouse name: Not on file  . Number of children: Not on file  . Years of education: Not on file  . Highest education level: Not on file   Occupational History  . Not on file  Social Needs  . Financial resource strain: Not on file  . Food insecurity    Worry: Not on file    Inability: Not on file  . Transportation needs    Medical: Not on file    Non-medical: Not on file  Tobacco Use  . Smoking status: Former Smoker    Packs/day: 0.25    Years: 1.00    Pack years: 0.25    Types: Cigarettes    Quit date: 01/26/1999    Years since quitting: 19.7  . Smokeless tobacco: Never Used  Substance and Sexual Activity  . Alcohol use: Yes    Comment: occassional  . Drug use: No  . Sexual activity: Yes    Birth control/protection: None  Lifestyle  . Physical activity    Days per week: Not on file    Minutes per session: Not on file  . Stress: Not on file  Relationships  . Social Musicianconnections    Talks on phone: Not on file    Gets together: Not on file    Attends religious service: Not on file    Active member of club or organization: Not on file    Attends meetings of clubs or organizations: Not on file    Relationship status: Not on file  . Intimate partner violence  Fear of current or ex partner: Not on file    Emotionally abused: Not on file    Physically abused: Not on file    Forced sexual activity: Not on file  Other Topics Concern  . Not on file  Social History Narrative  . Not on file    ALLERGIES:    Allergies  Allergen Reactions  . Codeine Nausea And Vomiting    CURRENT MEDICATIONS:    Current Outpatient Medications  Medication Sig Dispense Refill  . aspirin-acetaminophen-caffeine (EXCEDRIN MIGRAINE) 250-250-65 MG per tablet Take 1 tablet by mouth every 6 (six) hours as needed. Headache    . ibuprofen (ADVIL,MOTRIN) 200 MG tablet Take 800 mg by mouth every 6 (six) hours as needed for moderate pain.    . Multiple Vitamin (MULTIVITAMIN WITH MINERALS) TABS Take 1 tablet by mouth daily.    Marland Kitchen. ofloxacin (OCUFLOX) 0.3 % ophthalmic solution Use 1 or 2 drops in each eye every 2-3 hours today, then  decrease to 4 times daily 5 mL 0  . ondansetron (ZOFRAN ODT) 4 MG disintegrating tablet Take 1 tablet (4 mg total) by mouth every 8 (eight) hours as needed for nausea or vomiting. 6 tablet 0  . oxyCODONE-acetaminophen (PERCOCET/ROXICET) 5-325 MG tablet 1 or 2 tabs PO q8h prn pain 15 tablet 0   No current facility-administered medications for this visit.     REVIEW OF SYSTEMS:   [X]  denotes positive finding, [ ]  denotes negative finding Cardiac  Comments:  Chest pain or chest pressure: x   Shortness of breath upon exertion: x   Short of breath when lying flat:    Irregular heart rhythm:        Vascular    Pain in calf, thigh, or hip brought on by ambulation:    Pain in feet at night that wakes you up from your sleep:     Blood clot in your veins:    Leg swelling:         Pulmonary    Oxygen at home:    Productive cough:     Wheezing:         Neurologic    Sudden weakness in arms or legs:  x   Sudden numbness in arms or legs:     Sudden onset of difficulty speaking or slurred speech: x   Temporary loss of vision in one eye:     Problems with dizziness:  x       Gastrointestinal    Blood in stool:      Vomited blood:         Genitourinary    Burning when urinating:     Blood in urine:        Psychiatric    Major depression:         Hematologic    Bleeding problems:    Problems with blood clotting too easily:        Skin    Rashes or ulcers:        Constitutional    Fever or chills:     PHYSICAL EXAM:   There were no vitals filed for this visit.  GENERAL: The patient is a well-nourished female, in no acute distress. The vital signs are documented above. CARDIAC: There is a regular rate and rhythm.  PULMONARY: Nonlabored respirations MUSCULOSKELETAL: There are no major deformities or cyanosis. NEUROLOGIC: No focal weakness or paresthesias are detected. SKIN: There are no ulcers or rashes noted. PSYCHIATRIC: The patient has a normal  affect.  STUDIES:    I have reviewed her CT scan with the following findings:  IMPRESSION: 1. 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 2. Widely patent right vertebral artery and cervical carotid arteries. 3. Widely patency of the major intracranial arteries. 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  Medical management this would consist of antiplatelet therapy, which she is taking (81 mg aspirin) as well as blood pressure control.  I would like to repeat a CT scan of the head and neck to evaluate for aneurysmal change and to see if we get a better evaluation of her vessels.  I believe that all of the patient's symptoms are more related to her recent spine surgery.  I do not think that she has any signs consistent with a dissection.   Leia Alf, MD, FACS [facility] [phone] Pager [phone]

## 2018-11-25 ENCOUNTER — Other Ambulatory Visit: Payer: Self-pay

## 2018-11-25 DIAGNOSIS — I7774 Dissection of vertebral artery: Secondary | ICD-10-CM

## 2018-12-06 ENCOUNTER — Other Ambulatory Visit: Payer: No Typology Code available for payment source

## 2018-12-09 ENCOUNTER — Ambulatory Visit (HOSPITAL_COMMUNITY): Payer: No Typology Code available for payment source

## 2018-12-13 ENCOUNTER — Ambulatory Visit: Payer: No Typology Code available for payment source | Admitting: Surgery

## 2019-03-11 ENCOUNTER — Other Ambulatory Visit (HOSPITAL_BASED_OUTPATIENT_CLINIC_OR_DEPARTMENT_OTHER): Payer: Self-pay | Admitting: Neurosurgery

## 2019-03-11 DIAGNOSIS — I7774 Dissection of vertebral artery: Secondary | ICD-10-CM

## 2019-05-08 ENCOUNTER — Encounter: Payer: Self-pay | Admitting: Emergency Medicine

## 2019-05-08 ENCOUNTER — Other Ambulatory Visit: Payer: Self-pay

## 2019-05-08 ENCOUNTER — Emergency Department
Admission: EM | Admit: 2019-05-08 | Discharge: 2019-05-08 | Disposition: A | Discharge status: Home / Self Care | Payer: No Typology Code available for payment source | Source: Home / Self Care | Attending: Emergency Medicine | Admitting: Emergency Medicine

## 2019-05-08 DIAGNOSIS — H209 Unspecified iridocyclitis: Secondary | ICD-10-CM

## 2019-05-08 DIAGNOSIS — H579 Unspecified disorder of eye and adnexa: Secondary | ICD-10-CM

## 2019-05-08 MED ORDER — OFLOXACIN 0.3 % OP SOLN: 1.0000 [drp] | Freq: Four times a day (QID) | OPHTHALMIC | 0 refills | Status: DC

## 2019-05-08 NOTE — ED Triage Notes (Signed)
Awoke this am with reddness in left eye, some lid edema, and discharge; there is both pain and itching; she did not put contact in that eye so no eye chart eval is possible.  She has had influenza vacc this season.  No known covid exposure.

## 2019-05-08 NOTE — ED Provider Notes (Signed)
[facility]    CSN: [id number] Arrival date & time: 05/08/19  1137      History   Chief Complaint Chief Complaint  Patient presents with  . Eye Problem    HPI Elizabeth Franklin is a 38 y.o. female.  Patient awoke this morning with severe pain and discomfort in the left thigh.  She noticed swelling around the left upper lid.  He also had a purulent looking drainage involving the left eye.  She wears contacts and did take her contacts out last night.  She does not see well without a contact them.  She has significant photophobia with this. HPI  Past Medical History:  Diagnosis Date  . Anxiety   . Chronic headaches     Patient Active Problem List   Diagnosis Date Noted  . 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 Excessive or frequent menstruation 11/21/2013  . Dysmenorrhea 11/21/2013    Past Surgical History:  Procedure Laterality Date  . APPENDECTOMY    . CERVICAL DISC SURGERY    . DILITATION & CURRETTAGE/HYSTROSCOPY WITH THERMACHOICE ABLATION N/A 01/30/2014   Procedure: DILATATION & CURETTAGE/HYSTEROSCOPY WITH THERMACHOICE ABLATION;  Surgeon: Lazaro Arms, MD;  Location: [facility];  Service: Gynecology;  Laterality: N/A;  Total Ablation Therapy Time 9 minutes 5 seconds; 86-88 deg C  . LAPAROSCOPIC BILATERAL SALPINGECTOMY Bilateral 01/30/2014   Procedure: LAPAROSCOPIC BILATERAL SALPINGECTOMY;  Surgeon: Lazaro Arms, MD;  Location: [facility];  Service: Gynecology;  Laterality: Bilateral;    OB History   No obstetric history on file.      Home Medications    Prior to Admission medications   Medication Sig Start Date End Date Taking? Authorizing Provider  aspirin EC 81 MG tablet Take 81 mg by mouth daily.    [provider]  ibuprofen (ADVIL,MOTRIN) 200 MG tablet Take 800 mg by mouth every 6 (six) hours as needed for moderate pain.    [provider]  Multiple Vitamin (MULTIVITAMIN WITH MINERALS) TABS Take 1 tablet by mouth daily.     [provider]  ofloxacin (OCUFLOX) 0.3 % ophthalmic solution Place 1 drop into the left eye 4 (four) times daily. 05/08/19   [name], MD  ondansetron (ZOFRAN ODT) 4 MG disintegrating tablet Take 1 tablet (4 mg total) by mouth every 8 (eight) hours as needed for nausea or vomiting. 10/18/18   [name], DO  sertraline (ZOLOFT) 50 MG tablet Take 50 mg by mouth daily.    [provider]    Family History Family History  Problem Relation Age of Onset  . Hypertension Mother   . Mental illness Mother   . Heart failure Father   . Heart disease Father   . Hypertension Father   . Diabetes Brother   . Hyperlipidemia Brother   . Cancer Paternal Grandmother   . Diabetes Paternal Grandmother   . Heart disease Paternal Grandmother     Social History Social History   Tobacco Use  . Smoking status: Former Smoker    Packs/day: 0.25    Years: 1.00    Pack years: 0.25    Types: Cigarettes    Quit date: 01/26/1999    Years since quitting: 20.2  . Smokeless tobacco: Never Used  Substance Use Topics  . Alcohol use: Yes    Comment: occassional  . Drug use: No     Allergies   Codeine   Review of Systems Review of Systems  Constitutional: Negative.  HENT: Negative.        Patient has severe discomfort in the left eye.  Of note she did undergo evaluation of floaters in her left eye.  She has a history of previous vertebral artery dissection.  Respiratory: Negative.   Cardiovascular: Negative.      Physical Exam Triage Vital Signs ED Triage Vitals  Enc Vitals Group     BP 05/08/19 1200 (!) 144/85     Pulse Rate 05/08/19 1200 68     Resp 05/08/19 1200 16     Temp 05/08/19 1200 98.3 F (36.8 C)     Temp Source 05/08/19 1200 Oral     SpO2 05/08/19 1200 99 %     Weight 05/08/19 1201 195 lb (88.5 kg)     Height 05/08/19 1201 5\' 8"  (1.727 m)     Head Circumference --      Peak Flow --      Pain Score 05/08/19 1201 1     Pain Loc --      Pain Edu?  --      Excl. in [place]? --    No data found.  Updated Vital Signs BP (!) 144/85 (BP Location: Right Arm)   Pulse 68   Temp 98.3 F (36.8 C) (Oral)   Resp 16   Ht 5\' 8"  (1.727 m)   Wt 88.5 kg   SpO2 99%   BMI 29.65 kg/m   Visual Acuity Right Eye Distance:   Left Eye Distance:   Bilateral Distance:    Right Eye Near:   Left Eye Near:    Bilateral Near:     Physical Exam Constitutional:      Comments: Patient sitting in the room with sunglasses on due to photophobia.  HENT:     Head: Normocephalic.     Nose: Nose normal.  Eyes:     Comments: Pupils are equal and reactive to light.  There is photophobia on the left.  There is mild lid edema of the left upper lid.  There is marked injection at the corneal scleral junction.  No purulence is seen.  The anterior chamber does not have a hyphema.   Procedure note 3 drops of tetracaine instilled.  Fluorescein staining of pain and there was minimal uptake around the periphery of the cornea but no lesions were seen on the central cornea.  Vision was attempted but she could not see the E on the Snellen chart UC Treatments / Results  Labs (all labs ordered are listed, but only abnormal results are displayed) Labs Reviewed  NOVEL CORONAVIRUS, NAA    EKG   Radiology No results found.  Procedures Procedures (including critical care time)  Medications Ordered in UC Medications - No data to display  Initial Impression / Assessment and Plan / UC Course  I have reviewed the triage vital signs and the nursing notes. Patient appears to have an iritis involving the left eye.  I am not sure if this was related to her contacts or due to another medical issue.  She is advised to follow-up with her eye doctor tomorrow.  She has an optometrist and a ophthalmologist.  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  She did not want her eye patch.  Covid testing was done because of her work  exposures. Pertinent labs & imaging results that were available during my care of the patient were reviewed by me and considered in  my medical decision making (see chart for details).      Final Clinical Impressions(s) / UC Diagnoses   Final diagnoses:  Eye problem  Iritis     Discharge Instructions     Use drops as instructed. Stay in a dark room. Follow-up with your eye physician tomorrow.    ED Prescriptions    Medication Sig Dispense Auth. Provider   ofloxacin (OCUFLOX) 0.3 % ophthalmic solution Place 1 drop into the left eye 4 (four) times daily. 5 mL Collene Gobbleaub, Camden Mazzaferro A, MD     PDMP not reviewed this encounter.   Collene Gobbleaub, Shelise Maron A, MD 05/08/19 1236

## 2019-05-08 NOTE — Discharge Instructions (Signed)
Use drops as instructed. Stay in a dark room. Follow-up with your eye physician tomorrow.

## 2019-05-11 LAB — NOVEL CORONAVIRUS, NAA: SARS-CoV-2, NAA: NOT DETECTED

## 2019-10-26 ENCOUNTER — Other Ambulatory Visit: Payer: Self-pay

## 2019-10-26 ENCOUNTER — Ambulatory Visit
Admission: EM | Admit: 2019-10-26 | Discharge: 2019-10-26 | Disposition: A | Discharge status: Home / Self Care | Payer: No Typology Code available for payment source | Attending: Physician Assistant | Admitting: Physician Assistant

## 2019-10-26 DIAGNOSIS — J039 Acute tonsillitis, unspecified: Secondary | ICD-10-CM | POA: Insufficient documentation

## 2019-10-26 LAB — POCT RAPID STREP A (OFFICE): Rapid Strep A Screen: NEGATIVE

## 2019-10-26 MED ORDER — FLUCONAZOLE 150 MG PO TABS
150.0000 mg | ORAL_TABLET | Freq: Every day | ORAL | 0 refills | Status: DC
Start: 2019-10-26 — End: 2020-01-10

## 2019-10-26 MED ORDER — AMOXICILLIN 500 MG PO CAPS
500.0000 mg | ORAL_CAPSULE | Freq: Two times a day (BID) | ORAL | 0 refills | Status: DC
Start: 2019-10-26 — End: 2020-01-10

## 2019-10-26 MED ORDER — LIDOCAINE VISCOUS HCL 2 % MT SOLN
15.0000 mL | OROMUCOSAL | 0 refills | Status: DC | PRN
Start: 2019-10-26 — End: 2020-01-10

## 2019-10-26 NOTE — ED Provider Notes (Signed)
[facility]    CSN: [id number] Arrival date & time: 10/26/19  0844      History   Chief Complaint Chief Complaint  Patient presents with  . Sore Throat    HPI Elizabeth Franklin is a 39 y.o. female.   39 year old female comes in for 2 day of sore throat, body aches, swollen lymph nodes. Denies rhinorrhea, nasal congestion, cough.Denies fever, chills, body aches. Denies abdominal pain, nausea, vomiting, diarrhea. Denies shortness of breath, loss of taste/smell.      Past Medical History:  Diagnosis Date  . Anxiety   . Chronic headaches     Patient Active Problem List   Diagnosis Date Noted  . Dissection of vertebral artery ([place]) 10/25/2018  . Excessive or frequent menstruation 11/21/2013  . Dysmenorrhea 11/21/2013    Past Surgical History:  Procedure Laterality Date  . APPENDECTOMY    . CERVICAL DISC SURGERY    . DILITATION & CURRETTAGE/HYSTROSCOPY WITH THERMACHOICE ABLATION N/A 01/30/2014   Procedure: DILATATION & CURETTAGE/HYSTEROSCOPY WITH THERMACHOICE ABLATION;  Surgeon: Florian Buff, MD;  Location: [facility];  Service: Gynecology;  Laterality: N/A;  Total Ablation Therapy Time 9 minutes 5 seconds; 86-88 deg C  . LAPAROSCOPIC BILATERAL SALPINGECTOMY Bilateral 01/30/2014   Procedure: LAPAROSCOPIC BILATERAL SALPINGECTOMY;  Surgeon: Florian Buff, MD;  Location: [facility];  Service: Gynecology;  Laterality: Bilateral;    OB History   No obstetric history on file.      Home Medications    Prior to Admission medications   Medication Sig Start Date End Date Taking? Authorizing Provider  amoxicillin (AMOXIL) 500 MG capsule Take 1 capsule (500 mg total) by mouth 2 (two) times daily. 10/26/19   Tasia Catchings, Eddis Pingleton V, PA-C  fluconazole (DIFLUCAN) 150 MG tablet Take 1 tablet (150 mg total) by mouth daily. Take second dose 72 hours later if symptoms still persists. 10/26/19   Tasia Catchings, Zollie Ellery V, PA-C  ibuprofen (ADVIL,MOTRIN) 200 MG tablet Take 800 mg by mouth every 6 (six) hours as  needed for moderate pain.    [provider]  lidocaine (XYLOCAINE) 2 % solution Use as directed 15 mLs in the mouth or throat as needed for mouth pain. 10/26/19   Ok Edwards, PA-C    Family History Family History  Problem Relation Age of Onset  . Hypertension Mother   . Mental illness Mother   . Heart failure Father   . Heart disease Father   . Hypertension Father   . Diabetes Brother   . Hyperlipidemia Brother   . Cancer Paternal Grandmother   . Diabetes Paternal Grandmother   . Heart disease Paternal Grandmother     Social History Social History   Tobacco Use  . Smoking status: Former Smoker    Packs/day: 0.25    Years: 1.00    Pack years: 0.25    Types: Cigarettes    Quit date: 01/26/1999    Years since quitting: 20.7  . Smokeless tobacco: Never Used  Substance Use Topics  . Alcohol use: Yes    Comment: occassional  . Drug use: No     Allergies   Codeine   Review of Systems Review of Systems  Reason unable to perform ROS: See HPI as above.     Physical Exam Triage Vital Signs ED Triage Vitals [10/26/19 0858]  Enc Vitals Group     BP 133/88     Pulse Rate (!) 101     Resp 18  Temp 99.9 F (37.7 C)     Temp Source Oral     SpO2 96 %     Weight      Height      Head Circumference      Peak Flow      Pain Score 10     Pain Loc      Pain Edu?      Excl. in [place]?    No data found.  Updated Vital Signs BP 133/88 (BP Location: Left Arm)   Pulse (!) 101   Temp 99.9 F (37.7 C) (Oral)   Resp 18   LMP 10/25/2019   SpO2 96%   Physical Exam Constitutional:      General: She is not in acute distress.    Appearance: Normal appearance. She is not ill-appearing, toxic-appearing or diaphoretic.  HENT:     Head: Normocephalic and atraumatic.     Mouth/Throat:     Mouth: Mucous membranes are moist.     Pharynx: Oropharynx is clear. Uvula midline.     Tonsils: Tonsillar exudate present. 2+ on the right. 2+ on the left.  Cardiovascular:      Rate and Rhythm: Normal rate and regular rhythm.     Heart sounds: Normal heart sounds. No murmur heard.  No friction rub. No gallop.   Pulmonary:     Effort: Pulmonary effort is normal. No accessory muscle usage, prolonged expiration, respiratory distress or retractions.     Comments: Lungs clear to auscultation without adventitious lung sounds. Musculoskeletal:     Cervical back: Normal range of motion and neck supple.  Neurological:     General: No focal deficit present.     Mental Status: She is alert and oriented to person, place, and time.    UC Treatments / Results  Labs (all labs ordered are listed, but only abnormal results are displayed) Labs Reviewed  CULTURE, GROUP A STREP [facility])  POCT RAPID STREP A (OFFICE)    EKG   Radiology No results found.  Procedures Procedures (including critical care time)  Medications Ordered in UC Medications - No data to display  Initial Impression / Assessment and Plan / UC Course  I have reviewed the triage vital signs and the nursing notes.  Pertinent labs & imaging results that were available during my care of the patient were reviewed by me and considered in my medical decision making (see chart for details).    Rapid strep negative.  However, given history and exam, will cover for tonsillitis with amoxicillin.  Other symptomatic treatment discussed.  Return precautions given.  Patient expresses understanding and agrees to plan.  Final Clinical Impressions(s) / UC Diagnoses   Final diagnoses:  Acute tonsillitis, unspecified etiology   ED Prescriptions    Medication Sig Dispense Auth. Provider   amoxicillin (AMOXIL) 500 MG capsule Take 1 capsule (500 mg total) by mouth 2 (two) times daily. 20 capsule Gisell Buehrle V, PA-C   fluconazole (DIFLUCAN) 150 MG tablet Take 1 tablet (150 mg total) by mouth daily. Take second dose 72 hours later if symptoms still persists. 2 tablet Savannah Erbe V, PA-C   lidocaine (XYLOCAINE) 2 % solution Use  as directed 15 mLs in the mouth or throat as needed for mouth pain. 100 mL Belinda Fisher, PA-C     PDMP not reviewed this encounter.   Belinda Fisher, PA-C 10/26/19 0930

## 2019-10-26 NOTE — Discharge Instructions (Signed)
Rapid strep negative. However, given your exam, will cover you empirically for bacterial infection with amoxicillin. As discussed, symptoms can still be due to viral illness/ drainage down your throat. This usually takes 7-10 days to resolve. Start lidocaine for sore throat, do not eat or drink for the next 40 mins after use as it can stunt your gag reflex. You can take over the counter allergy medicine such as Flonase, Zyrtec-D for nasal congestion/drainage. You can use over the counter nasal saline rinse such as neti pot for nasal congestion. Monitor for any worsening of symptoms, swelling of the throat, trouble breathing, trouble swallowing, leaning forward to breath, drooling, go to the emergency department for further evaluation needed.

## 2019-10-26 NOTE — ED Triage Notes (Signed)
Pt c/o sore throat, swollen and tender lymph nodes to neck, and body aches.

## 2019-10-28 LAB — CULTURE, GROUP A STREP (THRC)

## 2020-01-10 ENCOUNTER — Encounter: Payer: Self-pay | Admitting: Obstetrics and Gynecology

## 2020-01-10 ENCOUNTER — Ambulatory Visit (INDEPENDENT_AMBULATORY_CARE_PROVIDER_SITE_OTHER): Payer: No Typology Code available for payment source | Admitting: Obstetrics and Gynecology

## 2020-01-10 ENCOUNTER — Other Ambulatory Visit: Payer: Self-pay

## 2020-01-10 VITALS — BP 122/74 | Ht 68.0 in | Wt 215.0 lb

## 2020-01-10 DIAGNOSIS — N941 Unspecified dyspareunia: Secondary | ICD-10-CM

## 2020-01-10 DIAGNOSIS — N898 Other specified noninflammatory disorders of vagina: Secondary | ICD-10-CM

## 2020-01-10 DIAGNOSIS — N939 Abnormal uterine and vaginal bleeding, unspecified: Secondary | ICD-10-CM

## 2020-01-10 DIAGNOSIS — Z124 Encounter for screening for malignant neoplasm of cervix: Secondary | ICD-10-CM

## 2020-01-10 DIAGNOSIS — R300 Dysuria: Secondary | ICD-10-CM

## 2020-01-10 DIAGNOSIS — Z113 Encounter for screening for infections with a predominantly sexual mode of transmission: Secondary | ICD-10-CM

## 2020-01-10 DIAGNOSIS — N852 Hypertrophy of uterus: Secondary | ICD-10-CM

## 2020-01-10 LAB — HM PAP SMEAR

## 2020-01-10 LAB — WET PREP FOR TRICH, YEAST, CLUE

## 2020-01-10 MED ORDER — METRONIDAZOLE 500 MG PO TABS: 500.0000 mg | ORAL_TABLET | Freq: Two times a day (BID) | ORAL | 0 refills | Status: DC

## 2020-01-10 NOTE — Addendum Note (Signed)
Addended by: Rushie Goltz on: 01/10/2020 03:35 PM   Modules accepted: Orders

## 2020-01-10 NOTE — Progress Notes (Signed)
Elizabeth [facility] 1980/10/17 [id number]  SUBJECTIVE:  39 y.o. G1P1 female new patient presents for evaluation of dyspareunia and abnormal uterine bleeding.  Prior endometrial ablation in 2015 with bilateral salpingectomy at that time for permanent contraception.  Last 6 months she has started to experience some dyspareunia.  Can happen during intercourse but particularly afterwards for several hours there can be achiness in the lower pelvic region.  Also has postcoital bleeding.  She has been with her current sexual partner for a few years and was not really having any sexual discomfort until more recently.  She also has been noticing some malodorous vaginal discharge at times along with light pink-tinged discharge happening almost every other day.  At times the discharge will also have tissue fragments also with a "meaty odor."  She is not having any heavy bleeding but sometimes will note some irregular bleeding as well.  She also gets a sharp stabbing sensation in her lower abdomen at times.  Prior to the ablation she recalls having heavy periods and having to change several tampons throughout the course of 1 work shift in addition to heavy menstrual cramps.  No specific STI concerns with current partner.  She does relay that her partner does work long shifts and typically has worked close with a lot of perspiration and has had some irritations due to the moisture.  No intercourse before he bathes after work. She works as a Psychiatrist.   No current outpatient medications on file.   No current facility-administered medications for this visit.   Allergies: Codeine  No LMP recorded (lmp unknown). Patient has had an ablation.  Past medical history,surgical history, problem list, medications, allergies, family history and social history were all reviewed and documented as reviewed in the EPIC chart.  ROS:   No urinary tract symptoms. GYN ROS: Pertinent positives noted in HPI, otherwise  negative.   OBJECTIVE:  BP 122/74   Ht 5\' 8"  (1.727 m)   Wt 215 lb (97.5 kg)   LMP  (LMP Unknown)   BMI 32.69 kg/m  The patient appears well, alert, oriented x 3, in no distress. Abdomen: Soft, nondistended, tenderness to deeper palpation with voluntary guarding at the right mid to lower quadrant.  No rebound tenderness. PELVIC EXAM: VULVA: normal appearing vulva with no masses, tenderness or lesions, VAGINA: normal appearing vagina with normal color and discharge, no notable odor, no lesions, CERVIX: normal appearing cervix without discharge or lesions, no CMT, UTERUS: Palpates to about 8 to 10 weeks size with tenderness over the fundus,  ADNEXA: normal adnexa in size, nontender and no masses,   PAP: Pap smear done today, thin-prep method, WET MOUNT done - results: +clue cells, no trichomonas, no fake, few WBC, many bacteria, normal epithelial cells  Chaperone: present during the examination  ASSESSMENT:  39 y.o. G1P1 here for evaluation of abnormal uterine bleeding post ablation and dysmenorrhea with dyspareunia  PLAN:  1.  Bacterial vaginosis.  We discussed that this could cause some pelvic inflammation and discomfort and we will treat with metronidazole 500 mg twice daily x7 days, discussed possible side effects and need for avoidance of alcohol during and for 2 days after therapy. 2.  Dyspareunia and postcoital bleeding.  Could be partly due to #1 above.  Pap smear collected. Also will check gonorrhea and chlamydia.  We also discussed possible endometriosis/adenomyosis, particularly considering the abnormal uterine bleeding pattern and history of heavy painful periods prior to the ablation.  On pelvic exam  her uterus is tender at the fundus and slightly enlarged which makes me think she could have fibroids.  Her abdominal exam reveals tenderness above the pelvic area the right side, so it is possible that there is a abdominal wall muscular component to her dyspareunia.  Will  check UA if we have the urine sample available.  We will check a pelvic ultrasound which she will schedule.  3.  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  Patient has returning dyspareunia and abnormal bleeding.  We discussed the phenomenon of post ablation pain particularly with tubal ligation/salpingectomy.  Could trial hormonal OCPs, but she has hesitation due to her mother having significant hormonal sensitivity and mood changes with that, and also she did not feel great on OCPs herself in the past (therefore she had used an IUD for a while).  Also discussed that only definitive treatment for post ablation pain (and also adenomyosis if she has that) would be hysterectomy.  We will check the pelvic ultrasound and make decisions from there.     Theresia Majors MD 01/10/20

## 2020-01-10 NOTE — Addendum Note (Signed)
Addended by: Dayna Barker on: 01/10/2020 03:20 PM   Modules accepted: Orders

## 2020-01-11 LAB — URINALYSIS, COMPLETE W/RFL CULTURE
Bilirubin Urine: NEGATIVE
Glucose, UA: NEGATIVE
Hyaline Cast: NONE SEEN /LPF
Ketones, ur: NEGATIVE
Leukocyte Esterase: NEGATIVE
Nitrites, Initial: NEGATIVE
Protein, ur: NEGATIVE
Specific Gravity, Urine: 1.025 (ref 1.001–1.03)
pH: 5.5 (ref 5.0–8.0)

## 2020-01-11 LAB — PAP THINPREP ASCUS RFLX HPV RFLX TYPE
C. trachomatis RNA, TMA: NOT DETECTED
N. gonorrhoeae RNA, TMA: NOT DETECTED

## 2020-01-11 LAB — CULTURE INDICATED

## 2020-01-11 LAB — URINE CULTURE
MICRO NUMBER:: 10893658
SPECIMEN QUALITY:: ADEQUATE

## 2020-01-12 ENCOUNTER — Other Ambulatory Visit: Payer: Self-pay

## 2020-01-12 ENCOUNTER — Encounter: Payer: Self-pay | Admitting: Obstetrics and Gynecology

## 2020-01-12 ENCOUNTER — Ambulatory Visit: Payer: No Typology Code available for payment source | Admitting: Obstetrics and Gynecology

## 2020-01-12 ENCOUNTER — Ambulatory Visit (INDEPENDENT_AMBULATORY_CARE_PROVIDER_SITE_OTHER): Payer: No Typology Code available for payment source

## 2020-01-12 VITALS — BP 116/76

## 2020-01-12 DIAGNOSIS — N83201 Unspecified ovarian cyst, right side: Secondary | ICD-10-CM

## 2020-01-12 DIAGNOSIS — N852 Hypertrophy of uterus: Secondary | ICD-10-CM | POA: Diagnosis not present

## 2020-01-12 DIAGNOSIS — N800 Endometriosis of uterus: Secondary | ICD-10-CM

## 2020-01-12 DIAGNOSIS — N939 Abnormal uterine and vaginal bleeding, unspecified: Secondary | ICD-10-CM | POA: Diagnosis not present

## 2020-01-12 DIAGNOSIS — N854 Malposition of uterus: Secondary | ICD-10-CM | POA: Diagnosis not present

## 2020-01-12 DIAGNOSIS — Q51810 Arcuate uterus: Secondary | ICD-10-CM

## 2020-01-12 DIAGNOSIS — N941 Unspecified dyspareunia: Secondary | ICD-10-CM

## 2020-01-12 MED ORDER — MEDROXYPROGESTERONE ACETATE 10 MG PO TABS: 10.0000 mg | ORAL_TABLET | Freq: Every day | ORAL | 3 refills | Status: DC

## 2020-01-12 NOTE — Progress Notes (Signed)
   Elizabeth Franklin January 08, 1981 [id number]  SUBJECTIVE:  39 y.o. G1P1 female presents for a pelvic ultrasound to evaluate abnormal uterine bleeding, dyspareunia, and right-sided pelvic pain.  See the note from 01/10/2020 for presentation details.  She was diagnosed with bacterial vaginosis plans to take the prescribed metronidazole after she returns home from out of town this weekend.  She continues to have predictable, irregular light bleeding intermittently.  Current Outpatient Medications  Medication Sig Dispense Refill  . metroNIDAZOLE (FLAGYL) 500 MG tablet Take 1 tablet (500 mg total) by mouth 2 (two) times daily. Do not consume alcohol throughout the course of taking this medication (Patient not taking: Reported on 01/12/2020) 14 tablet 0   No current facility-administered medications for this visit.   Allergies: Codeine  No LMP recorded (lmp unknown). Patient has had an ablation.  Past medical history,surgical history, problem list, medications, allergies, family history and social history were all reviewed and documented as reviewed in the EPIC chart.  ROS: Positives reviewed in HPI, remainder ROS negative    OBJECTIVE:  BP 116/76   LMP  (LMP Unknown)  The patient appears well, alert, oriented x 3, in no distress. PELVIC EXAM: Deferred as this was just performed  Pelvic ultrasound Uterus 9.9 x 6.4 x 4.7 cm, anteverted, general bulky appearance possibly suggestive of adenomyosis Arcuate endometrial cavity.  Normal endometrial thickness 5.5 mm. Bilateral ovaries appear normal, right ovary with a simple thin-walled cyst 3.3 x 2.5 cm. No other adnexal masses.  No free fluid.   ASSESSMENT:  39 y.o. G1P1 with abnormal uterine bleeding status post prior endometrial ablation, dyspareunia and postcoital bleeding, recent diagnosis bacterial vaginosis, findings of enlarged globular uterus possibly suggestive of adenomyosis also with an arcuate endometrial cavity, and a small right ovarian  cyst  PLAN:  1. We discussed the finding of the ovarian cyst appears simple and functional.  It is possible it was bigger and is starting to resorb as she is having less symptoms on that side.  I would recommend repeating a pelvic ultrasound in 3 to 4 months to follow-up the cyst. 2. The abnormal uterine bleeding with prior endometrial ablation may be due to ablation failure possibly with adenomyosis.  It is possible the BV could also be causing that and/or the postcoital spotting she was having.  She had a negative urine culture at her last visit.  Her Pap smear was normal.  To control the bleeding I would recommend trying Provera 10 mg daily for 10 days, she may want to start this after she returns from out of town.  Next step could be tranexamic acid.  I recommend against repeating endometrial ablation as this is not recommended in general and also she has a uterine malformation.  If none of the solutions help her then hysterectomy would be indicated. 3. BV.  She will start taking the metronidazole after returning home.  FU 3-4 mo for ultrasound  Theresia Majors MD 01/12/20

## 2020-05-13 ENCOUNTER — Other Ambulatory Visit: Payer: Self-pay

## 2020-05-13 ENCOUNTER — Ambulatory Visit
Admission: EM | Admit: 2020-05-13 | Discharge: 2020-05-13 | Disposition: A | Discharge status: Home / Self Care | Payer: No Typology Code available for payment source

## 2020-05-13 DIAGNOSIS — Z20822 Contact with and (suspected) exposure to covid-19: Secondary | ICD-10-CM | POA: Diagnosis not present

## 2020-05-13 DIAGNOSIS — R058 Other specified cough: Secondary | ICD-10-CM | POA: Diagnosis not present

## 2020-05-13 MED ORDER — BENZONATATE 100 MG PO CAPS
100.0000 mg | ORAL_CAPSULE | Freq: Three times a day (TID) | ORAL | 0 refills | Status: DC
Start: 2020-05-13 — End: 2020-07-19

## 2020-05-13 MED ORDER — FLUTICASONE PROPIONATE 50 MCG/ACT NA SUSP
1.0000 | Freq: Every day | NASAL | 0 refills | Status: DC
Start: 2020-05-13 — End: 2020-07-19

## 2020-05-13 MED ORDER — CETIRIZINE HCL 10 MG PO TABS
10.0000 mg | ORAL_TABLET | Freq: Every day | ORAL | 0 refills | Status: DC
Start: 2020-05-13 — End: 2020-07-19

## 2020-05-13 NOTE — ED Provider Notes (Signed)
[facility]    CSN: [id number] Arrival date & time: 05/13/20  1243      History   Chief Complaint Chief Complaint  Patient presents with  . Sore Throat  . Nasal Congestion    HPI Elizabeth Franklin is a 40 y.o. female  Presenting for Covid testing: Exposure: son Date of exposure: cohabitate Any fever, symptoms since exposure: Yes-nasal congestion, rhinorrhea, headache, productive cough since Friday.  Endorsing sore throat since this morning.  Denies chest pain, difficulty breathing, fever.  Has taken ibuprofen with some relief.  Past Medical History:  Diagnosis Date  . Anxiety   . Chronic headaches     Patient Active Problem List   Diagnosis Date Noted  . 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 Excessive or frequent menstruation 11/21/2013  . Dysmenorrhea 11/21/2013    Past Surgical History:  Procedure Laterality Date  . APPENDECTOMY    . CERVICAL DISC SURGERY    . DILITATION & CURRETTAGE/HYSTROSCOPY WITH THERMACHOICE ABLATION N/A 01/30/2014   Procedure: DILATATION & CURETTAGE/HYSTEROSCOPY WITH THERMACHOICE ABLATION;  Surgeon: Florian Buff, MD;  Location: [facility];  Service: Gynecology;  Laterality: N/A;  Total Ablation Therapy Time 9 minutes 5 seconds; 86-88 deg C  . ENDOMETRIAL ABLATION    . LAPAROSCOPIC BILATERAL SALPINGECTOMY Bilateral 01/30/2014   Procedure: LAPAROSCOPIC BILATERAL SALPINGECTOMY;  Surgeon: Florian Buff, MD;  Location: [facility];  Service: Gynecology;  Laterality: Bilateral;    OB History    Gravida  1   Para      Term      Preterm      AB      Living  1     SAB      IAB      Ectopic      Multiple      Live Births               Home Medications    Prior to Admission medications   Medication Sig Start Date End Date Taking? Authorizing Provider  benzonatate (TESSALON) 100 MG capsule Take 1 capsule (100 mg total) by mouth every 8 (eight) hours. 05/13/20  Yes Hall-Potvin, [place], PA-C  cetirizine  (ZYRTEC ALLERGY) 10 MG tablet Take 1 tablet (10 mg total) by mouth daily. 05/13/20  Yes Hall-Potvin, [place], PA-C  fluticasone (FLONASE) 50 MCG/ACT nasal spray Place 1 spray into both nostrils daily. 05/13/20  Yes Hall-Potvin, [place], PA-C  ibuprofen (ADVIL) 800 MG tablet Take 800 mg by mouth 4 (four) times daily. 01/15/20   [provider]  medroxyPROGESTERone (PROVERA) 10 MG tablet Take 1 tablet (10 mg total) by mouth daily. Take 10 days each month as needed to control irregular vaginal bleeding 01/12/20   Joseph Pierini, MD    Family History Family History  Problem Relation Age of Onset  . Hypertension Mother   . Mental illness Mother   . Heart failure Father   . Heart disease Father   . Hypertension Father   . Diabetes Brother   . Hyperlipidemia Brother   . Diabetes Paternal Grandmother   . Heart disease Paternal Grandmother   . Breast cancer Paternal Grandmother 59  . Breast cancer Paternal Aunt 13    Social History Social History   Tobacco Use  . Smoking status: Former Smoker    Packs/day: 0.25    Years: 1.00    Pack years: 0.25    Types: Cigarettes    Quit date: 01/26/1999    Years since quitting:  21.3  . Smokeless tobacco: Never Used  Vaping Use  . Vaping Use: Never used  Substance Use Topics  . Alcohol use: Yes    Comment: occassional  . Drug use: No     Allergies   Codeine   Review of Systems Review of Systems  Constitutional: Negative for fatigue and fever.  HENT: Positive for congestion, rhinorrhea and sore throat. Negative for dental problem, ear pain, facial swelling, hearing loss, sinus pain, trouble swallowing and voice change.   Eyes: Negative for photophobia, pain and visual disturbance.  Respiratory: Positive for cough. Negative for shortness of breath.   Cardiovascular: Negative for chest pain and palpitations.  Gastrointestinal: Negative for diarrhea and vomiting.  Musculoskeletal: Negative for arthralgias and myalgias.  Neurological:  Positive for headaches. Negative for dizziness.     Physical Exam Triage Vital Signs ED Triage Vitals  Enc Vitals Group     BP 05/13/20 1342 (!) 149/103     Pulse Rate 05/13/20 1342 75     Resp 05/13/20 1350 17     Temp 05/13/20 1342 98.1 F (36.7 C)     Temp Source 05/13/20 1342 Oral     SpO2 05/13/20 1342 99 %     Weight --      Height --      Head Circumference --      Peak Flow --      Pain Score 05/13/20 1347 5     Pain Loc --      Pain Edu? --      Excl. in GC? --    No data found.  Updated Vital Signs BP (!) 149/103   Pulse 75   Temp 98.1 F (36.7 C) (Oral)   Resp 17   LMP 04/24/2020 (Approximate)   SpO2 99%   Visual Acuity Right Eye Distance:   Left Eye Distance:   Bilateral Distance:    Right Eye Near:   Left Eye Near:    Bilateral Near:     Physical Exam Constitutional:      General: She is not in acute distress. HENT:     Head: Normocephalic and atraumatic.     Right Ear: Tympanic membrane, ear canal and external ear normal.     Left Ear: Tympanic membrane, ear canal and external ear normal.     Mouth/Throat:     Mouth: Mucous membranes are moist.     Pharynx: Oropharynx is clear.  Eyes:     General: No scleral icterus.    Extraocular Movements: Extraocular movements intact.     Conjunctiva/sclera: Conjunctivae normal.     Pupils: Pupils are equal, round, and reactive to light.  Cardiovascular:     Rate and Rhythm: Normal rate.  Pulmonary:     Effort: Pulmonary effort is normal. No respiratory distress.     Breath sounds: No wheezing.  Musculoskeletal:        General: No deformity. Normal range of motion.     Cervical back: Normal range of motion. No rigidity or tenderness.  Lymphadenopathy:     Cervical: No cervical adenopathy.  Skin:    Capillary Refill: Capillary refill takes less than 2 seconds.     Coloration: Skin is not jaundiced.     Findings: No bruising or rash.  Neurological:     Mental Status: She is alert.     Cranial  Nerves: Cranial nerves are intact.     Sensory: Sensation is intact.     Motor: Motor function is intact.  Coordination: Coordination is intact.     Gait: Gait is intact.  Psychiatric:        Mood and Affect: Mood normal.        Behavior: Behavior normal.      UC Treatments / Results  Labs (all labs ordered are listed, but only abnormal results are displayed) Labs Reviewed  NOVEL CORONAVIRUS, NAA    EKG   Radiology No results found.  Procedures Procedures (including critical care time)  Medications Ordered in UC Medications - No data to display  Initial Impression / Assessment and Plan / UC Course  I have reviewed the triage vital signs and the nursing notes.  Pertinent labs & imaging results that were available during my care of the patient were reviewed by me and considered in my medical decision making (see chart for details).     Patient afebrile, nontoxic, with SpO2 99%.  Covid PCR pending.  Patient to quarantine until results are back.  We will treat supportively as outlined below.  Return precautions discussed, patient verbalized understanding and is agreeable to plan. Final Clinical Impressions(s) / UC Diagnoses   Final diagnoses:  Cough with exposure to COVID-19 virus   Discharge Instructions   None    ED Prescriptions    Medication Sig Dispense Auth. Provider   benzonatate (TESSALON) 100 MG capsule Take 1 capsule (100 mg total) by mouth every 8 (eight) hours. 21 capsule Hall-Potvin, [place], PA-C   cetirizine (ZYRTEC ALLERGY) 10 MG tablet Take 1 tablet (10 mg total) by mouth daily. 30 tablet Hall-Potvin, [place], PA-C   fluticasone (FLONASE) 50 MCG/ACT nasal spray Place 1 spray into both nostrils daily. 16 g Hall-Potvin, [place], PA-C     PDMP not reviewed this encounter.   Hall-Potvin, [place], [place] 05/13/20 1442

## 2020-05-13 NOTE — ED Triage Notes (Signed)
Pt c/o congestion, runny nose, HA, productive cough, onset Friday, sore throat onset this morning.  Pt reports her son tested positive for COVID on Thursday.  Denies fever, n/v/d, SOB, loss of taste/smell/appetite.  Last took ibuprofen at 0300

## 2020-05-15 LAB — NOVEL CORONAVIRUS, NAA: SARS-CoV-2, NAA: DETECTED — AB

## 2020-05-15 LAB — SARS-COV-2, NAA 2 DAY TAT

## 2020-07-19 ENCOUNTER — Other Ambulatory Visit (HOSPITAL_BASED_OUTPATIENT_CLINIC_OR_DEPARTMENT_OTHER)
Admission: RE | Admit: 2020-07-19 | Discharge: 2020-07-19 | Disposition: A | Discharge status: Home / Self Care | Payer: No Typology Code available for payment source | Source: Ambulatory Visit | Attending: Nurse Practitioner | Admitting: Nurse Practitioner

## 2020-07-19 ENCOUNTER — Encounter (HOSPITAL_BASED_OUTPATIENT_CLINIC_OR_DEPARTMENT_OTHER): Payer: Self-pay | Admitting: Nurse Practitioner

## 2020-07-19 ENCOUNTER — Other Ambulatory Visit (HOSPITAL_COMMUNITY): Payer: Self-pay

## 2020-07-19 ENCOUNTER — Other Ambulatory Visit: Payer: Self-pay

## 2020-07-19 ENCOUNTER — Other Ambulatory Visit (HOSPITAL_BASED_OUTPATIENT_CLINIC_OR_DEPARTMENT_OTHER): Payer: Self-pay

## 2020-07-19 ENCOUNTER — Ambulatory Visit (INDEPENDENT_AMBULATORY_CARE_PROVIDER_SITE_OTHER): Payer: No Typology Code available for payment source | Admitting: Nurse Practitioner

## 2020-07-19 VITALS — BP 130/87 | HR 77 | Ht 68.0 in | Wt 228.0 lb

## 2020-07-19 DIAGNOSIS — R635 Abnormal weight gain: Secondary | ICD-10-CM

## 2020-07-19 DIAGNOSIS — Z0000 Encounter for general adult medical examination without abnormal findings: Secondary | ICD-10-CM

## 2020-07-19 DIAGNOSIS — I7774 Dissection of vertebral artery: Secondary | ICD-10-CM

## 2020-07-19 DIAGNOSIS — M5412 Radiculopathy, cervical region: Secondary | ICD-10-CM

## 2020-07-19 DIAGNOSIS — Z1231 Encounter for screening mammogram for malignant neoplasm of breast: Secondary | ICD-10-CM

## 2020-07-19 DIAGNOSIS — N393 Stress incontinence (female) (male): Secondary | ICD-10-CM

## 2020-07-19 DIAGNOSIS — K5901 Slow transit constipation: Secondary | ICD-10-CM

## 2020-07-19 DIAGNOSIS — Z91030 Bee allergy status: Secondary | ICD-10-CM | POA: Diagnosis not present

## 2020-07-19 DIAGNOSIS — Z6834 Body mass index (BMI) 34.0-34.9, adult: Secondary | ICD-10-CM

## 2020-07-19 DIAGNOSIS — M542 Cervicalgia: Secondary | ICD-10-CM

## 2020-07-19 HISTORY — DX: Encounter for screening mammogram for malignant neoplasm of breast: Z12.31

## 2020-07-19 HISTORY — DX: Abnormal weight gain: R63.5

## 2020-07-19 HISTORY — DX: Encounter for general adult medical examination without abnormal findings: Z00.00

## 2020-07-19 HISTORY — DX: Body mass index (BMI) 34.0-34.9, adult: Z68.34

## 2020-07-19 HISTORY — DX: Cervicalgia: M54.2

## 2020-07-19 HISTORY — DX: Slow transit constipation: K59.01

## 2020-07-19 LAB — LIPID PANEL
Cholesterol: 177 mg/dL (ref 0–200)
HDL: 71 mg/dL (ref >40)
LDL Cholesterol: 97 mg/dL (ref 0–99)
Total CHOL/HDL Ratio: 2.5 RATIO
Triglycerides: 45 mg/dL (ref <150)
VLDL: 9 mg/dL (ref 0–40)

## 2020-07-19 LAB — CBC WITH DIFFERENTIAL/PLATELET
Abs Immature Granulocytes: 0.02 10*3/uL (ref 0.00–0.07)
Basophils Absolute: 0.1 10*3/uL (ref 0.0–0.1)
Basophils Relative: 1 %
Eosinophils Absolute: 0.1 10*3/uL (ref 0.0–0.5)
Eosinophils Relative: 1 %
HCT: 38.7 % (ref 36.0–46.0)
Hemoglobin: 12.9 g/dL (ref 12.0–15.0)
Immature Granulocytes: 0 %
Lymphocytes Relative: 41 %
Lymphs Abs: 3 10*3/uL (ref 0.7–4.0)
MCH: 31.7 pg (ref 26.0–34.0)
MCHC: 33.3 g/dL (ref 30.0–36.0)
MCV: 95.1 fL (ref 80.0–100.0)
Monocytes Absolute: 0.5 10*3/uL (ref 0.1–1.0)
Monocytes Relative: 6 %
Neutro Abs: 3.7 10*3/uL (ref 1.7–7.7)
Neutrophils Relative %: 51 %
Platelets: 252 10*3/uL (ref 150–400)
RBC: 4.07 MIL/uL (ref 3.87–5.11)
RDW: 13 % (ref 11.5–15.5)
WBC: 7.3 10*3/uL (ref 4.0–10.5)
nRBC: 0 % (ref 0.0–0.2)

## 2020-07-19 LAB — COMPREHENSIVE METABOLIC PANEL
ALT: 13 U/L (ref 0–44)
AST: 13 U/L — ABNORMAL LOW (ref 15–41)
Albumin: 4.5 g/dL (ref 3.5–5.0)
Alkaline Phosphatase: 35 U/L — ABNORMAL LOW (ref 38–126)
Anion gap: 7 (ref 5–15)
BUN: 18 mg/dL (ref 6–20)
CO2: 28 mmol/L (ref 22–32)
Calcium: 8.8 mg/dL — ABNORMAL LOW (ref 8.9–10.3)
Chloride: 104 mmol/L (ref 98–111)
Creatinine, Ser: 0.8 mg/dL (ref 0.44–1.00)
GFR, Estimated: >60 mL/min (ref >60)
Glucose, Bld: 81 mg/dL (ref 70–99)
Potassium: 4.2 mmol/L (ref 3.5–5.1)
Sodium: 139 mmol/L (ref 135–145)
Total Bilirubin: 0.8 mg/dL (ref 0.3–1.2)
Total Protein: 7.5 g/dL (ref 6.5–8.1)

## 2020-07-19 LAB — HIV ANTIBODY (ROUTINE TESTING W REFLEX): HIV Screen 4th Generation wRfx: NONREACTIVE

## 2020-07-19 LAB — HEPATITIS C ANTIBODY: HCV Ab: NONREACTIVE

## 2020-07-19 MED ORDER — EPINEPHRINE 0.3 MG/0.3ML IJ SOAJ
0.3000 mg | INTRAMUSCULAR | 1 refills | Status: AC | PRN
  Filled 2020-07-19: qty 0.6, 1d supply, fill #0

## 2020-07-19 MED ORDER — SEMAGLUTIDE-WEIGHT MANAGEMENT 0.25 MG/0.5ML ~~LOC~~ SOAJ
0.2500 mg | SUBCUTANEOUS | 0 refills | Status: AC
Start: 2020-07-19 — End: 2020-08-20
  Filled 2020-07-19 (×2): qty 2, 28d supply, fill #0

## 2020-07-19 MED ORDER — GABAPENTIN 100 MG PO CAPS
100.0000 mg | ORAL_CAPSULE | Freq: Three times a day (TID) | ORAL | 2 refills | Status: DC | PRN
  Filled 2020-07-19: qty 60, 20d supply, fill #0
  Filled 2021-04-18: qty 60, 20d supply, fill #1

## 2020-07-19 MED ORDER — SEMAGLUTIDE-WEIGHT MANAGEMENT 1.7 MG/0.75ML ~~LOC~~ SOAJ
1.7000 mg | SUBCUTANEOUS | 0 refills | Status: DC
Start: 2020-10-14 — End: 2020-10-26
  Filled 2020-07-19 – 2020-08-20 (×2): qty 3, 28d supply, fill #0

## 2020-07-19 MED ORDER — CYCLOBENZAPRINE HCL 10 MG PO TABS
10.0000 mg | ORAL_TABLET | Freq: Every evening | ORAL | 1 refills | Status: DC | PRN
  Filled 2020-07-19: qty 30, 30d supply, fill #0

## 2020-07-19 MED ORDER — SEMAGLUTIDE-WEIGHT MANAGEMENT 0.5 MG/0.5ML ~~LOC~~ SOAJ
0.5000 mg | SUBCUTANEOUS | 0 refills | Status: AC
  Filled 2020-07-19 – 2020-08-20 (×2): qty 2, 28d supply, fill #0

## 2020-07-19 MED ORDER — ONDANSETRON 4 MG PO TBDP
4.0000 mg | ORAL_TABLET | Freq: Three times a day (TID) | ORAL | 1 refills | Status: DC | PRN
  Filled 2020-07-19: qty 30, 10d supply, fill #0

## 2020-07-19 MED ORDER — SEMAGLUTIDE-WEIGHT MANAGEMENT 1 MG/0.5ML ~~LOC~~ SOAJ
1.0000 mg | SUBCUTANEOUS | 0 refills | Status: AC
Start: 2020-09-15 — End: 2020-10-13
  Filled 2020-07-19 – 2020-08-20 (×2): qty 2, 28d supply, fill #0

## 2020-07-19 MED ORDER — SEMAGLUTIDE-WEIGHT MANAGEMENT 2.4 MG/0.75ML ~~LOC~~ SOAJ
2.4000 mg | SUBCUTANEOUS | 0 refills | Status: DC
Start: 2020-11-12 — End: 2020-10-26
  Filled 2020-07-19 – 2020-08-20 (×2): qty 3, 28d supply, fill #0

## 2020-07-19 NOTE — Assessment & Plan Note (Signed)
Increased body weight with BMI 34.67 today.  She is limited in her ability to exercise regularly due to her chronic neck issues and her weight gain has been steady despite dietary monitoring and calorie counting.  Discussed the option of medication management to help with weight control. A joint decision was made to trial [facility]- she will likely need prior authorization for insurance coverage, but she meets criteria for this.  No history of medullary thyroid carcinoma.  She does have slow transit constipation and we discussed the need to increase water intake and manage constipation well on medication due to the increased risk of constipation with medication. She expressed understanding and wishes to try the medication.  PRN Zofran provided for nausea that often accompanies initial [facility] treatment.  Plan to follow-up in 6 weeks for weight check and monitoring of medication.

## 2020-07-19 NOTE — Assessment & Plan Note (Signed)
See cervical radiculopathy note

## 2020-07-19 NOTE — Assessment & Plan Note (Signed)
New patient to practice. She is due for CPE and labs today.  She is up to date on her vaccinations and pap smear.  Will send order for mammogram as patient will be 40 in 2 days.  Recommend follow-up in 1 year for next CPE.

## 2020-07-19 NOTE — Assessment & Plan Note (Signed)
Mammogram ordered today. She has a family history of breast cancer, therefore [facility] detection recommended starting at age 40.

## 2020-07-19 NOTE — Assessment & Plan Note (Signed)
Bilateral radicular symptoms present in the setting of previous discectomy of C5-C6 cervical spine.  She has been doing recommended exercises, ice, and heat with no relief of symptoms.  It is unclear at this time if the current symptoms are related to the surgery or if this is a new problem.  I will review previous notes and imaging and come up with additional recommendations for imaging based on findings.  At this time recommend TENS unit to the neck.  Refilled flexeril for use at bedtime for severe symptoms.  Will trial gabapentin 100mg  up to TID for radicular symptoms during the day.  Follow-up in 6 weeks.

## 2020-07-19 NOTE — Assessment & Plan Note (Signed)
Reported significant swelling after bee sting with dizziness and loss of consciousness.  Given the severity of symptoms that presented, discussion of an epi pen was had with the patient.  A prescription for epi pen sent to pharmacy with instructions on symptoms that would warrant need to use.  Seek emergency care if symptoms present in the future.

## 2020-07-19 NOTE — Assessment & Plan Note (Signed)
See BMI note.  Plan to start [facility] today with follow-up in 6 weeks.

## 2020-07-19 NOTE — Assessment & Plan Note (Signed)
Regular laboratory screenings part of annual physical exam.  Will make changes to plan of care based on results.

## 2020-07-19 NOTE — Patient Instructions (Addendum)
Thank you for choosing [facility] at [facility] for your Primary Care needs. I am excited for the opportunity to partner with you to meet your health care goals. It was a pleasure meeting you today.   For your information, our office hours are Monday- Friday 8:00 AM - 5:00 PM At this time I am not in the office on Wednesdays.  If you have any needs, please feel free to send a MyChart message or call our office at [phone] and we will respond as quickly as possible.   If you had labs drawn today for your visit, you will receive notification on MyChart as soon as the labs have resulted. You will see these results before me. I ask that you please allow 72 business hours for me to view the results. I will send you a MyChart message with lab results and if there are abnormalities that we need to discuss, you will receive a phone call from the office. If you have not heard anything within 72 hours through MyChart or a phone call, please feel free to contact the office to let us know.   I typically respond to MyChart messages within 24 hours during the work week.  Most messages must be routed to me even when the message is selected directly for me. This may cause a delay in my response. Your needs are very important to me, if you need a faster response, please feel free to call the office and a message will be directed to me.  Due to the routing process, messages sent over the weekend may not get to me until Monday.   If you have non-urgent medical needs after hours, please call the office line and speak to our on-call provider. We share on-call services with another [facility] office, therefore, you may receive a return call from a provider outside of this office.   If you have urgent medical needs after hours, please seek care in an Urgent Care or Emergency Room. Our building has a 24 hour emergency department on the 1st floor for your convenience.   Please do not  hesitate to reach out to [place] with concerns.   Thank you, again, for choosing me as your health care partner. I appreciate your trust and look forward to learning more about you.   Shawna Clamp, DNP, AGNP-c _______________________________________________________________________________________  We will plan to check labs today for: CBC- complete blood count, looks at your white blood cells, red blood cells, and clotting CMP- complete metabolic panel, looks at your liver, kidneys, blood sugar, and electrolytes Lipids- cholesterol, checks your cholesterol levels which can increase your risk of heart attack and stroke if high.   It is recommended that all adult patients receive one time Hepatitis C and HIV testing. I will add these to your labs to have this done to complete this recommendation today.   We recommend beginning breast cancer screening with mammogram at age 56, I will order this for you today to have done in the coming months. This can be done downstairs in the imaging department on the Ground Floor.  They will call you to schedule this.  _________________________________________________________________________________________  Future: We recommend starting colon cancer screening at age 89.  Your next Tdap is due after 09/04/2023 Your last pap smear is shown to be done on 01/10/2020 with normal results. Your next pap is not due until 01/09/2025.  __________________________________________________________  I have sent the [facility] to the pharmacy. It may be  a day or two for the prior authorization to come back. They should call you when it is ready.   I recommend trying a TENS unit on your neck to see if this is helpful.  I have sent in flexeril 10mg . You may cut these in 1/2 if needed.  I have also sent in 100mg  gabapentin. 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   I recommend daily use of metamucil or benefiber to help with  constipation. You can also try a stool softener, like colace, to see if this helps.  Another option is miralax, which can be used every other day. I suggest starting with a 1/2 capful in a drink of your choice and increasing if needed. The [employer] [facility] will likely make your bowel movements less frequent so I recommend starting that before you start the [facility] to try to get things moving.   You can take the Zofran up to every 8 hours as needed for nausea. I recommend keeping this on hand in case you need it on the day or two after you get your shot.     Cervical Radiculopathy  Cervical radiculopathy means that a nerve in the neck (a cervical nerve) is pinched or bruised. This can happen because of an injury to the cervical spine (vertebrae) in the neck, or as a normal part of getting older. This can cause pain or loss of feeling (numbness) that runs from your neck all the way down to your arm and fingers. Often, this condition gets better with rest. Treatment may be needed if the condition does not get better. What are the causes?  A neck injury.  A bulging disk in your spine.  Muscle movements that you cannot control (muscle spasms).  Tight muscles in your neck due to overuse.  Arthritis.  Breakdown in the bones and joints of the spine (spondylosis) due to getting older.  Bone spurs that form near the nerves in the neck. What are the signs or symptoms?  Pain. The pain may: ? Run from the neck to the arm and hand. ? Be very bad or irritating. ? Be worse when you move your neck.  Loss of feeling or tingling in your arm or hand.  Weakness in your arm or hand, in very bad cases. How is this treated? In many cases, treatment is not needed for this condition. With rest, the condition often gets better over time. If treatment is needed, options may include:  Wearing a soft neck collar (cervical collar) for short periods of time, as told by your doctor.  Doing exercises (physical therapy)  to strengthen your neck muscles.  Taking medicines.  Having shots (injections) in your spine, in very bad cases.  Having surgery. This may be needed if other treatments do not help. The type of surgery that is used depends on the cause of your condition. Follow these instructions at home: If you have a soft neck collar:  Wear it as told by your doctor. Remove it only as told by your doctor.  Ask your doctor if you can remove the collar for cleaning and bathing. If you are allowed to remove the collar for cleaning or bathing: ? Follow instructions from your doctor about how to remove the collar safely. ? Clean the collar by wiping it with mild soap and water and drying it completely. ? Take out any removable pads in the collar every 1-2 days. Wash them by  hand with soap and water. Let them air-dry completely before you put them back in the collar. ? Check your skin under the collar for redness or sores. If you see any, tell your doctor. Managing pain  Take over-the-counter and prescription medicines only as told by your doctor.  If told, put ice on the painful area. ? If you have a soft neck collar, remove it as told by your doctor. ? Put ice in a plastic bag. ? Place a towel between your skin and the bag. ? Leave the ice on for 20 minutes, 2-3 times a day.  If using ice does not help, you can try using heat. Use the heat source that your doctor recommends, such as a moist heat pack or a heating pad. ? Place a towel between your skin and the heat source. ? Leave the heat on for 20-30 minutes. ? Remove the heat if your skin turns bright red. This is very important if you are unable to feel pain, heat, or cold. You may have a greater risk of getting burned.  You may try a gentle neck and shoulder rub (massage).      Activity  Rest as needed.  Return to your normal activities as told by your doctor. Ask your doctor what activities are safe for you.  Do exercises as told by your  doctor or physical therapist.  Do not lift anything that is heavier than 10 lb (4.5 kg) until your doctor tells you that it is safe. General instructions  Use a flat pillow when you sleep.  Do not drive while wearing a soft neck collar. If you do not have a soft neck collar, ask your doctor if it is safe to drive while your neck heals.  Ask your doctor if the medicine prescribed to you requires you to avoid driving or using heavy machinery.  Do not use any products that contain nicotine or tobacco, such as cigarettes, e-cigarettes, and chewing tobacco. These can delay healing. If you need help quitting, ask your doctor.  Keep all follow-up visits as told by your doctor. This is important. Contact a doctor if:  Your condition does not get better with treatment. Get help right away if:  Your pain gets worse and is not helped with medicine.  You lose feeling or feel weak in your hand, arm, face, or leg.  You have a high fever.  You have a stiff neck.  You cannot control when you poop or pee (have incontinence).  You have trouble with walking, balance, or talking. Summary  Cervical radiculopathy means that a nerve in the neck is pinched or bruised.  A nerve can get pinched from a bulging disk, arthritis, an injury to the neck, or other causes.  Symptoms include pain, tingling, or loss of feeling that goes from the neck into the arm or hand.  Weakness in your arm or hand can happen in very bad cases.  Treatment may include resting, wearing a soft neck collar, and doing exercises. You might need to take medicines for pain. In very bad cases, shots or surgery may be needed. This information is not intended to replace advice given to you by your health care provider. Make sure you discuss any questions you have with your health care provider. Document Revised: 03/19/2018 Document Reviewed: 03/19/2018 Elsevier Patient Education  2021 [employer]   Fat and Cholesterol  Restricted Eating Plan Getting too much fat and cholesterol in your diet may cause health problems. Choosing  the right foods helps keep your fat and cholesterol at normal levels. This can keep you from getting certain diseases. Your doctor may recommend an eating plan that includes:  Total fat: ______% or less of total calories a day.  Saturated fat: ______% or less of total calories a day.  Cholesterol: less than _________mg a day.  Fiber: ______g a day. What are tips for following this plan? Meal planning  At meals, divide your plate into four equal parts: ? Fill one-half of your plate with vegetables and green salads. ? Fill one-fourth of your plate with whole grains. ? Fill one-fourth of your plate with low-fat (lean) protein foods.  Eat fish that is high in omega-3 fats at least two times a week. This includes mackerel, tuna, sardines, and salmon.  Eat foods that are high in fiber, such as whole grains, beans, apples, broccoli, carrots, peas, and barley. General tips  Work with your doctor to lose weight if you need to.  Avoid: ? Foods with added sugar. ? Fried foods. ? Foods with partially hydrogenated oils.  Limit alcohol intake to no more than 1 drink a day for nonpregnant women and 2 drinks a day for men. One drink equals 12 oz of beer, 5 oz of wine, or 1 oz of hard liquor.   Reading food labels  Check food labels for: ? Trans fats. ? Partially hydrogenated oils. ? Saturated fat (g) in each serving. ? Cholesterol (mg) in each serving. ? Fiber (g) in each serving.  Choose foods with healthy fats, such as: ? Monounsaturated fats. ? Polyunsaturated fats. ? Omega-3 fats.  Choose grain products that have whole grains. Look for the word "whole" as the first word in the ingredient list. Cooking  Cook foods using low-fat methods. These include baking, boiling, grilling, and broiling.  Eat more home-cooked foods. Eat at restaurants and buffets less often.  Avoid  cooking using saturated fats, such as butter, cream, palm oil, palm kernel oil, and coconut oil. Recommended foods Fruits  All fresh, canned (in natural juice), or frozen fruits. Vegetables  Fresh or frozen vegetables (raw, steamed, roasted, or grilled). Green salads. Grains  Whole grains, such as whole wheat or whole grain breads, crackers, cereals, and pasta. Unsweetened oatmeal, bulgur, barley, quinoa, or brown rice. Corn or whole wheat flour tortillas. Meats and other protein foods  Ground beef (85% or leaner), grass-fed beef, or beef trimmed of fat. Skinless chicken or [place]. Ground chicken or [place]. Pork trimmed of fat. All fish and seafood. Egg whites. Dried beans, peas, or lentils. Unsalted nuts or seeds. Unsalted canned beans. Nut butters without added sugar or oil. Dairy  Low-fat or nonfat dairy products, such as skim or 1% milk, 2% or reduced-fat cheeses, low-fat and fat-free ricotta or cottage cheese, or plain low-fat and nonfat yogurt. Fats and oils  Tub margarine without trans fats. Light or reduced-fat mayonnaise and salad dressings. Avocado. Olive, canola, sesame, or safflower oils. The items listed above may not be a complete list of foods and beverages you can eat. Contact a dietitian for more information.   Foods to avoid Fruits  Canned fruit in heavy syrup. Fruit in cream or butter sauce. Fried fruit. Vegetables  Vegetables cooked in cheese, cream, or butter sauce. Fried vegetables. Grains  White bread. White pasta. White rice. Cornbread. Bagels, pastries, and croissants. Crackers and snack foods that contain trans fat and hydrogenated oils. Meats and other protein foods  Fatty cuts of meat. Ribs, chicken wings, bacon, sausage,  bologna, salami, chitterlings, fatback, hot dogs, bratwurst, and packaged lunch meats. Liver and organ meats. Whole eggs and egg yolks. Chicken and [place] with skin. Fried meat. Dairy  Whole or 2% milk, cream, half-and-half, and cream  cheese. Whole milk cheeses. Whole-fat or sweetened yogurt. Full-fat cheeses. Nondairy creamers and whipped toppings. Processed cheese, cheese spreads, and cheese curds. Beverages  Alcohol. Sugar-sweetened drinks such as sodas, lemonade, and fruit drinks. Fats and oils  Butter, stick margarine, lard, shortening, ghee, or bacon fat. Coconut, palm kernel, and palm oils. Sweets and desserts  Corn syrup, sugars, honey, and molasses. Candy. Jam and jelly. Syrup. Sweetened cereals. Cookies, pies, cakes, donuts, muffins, and ice cream. The items listed above may not be a complete list of foods and beverages you should avoid. Contact a dietitian for more information. Summary  Choosing the right foods helps keep your fat and cholesterol at normal levels. This can keep you from getting certain diseases.  At meals, fill one-half of your plate with vegetables and green salads.  Eat high-fiber foods, like whole grains, beans, apples, carrots, peas, and barley.  Limit added sugar, saturated fats, alcohol, and fried foods. This information is not intended to replace advice given to you by your health care provider. Make sure you discuss any questions you have with your health care provider. Document Revised: 08/31/2019 Document Reviewed: 08/31/2019 Elsevier Patient Education  2021 [employer].

## 2020-07-19 NOTE — Progress Notes (Signed)
BP 130/87   Pulse 77   Ht 5\' 8"  (1.727 m)   Wt 228 lb (103.4 kg)   SpO2 100%   BMI 34.67 kg/m    Subjective:    Patient ID: , female    DOB: 1980-06-07, 40 y.o.   MRN: [id number]  HPI: Elizabeth Franklin is a pleasant 40 y.o. female presenting on 07/19/2020 to establish as a new patient and for comprehensive medical examination.  Current medical concerns include:increased weight gain, neuropathic neck pain with radiculopathy, bee sting allergy, and constipation.  She reports that she has slowly increased weight over the past two years since having her neck surgery. She has been monitoring her diet and attempting exercise, but she is limited in what she can do due to the chronic neck pain and neuropathy that she is experiencing at this time. She is concerned with her overall health due to this increased weight gain and her attempts at dietary management have been unsuccessful. She would like to avoid medications with addictive properties due to family history of addiction. She has not tried any medications in the past for this. She is open to discussing options.   She reports that she has constant burning type pain on the left side of her neck posteriorly that has progressively worsened over the past 2 years. In 2020 she underwent a discectomy and shortly thereafter began experiencing left sided neck pain. 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 She reports that this pain is different than what she experienced at that time. She describes the pain as a burning sensation with a tightness in her neck. She is also experiencing burning and tingling down into her 3rd and 4th fingers of both hands. She reports that she has a constant headache. She tells me at this time the pain is worse than it was prior to her surgery and she is awakened almost nightly from the  pain. She does occasionally take a flexeril when the pain is very severe, but otherwise she uses icy hot or a roll-on hemp oil, which she says does seem to help a little. For a while she was going to massage therapy, but eventually this seemed to irritate the nerve pain and made it worse. She also reports that any time she exercises she irritates the nerve and will have severe pain for the next 2-3 days. She reports that this has negatively affected her life and she is very frustrated with the results. She is not sure if she needs a re-evaluation of the neck at this point or if this is something that she will have to live with.   She has also had an issue with bowel movements over the past several years. She reports that her bowel movements are typically about every 3 days when they were about once a day in the past. She denies abdominal pain, difficult to pass stool, changes in color or size of stool, nausea, or straining to go to the bathroom. She reports that when she does go it feels "normal', but she does feel heavier and bloated on the days she does not have a bowel movement.   She also mentions that her fiance mentioned that her right eye occasionally appears to not be in line with the left eye ("wanders") and that her right eyelid will appear different than the left when  this happens. She reports that she doesn't notice any difference, but thought it was worth mentioning given the neck pains, headaches, and neuropathic pain.   She currently lives with: her fiance. She has one adult son.  She is planning a wedding for this fall.   She reports regular vision exams q1-5y: yes She reports regular dental exams q 58m: yes Her diet consists of: regular She endorses exercise and/or activity of: limited physical activity due to increased neck pain and neuropathic symptoms with exercise and excessive movement.  She works at: [employer] at [employer] in [place] department  She endorses occasional  ETOH use  She denies nictoine use She denies illegal substance use    She reports no menstrual periods due to hysterectomy with salpingectomy  Current menopausal symptoms: no She is currently sexually active with one partners. She denies concerns today about STI Contraception choices are: hysterectomy/salpingectomy  She denies concerns about skin changes today She endorses concerns about bowel changes today: see note above She endorses concerns about bladder changes today: occasional bladder leakage due to stress  Depression Screen done today and results listed below:  Depression screen [facility] 2/9 07/19/2020 10/09/2014  Decreased Interest 0 0  Down, Depressed, Hopeless 0 0  PHQ - 2 Score 0 0  Altered sleeping 3 -  Tired, decreased energy 2 -  Change in appetite 0 -  Feeling bad or failure about yourself  0 -  Trouble concentrating 2 -  Moving slowly or fidgety/restless 2 -  Suicidal thoughts 0 -  PHQ-9 Score 9 -  Difficult doing work/chores Somewhat difficult -    She does not have a history of falls. I did not complete a risk assessment for falls. A plan of care for falls was not documented.   Past Medical History:  Past Medical History:  Diagnosis Date  . Anxiety   . Chronic headaches     Surgical History:  Past Surgical History:  Procedure Laterality Date  . APPENDECTOMY    . CERVICAL DISC SURGERY    . DILITATION & CURRETTAGE/HYSTROSCOPY WITH THERMACHOICE ABLATION N/A 01/30/2014   Procedure: DILATATION & CURETTAGE/HYSTEROSCOPY WITH THERMACHOICE ABLATION;  Surgeon: Lazaro Arms, MD;  Location: [facility];  Service: Gynecology;  Laterality: N/A;  Total Ablation Therapy Time 9 minutes 5 seconds; 86-88 deg C  . ENDOMETRIAL ABLATION    . LAPAROSCOPIC BILATERAL SALPINGECTOMY Bilateral 01/30/2014   Procedure: LAPAROSCOPIC BILATERAL SALPINGECTOMY;  Surgeon: Lazaro Arms, MD;  Location: [facility];  Service: Gynecology;  Laterality: Bilateral;    Medications:  No current outpatient  medications on file prior to visit.   No current facility-administered medications on file prior to visit.    Allergies:  Allergies  Allergen Reactions  . Bee Venom Swelling  . Codeine Nausea And Vomiting    Social History:  Social History   Socioeconomic History  . Marital status: Single    Spouse name: Not on file  . Number of children: Not on file  . Years of education: Not on file  . Highest education level: Not on file  Occupational History  . Not on file  Tobacco Use  . Smoking status: Former Smoker    Packs/day: 0.25    Years: 1.00    Pack years: 0.25    Types: Cigarettes    Quit date: 01/26/1999    Years since quitting: 21.4  . Smokeless tobacco: Never Used  Vaping Use  . Vaping Use: Never used  Substance and Sexual Activity  .  Alcohol use: Yes    Comment: occassional  . Drug use: No  . Sexual activity: Yes    Birth control/protection: Surgical    Comment: 1st intercourse 40 yo-More than 5 partners  Other Topics Concern  . Not on file  Social History Narrative  . Not on file   Social Determinants of Health   Financial Resource Strain: Not on file  Food Insecurity: Not on file  Transportation Needs: Not on file  Physical Activity: Not on file  Stress: Not on file  Social Connections: Not on file  Intimate Partner Violence: Not on file   Social History   Tobacco Use  Smoking Status Former Smoker  . Packs/day: 0.25  . Years: 1.00  . Pack years: 0.25  . Types: Cigarettes  . Quit date: 01/26/1999  . Years since quitting: 21.4  Smokeless Tobacco Never Used   Social History   Substance and Sexual Activity  Alcohol Use Yes   Comment: occassional    Family History:  Family History  Problem Relation Age of Onset  . Hypertension Mother   . Mental illness Mother   . Heart failure Father   . Heart disease Father   . Hypertension Father   . Diabetes Brother   . Hyperlipidemia Brother   . Diabetes Paternal Grandmother   . Heart disease  Paternal Grandmother   . Breast cancer Paternal Grandmother [id number]  . Breast cancer Paternal Aunt [id number]    Past medical history, surgical history, medications, allergies, family history and social history reviewed with patient today and changes made to appropriate areas of the chart.   All ROS negative except what is listed above and in the HPI.      Objective:    BP 130/87   Pulse 77   Ht 5\' 8"  (1.727 m)   Wt 228 lb (103.4 kg)   SpO2 100%   BMI 34.67 kg/m   Wt Readings from Last 3 Encounters:  07/19/20 228 lb (103.4 kg)  01/10/20 215 lb (97.5 kg)  05/08/19 195 lb (88.5 kg)    Physical Exam  Results for orders placed or performed in visit on 07/19/20  HM PAP SMEAR  Result Value Ref Range   HM Pap smear Ne   HM PAP SMEAR  Result Value Ref Range   HM Pap smear NILM       Assessment & Plan:   Problem List Items Addressed This Visit      Cardiovascular and Mediastinum   Dissection of vertebral artery (HCC)    History of left sided dissection noted after c5-c6 discectomy. No new signs today. Reportedly resolved with follow-up neurology evaluation. New neck pain is not consistent with the previous symptoms experienced per patient.        Relevant Medications   EPINEPHrine 0.3 mg/0.3 mL IJ SOAJ injection     Digestive   Slow transit constipation    No alarm symptoms present.  Recommend increased water consumption and addition of dietary fiber daily. May also take stool softener or miralax (1/2 dose) every other day to help increase transit of stool.  Once her neck pain is better managed, regular physical activity will likely help with this problem, as well.          Nervous and Auditory   Cervical radiculopathy, chronic    Bilateral radicular symptoms present in the setting of previous discectomy of C5-C6 cervical spine.  She has been doing recommended exercises, ice, and heat with no relief of symptoms.  It is unclear at this time if the current symptoms are related to  the surgery or if this is a new problem.  I will review previous notes and imaging and come up with additional recommendations for imaging based on findings.  At this time recommend TENS unit to the neck.  Refilled flexeril for use at bedtime for severe symptoms.  Will trial gabapentin  up to TID for radicular symptoms during the day.  Follow-up in 6 weeks.       Relevant Medications   Semaglutide-Weight Management 0.25 MG/0.5ML SOAJ   Semaglutide-Weight Management 0.5 MG/0.5ML SOAJ (Start on 08/17/2020)   Semaglutide-Weight Management 1 MG/0.5ML SOAJ (Start on 09/15/2020)   Semaglutide-Weight Management 1.7 MG/0.75ML SOAJ (Start on 10/14/2020)   Semaglutide-Weight Management 2.4 MG/0.75ML SOAJ (Start on 11/12/2020)   cyclobenzaprine (FLEXERIL) 10 MG tablet   gabapentin (NEURONTIN) 100 MG capsule     Other   Encounter for medical examination to establish care - Primary    New patient to practice. She is due for CPE and labs today.  She is up to date on her vaccinations and pap smear.  Will send order for mammogram as patient will be 40 in 2 days.  Recommend follow-up in 1 year for next CPE.       Laboratory tests ordered as part of a complete physical exam (CPE)    Regular laboratory screenings part of annual physical exam.  Will make changes to plan of care based on results.       Relevant Orders   CBC with Differential/Platelet (Completed)   Lipid panel   Comprehensive Metabolic Panel (CMET) (Completed)   Hepatitis C Antibody   HIV Antibody (routine testing w rflx)   Body mass index (BMI) 34.0-34.9, adult    Increased body weight with BMI 34.67 today.  She is limited in her ability to exercise regularly due to her chronic neck issues and her weight gain has been steady despite dietary monitoring and calorie counting.  Discussed the option of medication management to help with weight control. A joint decision was made to trial [facility]- she will likely need prior authorization for  insurance coverage, but she meets criteria for this.  No history of medullary thyroid carcinoma.  She does have slow transit constipation and we discussed the need to increase water intake and manage constipation well on medication due to the increased risk of constipation with medication. She expressed understanding and wishes to try the medication.  PRN Zofran provided for nausea that often accompanies initial [facility] treatment.  Plan to follow-up in 6 weeks for weight check and monitoring of medication.       Relevant Medications   Semaglutide-Weight Management 0.25 MG/0.5ML SOAJ   Semaglutide-Weight Management 0.5 MG/0.5ML SOAJ (Start on 08/17/2020)   Semaglutide-Weight Management 1 MG/0.5ML SOAJ (Start on 09/15/2020)   Semaglutide-Weight Management 1.7 MG/0.75ML SOAJ (Start on 10/14/2020)   Semaglutide-Weight Management 2.4 MG/0.75ML SOAJ (Start on 11/12/2020)   Bilateral posterior neck pain    See cervical radiculopathy note      Relevant Medications   cyclobenzaprine (FLEXERIL) 10 MG tablet   gabapentin (NEURONTIN) 100 MG capsule   Allergy to bee sting    Reported significant swelling after bee sting with dizziness and loss of consciousness.  Given the severity of symptoms that presented, discussion of an epi pen was had with the patient.  A prescription for epi pen sent to pharmacy with instructions on symptoms that would warrant need to use.  Seek emergency care  if symptoms present in the future.       Relevant Medications   EPINEPHrine 0.3 mg/0.3 mL IJ SOAJ injection   Weight gain    See BMI note.  Plan to start [facility] today with follow-up in 6 weeks.       Relevant Medications   Semaglutide-Weight Management 0.25 MG/0.5ML SOAJ   Semaglutide-Weight Management 0.5 MG/0.5ML SOAJ (Start on 08/17/2020)   Semaglutide-Weight Management 1 MG/0.5ML SOAJ (Start on 09/15/2020)   Semaglutide-Weight Management 1.7 MG/0.75ML SOAJ (Start on 10/14/2020)   Semaglutide-Weight Management 2.4  MG/0.75ML SOAJ (Start on 11/12/2020)   Other Relevant Orders   Lipid panel   Comprehensive Metabolic Panel (CMET) (Completed)   Screening mammogram for breast cancer    Mammogram ordered today. She has a family history of breast cancer, therefore Jenilee Franey detection recommended starting at age 57.      Relevant Orders   MM 3D SCREEN BREAST BILATERAL   Stress incontinence in female    Symptoms and presentation consistent with stress incontinence likely due to weak pelvic floor muscles.  Discussed kegal exercises and to help increase pelvic floor muscles. Discussed the option of pelvic PT, but at this time patient would like to wait to try this.  Recommend follow-up if symptoms worsen or if PT is desired.           Follow up plan: Return in about 6 weeks (around 08/30/2020) for weight follow-up.   LABORATORY TESTING:  - Pap smear: up to date Due 01/09/2025 - STI testing: Recommended HIV and Hep C testing performed today  IMMUNIZATIONS:   - Tdap: Tetanus vaccination status reviewed: last tetanus booster within 10 years. Due 10/2029 - Influenza: Up to date 01/30/2021 - Pneumovax: Not applicable - Prevnar: Not applicable - HPV: Not applicable - Zostavax vaccine: Not applicable  - COVID vaccine: 05/10/19, 06/07/19, 04/19/20  SCREENING: -Mammogram: Ordered today  - Colonoscopy: Not applicable  - Bone Density: Not applicable  -Hearing Test: Not applicable  -Spirometry: Not applicable   PATIENT COUNSELING:   For women your age we recommend: A well balanced diet low in saturated fats and cholesterol and high in fiber and vegetables.  Regular physical activity of at least 30 minutes per day at least 5 days out of the week. This does not have to be strenuous activity.  Drink plenty of water and limit beverages with excess sugar such as soda, sweet tea, and juices.   Avoid smoking and nicotine products.  If you drink alcohol, avoid drinking more than one alcoholic beverage per day  If  you are sexually active, utilize monogamy, wise partner selection, and appropriate barrier protection to reduce the chance of sexually transmitted infections.   NEXT PREVENTATIVE PHYSICAL DUE IN 1 YEAR. Return in about 6 weeks (around 08/30/2020) for weight follow-up.

## 2020-07-19 NOTE — Assessment & Plan Note (Signed)
No alarm symptoms present.  Recommend increased water consumption and addition of dietary fiber daily. May also take stool softener or miralax (1/2 dose) every other day to help increase transit of stool.  Once her neck pain is better managed, regular physical activity will likely help with this problem, as well.

## 2020-07-19 NOTE — Assessment & Plan Note (Signed)
Symptoms and presentation consistent with stress incontinence likely due to weak pelvic floor muscles.  Discussed kegal exercises and to help increase pelvic floor muscles. Discussed the option of pelvic PT, but at this time patient would like to wait to try this.  Recommend follow-up if symptoms worsen or if PT is desired.

## 2020-07-19 NOTE — Assessment & Plan Note (Signed)
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 No new signs today. Reportedly resolved with follow-up neurology evaluation. New neck pain is not consistent with the previous symptoms experienced per patient.

## 2020-07-20 ENCOUNTER — Telehealth (HOSPITAL_BASED_OUTPATIENT_CLINIC_OR_DEPARTMENT_OTHER): Payer: Self-pay

## 2020-07-20 ENCOUNTER — Other Ambulatory Visit (HOSPITAL_BASED_OUTPATIENT_CLINIC_OR_DEPARTMENT_OTHER): Payer: Self-pay

## 2020-07-20 NOTE — Telephone Encounter (Signed)
-----   Message from [name], NP sent at 07/20/2020  7:29 AM EST ----- HIV and Hep C routine screening negative.  Lipids look fantastic! Great job!  No signs of liver or kidney dysfunction. Electrolytes looks great.   Blood counts look great.   No changes to plan of care at this time. Plan to repeat labs in 12 months or sooner if needed.

## 2020-07-20 NOTE — Progress Notes (Signed)
HIV and Hep C routine screening negative.  Lipids look fantastic! Great job!  No signs of liver or kidney dysfunction. Electrolytes looks great.   Blood counts look great.   No changes to plan of care at this time. Plan to repeat labs in 12 months or sooner if needed.

## 2020-07-20 NOTE — Telephone Encounter (Signed)
Left message to inform patient that lab results have been reviewed and released via MyChart Instructed patient to contact the office with any questions or concerns.

## 2020-07-31 ENCOUNTER — Encounter (HOSPITAL_BASED_OUTPATIENT_CLINIC_OR_DEPARTMENT_OTHER): Payer: Self-pay | Admitting: Nurse Practitioner

## 2020-08-01 ENCOUNTER — Other Ambulatory Visit (HOSPITAL_BASED_OUTPATIENT_CLINIC_OR_DEPARTMENT_OTHER): Payer: Self-pay | Admitting: Nurse Practitioner

## 2020-08-01 DIAGNOSIS — M542 Cervicalgia: Secondary | ICD-10-CM

## 2020-08-01 DIAGNOSIS — G4486 Cervicogenic headache: Secondary | ICD-10-CM | POA: Insufficient documentation

## 2020-08-01 DIAGNOSIS — Z9889 Other specified postprocedural states: Secondary | ICD-10-CM

## 2020-08-01 DIAGNOSIS — G441 Vascular headache, not elsewhere classified: Secondary | ICD-10-CM

## 2020-08-01 DIAGNOSIS — R519 Headache, unspecified: Secondary | ICD-10-CM

## 2020-08-01 DIAGNOSIS — I7774 Dissection of vertebral artery: Secondary | ICD-10-CM

## 2020-08-01 HISTORY — DX: Cervicalgia: M54.2

## 2020-08-01 HISTORY — DX: Other specified postprocedural states: Z98.890

## 2020-08-01 HISTORY — DX: Headache, unspecified: R51.9

## 2020-08-01 NOTE — Progress Notes (Signed)
CTA of neck ordered for recurrent symptoms associated with previous vertebral artery dissection. Two episodes of severe headache with nausea and vomiting, neck pain, neck stiffness. Patient also has history of cervical disc surgery. After discussion with radiologist, it was recommended CTA of the neck to rule out possible dissection and consider C-spine MRI without contrast for other etiologies. Will begin with CTA imaging and make changes to plan of care per imaging results. Patient is scheduled for evaluation tomorrow 08/02/20 at 1:10.

## 2020-08-02 ENCOUNTER — Encounter (HOSPITAL_BASED_OUTPATIENT_CLINIC_OR_DEPARTMENT_OTHER): Payer: Self-pay | Admitting: Nurse Practitioner

## 2020-08-02 ENCOUNTER — Other Ambulatory Visit: Payer: Self-pay

## 2020-08-02 ENCOUNTER — Telehealth (INDEPENDENT_AMBULATORY_CARE_PROVIDER_SITE_OTHER): Payer: No Typology Code available for payment source | Admitting: Nurse Practitioner

## 2020-08-02 VITALS — Wt 228.0 lb

## 2020-08-02 DIAGNOSIS — I7774 Dissection of vertebral artery: Secondary | ICD-10-CM

## 2020-08-02 DIAGNOSIS — Z9889 Other specified postprocedural states: Secondary | ICD-10-CM | POA: Diagnosis not present

## 2020-08-02 DIAGNOSIS — M542 Cervicalgia: Secondary | ICD-10-CM | POA: Diagnosis not present

## 2020-08-02 DIAGNOSIS — J0100 Acute maxillary sinusitis, unspecified: Secondary | ICD-10-CM | POA: Diagnosis not present

## 2020-08-02 HISTORY — DX: Acute maxillary sinusitis, unspecified: J01.00

## 2020-08-02 MED ORDER — FLUCONAZOLE 150 MG PO TABS: 150.0000 mg | ORAL_TABLET | Freq: Every day | ORAL | 1 refills | Status: DC

## 2020-08-02 MED ORDER — AMOXICILLIN-POT CLAVULANATE 875-125 MG PO TABS: 1.0000 | ORAL_TABLET | Freq: Two times a day (BID) | ORAL | 0 refills | Status: DC

## 2020-08-02 NOTE — Assessment & Plan Note (Addendum)
History of c5-c6 disc replacement with recurrent episodes of left sided burning neck pain. She is also experiencing chronic bilateral tingling/numbness in her 4th and 5th fingers. She has a history of vertebral artery dissection which her current symptoms are more consistent with this as opposed to her disc replacement with the exception of the radicular symptoms.  For now will plan for CTA imaging to rule out vertebral artery dissection. May need to consider MRI of the neck if CTA is negative to rule out issue with her disc replacement.  Patient provided with emergency instructions to follow-up with ED care if stroke like symptoms or severe or worsening symptoms present.  Will make changes to the plan of care based on imaging results.

## 2020-08-02 NOTE — Progress Notes (Signed)
Pt cc is neck pain and headaches.  Pt weight is the same at 228.  Pt reports no no chills or vomiting, but may have a temp last night.

## 2020-08-02 NOTE — Patient Instructions (Signed)
I have placed the order for the CTA for our downstairs imaging department. If you begin to have worsening or severe symptoms or experience any stroke like symptoms please seek emergency care immediately.   We will determine follow-up as soon as I get the results of the CTA.   I have also sent augmentin for the sinus infection. Please let me know if you are not feeling any better by Monday.

## 2020-08-02 NOTE — Progress Notes (Signed)
Virtual Visit via Telephone Note  I connected with  Elizabeth Franklin on 08/02/20 at  1:10 PM EDT by telephone and verified that I am speaking with the correct person using two identifiers.   I discussed the limitations, risks, security and privacy concerns of performing an evaluation and management service by telephone and the availability of in person appointments. I also discussed with the patient that there may be a patient responsible charge related to this service. The patient expressed understanding and agreed to proceed.  Participating parties included in this telephone visit include: The patient and the nurse practitioner listed.  The patient is: At home I am: In the office  Subjective:    CC:   HPI: Elizabeth Franklin is a 40 y.o. year old female presenting today via telephone visit to discuss neck pain that is persistently worsening with repeated episodes of exacerbation. 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 She reports her symptoms are consistent with the dissection symptoms she had in the past. She endorses left sided neck pain and swelling that has occurred a few times over the last several months with her most recent occurrences about 2 weeks apart. The most recent occurrence started Saturday with the left sided neck pain and tightness in her neck. Over the following days she reports the pain has intensified and progressed into a migraine like headache in the occipital region of her head with a roaring sensation in her ears. She describes the pain as an intense burning sensation. She has also experienced nausea and vomiting with her symptoms. She has tried TENS unit, gabapentin, and flexeril for the pain thinking that it could possibly be of musculoskeletal origin. She tells me she is concerned as each episode seems to be worse than the last and they are occurring more frequently.  She denies any vision changes, speech alterations, facial droop,  dizziness, lightheadedness, weakness in her extremities, or new confusion.   She tells me that she has also been experiencing sinus pain and pressure behind her eyes, congestion, rhinorrhea, cough, body aches, and subjective fever with chills that also started over the weekend. She does not feel the neck pain is related to the sinus symptoms as the neck pain has been independent of these symptoms on other occassions, but she does feel the current additional symptoms have made her feel worse. She has had to miss work due to these symptoms. She has had her flu vaccine and COVID vaccines. She has not been around anyone who has the flu that she knows of, but she does work in health care.   Past medical history, Surgical history, Family history not pertinant except as noted below, Social history, Allergies, and medications have been entered into the medical record, reviewed, and corrections made.   Review of Systems:  All review of systems negative except what is listed in the HPI  Objective:    General:  Patient speaking clearly in complete sentences. Audible congestion is noted.  No shortness of breath noted.   Alert and oriented x3.   Normal judgment. She sounds fatigued.  No apparent acute distress.  Impression and Recommendations:   1. Acute maxillary sinusitis, recurrence not specified Symptoms concerning for acute sinusitis. Given that we are at the 5 day mark with worsening symptoms, we will begin treatment with augmentin BID for 5 days.  Diflucan provided for yeast infection symptoms if they should present after antibiotic use.  Patient instructed to follow-up on Monday  if symptoms are not improved.   2. Neck pain with history of cervical spinal surgery History of c5-c6 disc replacement with recurrent episodes of left sided burning neck pain. She is also experiencing chronic bilateral tingling/numbness in her 4th and 5th fingers. She has a history of vertebral artery dissection which  her current symptoms are more consistent with this as opposed to her disc replacement with the exception of the radicular symptoms.  For now will plan for CTA imaging to rule out vertebral artery dissection. May need to consider MRI of the neck if CTA is negative to rule out issue with her disc replacement.  Patient provided with emergency instructions to follow-up with ED care if stroke like symptoms or severe or worsening symptoms present.  Will make changes to the plan of care based on imaging results.     3. Dissection of vertebral artery (HCC) History of left sided vertebral dissection with symptoms consistent to current presentation. This is a change from her recent visit on 03/10 and she has had 2 episodes of this pain in the past 2 weeks.  An order for CTA has been placed and we are awaiting insurance approval for procedure. Based on the results, we may consider an MRI to ensure that the symptoms are not related to misalignment or hardware fail from previous disc replacement. We will await results for further evaluation.  No stroke symptoms present today- instructed patient to seek emergency evaluation immediately if she begins to develop worsening or severe symptoms or symptoms that are consistent with stroke including worst headache of her life, vision changes, weakness, slurred speech, or change in mental status. She is agreeable to plan.  Will follow-up with in person evaluation when results have been obtained.      I discussed the assessment and treatment plan with the patient. The patient was provided an opportunity to ask questions and all were answered. The patient agreed with the plan and demonstrated an understanding of the instructions.   The patient was advised to call back or seek an in-person evaluation if the symptoms worsen or if the condition fails to improve as anticipated.  I provided 22 minutes of non-face-to-face time during this TELEPHONE encounter.    [name], NP

## 2020-08-02 NOTE — Assessment & Plan Note (Addendum)
Symptoms concerning for acute sinusitis. Given that we are at the 5 day mark with worsening symptoms, we will begin treatment with augmentin BID for 5 days.  Diflucan provided for yeast infection symptoms if they should present after antibiotic use.  Patient instructed to follow-up on Monday if symptoms are not improved.

## 2020-08-02 NOTE — Assessment & Plan Note (Signed)
History of left sided vertebral dissection with symptoms consistent to current presentation. This is a change from her recent visit on 03/10 and she has had 2 episodes of this pain in the past 2 weeks.  An order for CTA has been placed and we are awaiting insurance approval for procedure. Based on the results, we may consider an MRI to ensure that the symptoms are not related to misalignment or hardware fail from previous disc replacement. We will await results for further evaluation.  No stroke symptoms present today- instructed patient to seek emergency evaluation immediately if she begins to develop worsening or severe symptoms or symptoms that are consistent with stroke including worst headache of her life, vision changes, weakness, slurred speech, or change in mental status. She is agreeable to plan.  Will follow-up with in person evaluation when results have been obtained.

## 2020-08-03 ENCOUNTER — Ambulatory Visit (HOSPITAL_BASED_OUTPATIENT_CLINIC_OR_DEPARTMENT_OTHER): Payer: No Typology Code available for payment source

## 2020-08-06 ENCOUNTER — Ambulatory Visit (HOSPITAL_BASED_OUTPATIENT_CLINIC_OR_DEPARTMENT_OTHER)
Admission: RE | Admit: 2020-08-06 | Discharge: 2020-08-06 | Disposition: A | Discharge status: Home / Self Care | Payer: No Typology Code available for payment source | Source: Ambulatory Visit | Attending: Nurse Practitioner | Admitting: Nurse Practitioner

## 2020-08-06 ENCOUNTER — Encounter (HOSPITAL_BASED_OUTPATIENT_CLINIC_OR_DEPARTMENT_OTHER): Payer: Self-pay

## 2020-08-06 ENCOUNTER — Other Ambulatory Visit: Payer: Self-pay

## 2020-08-06 ENCOUNTER — Emergency Department (HOSPITAL_BASED_OUTPATIENT_CLINIC_OR_DEPARTMENT_OTHER): Admission: AD | Admit: 2020-08-06 | Payer: No Typology Code available for payment source | Source: Ambulatory Visit

## 2020-08-06 DIAGNOSIS — M542 Cervicalgia: Secondary | ICD-10-CM | POA: Diagnosis present

## 2020-08-06 DIAGNOSIS — M50322 Other cervical disc degeneration at C5-C6 level: Secondary | ICD-10-CM | POA: Diagnosis not present

## 2020-08-06 DIAGNOSIS — G441 Vascular headache, not elsewhere classified: Secondary | ICD-10-CM

## 2020-08-06 DIAGNOSIS — Z9889 Other specified postprocedural states: Secondary | ICD-10-CM | POA: Insufficient documentation

## 2020-08-06 DIAGNOSIS — I7774 Dissection of vertebral artery: Secondary | ICD-10-CM

## 2020-08-06 MED ORDER — IOHEXOL 350 MG/ML SOLN
100.0000 mL | Freq: Once | INTRAVENOUS | Status: AC | PRN
  Administered 2020-08-06: 100 mL via INTRAVENOUS

## 2020-08-06 NOTE — Progress Notes (Signed)
CT shows the vertebral artery dissection from previous imaging has resolved!   It looks like the majority of the issues may be due to the loss of curvature in the neck, which can certainly cause pain and headaches. I recommend formal PT to help restore the curvature of the neck and improve posture.

## 2020-08-07 ENCOUNTER — Encounter (HOSPITAL_BASED_OUTPATIENT_CLINIC_OR_DEPARTMENT_OTHER): Payer: Self-pay | Admitting: Nurse Practitioner

## 2020-08-13 ENCOUNTER — Telehealth (HOSPITAL_BASED_OUTPATIENT_CLINIC_OR_DEPARTMENT_OTHER): Payer: Self-pay

## 2020-08-13 NOTE — Telephone Encounter (Signed)
Received records request from [facility].  Faxed CT Angiography Neck results attn:Jennifer Nurnberger fax number  [phone].

## 2020-08-20 ENCOUNTER — Other Ambulatory Visit (HOSPITAL_BASED_OUTPATIENT_CLINIC_OR_DEPARTMENT_OTHER): Payer: Self-pay

## 2020-08-29 NOTE — Progress Notes (Signed)
Established Patient Office Visit  Subjective:  Patient ID: Elizabeth Franklin, female    DOB: 1981/02/14  Age: 40 y.o. MRN: [id number]  CC: No chief complaint on file.   HPI Elizabeth Franklin is a pleasant 40 year old female patient known to me returning today for follow-up for weight loss. She was first seen on 07/19/2020 to establish care and expressed concerns over weight gain with inability to lose excess weight despite dietary changes and regular physical activity. At her last visit she was started on semaglutide [facility]) weekly injections for weight management.   Her weight at her initial visit was 228 lbs with a BMI of 34.68. At that time her blood pressure was slightly elevated at 130/87. Labs from that visit revealed total cholesterol of 177 with HDL 71 and LDL 97. Her fasting blood glucose was measured at 81. Hemoglobin A1c was not measured due to limited evidence for elevated fasting blood glucose levels.   She started her Wegovy injections 5 weeks ago at the 0.25mg  per week dosing and stepped up to the 0.5mg  weekly dosing at the beginning of this week.   She denies nausea, vomiting, abdominal pain, diarrhea, dizziness,  lightheadedness, fullness in the throat, or changes to her voice since starting the medication.   She endorses a dull headache for 2-3 days after the injection that is aborted with the use of ibuprofen.  She endorses constipation, which is her biggest challenge. She has successful bowel movements with the use of Miralax, however, this does cause quite a bit of cramping and she can only use this when she is off of work due to frequent bowel movements. Her last BM was on Saturday and facilitated with the use of Miralax. She plans to take Miralax again today. She endorses a feeling of fullness. She has increased her water intake to help reduce constipation.   She has not had to take the PRN zofran for intermittent nausea.   Overall she reports that she feels good on the  medication and is pleased with her results. She reports the side effects are not bothersome enough to stop the medication.   Her weight today is 220 with a calculated BMI of 33.45, which is an 8 pound weight loss. Her blood pressure today is lower at 126/92, as well. She reports that her clothing is fitting better and not as tight.   She has followed her dietary plan with decreased caloric intake, healthy food choices, and decreased portion sizes. She endorses fullness faster and has been incorporating healthy snacks into her diet when she is not full enough for a complete meal. She did skip meals on one day and reports that she ended up very hungry and ate a snack, which then filled her up and she was not able to eat dinner. She plans to not do this again, as she realizes the importance of the type of calories to benefit her overall health.   She has not been very physically active. Her biggest limitations of physical activity are fatigue and motivation. She reports that after working a full day she is exhausted and does not feel she has the energy to go to the gym. She did walk one day last week and she enjoyed that. She has plans to begin walking frequently now that the weather has changed.    DERMATITIS She does endorse concerns today with a rash that developed near the medial corner of her left eye on her eye lid and a  second rash in her right axilla. She endorses very sensitive skin with common reactions to perfumes and dyes. She recently used a deodorant that was not perfume free and the axillary rash developed. She rash on her eye developed around the same time, but she is unsure what caused that.   She endorses itching and burning from the rash. She denies oozing, pustules, bleeding, swelling, or drainage.  She has stopped wearing makeup and started with a new moisturizer for her face for sensitive skin, but has not noticed a change.  She reports that the rash on axilla has resolved with the  exception of the single location on the right side.  HEADACHES She reports her headaches are still occurring and have not changed despite medication and exercises. She has not seen neurology for this recently.  She does endorse nausea, vomiting, and sensitivity to light that have been occurring with the headaches recently. She reports these headaches are different than the headaches she associates with [facility]. She has not tried triptans. She denies any alarm symptoms. Her recent imaging was negative.    Past Medical History:  Diagnosis Date  . Anxiety   . Chronic headaches     Past Surgical History:  Procedure Laterality Date  . APPENDECTOMY    . CERVICAL DISC SURGERY    . DILITATION & CURRETTAGE/HYSTROSCOPY WITH THERMACHOICE ABLATION N/A 01/30/2014   Procedure: DILATATION & CURETTAGE/HYSTEROSCOPY WITH THERMACHOICE ABLATION;  Surgeon: Lazaro ArmsLuther H Eure, MD;  Location: [facility];  Service: Gynecology;  Laterality: N/A;  Total Ablation Therapy Time 9 minutes 5 seconds; 86-88 deg C  . ENDOMETRIAL ABLATION    . LAPAROSCOPIC BILATERAL SALPINGECTOMY Bilateral 01/30/2014   Procedure: LAPAROSCOPIC BILATERAL SALPINGECTOMY;  Surgeon: Lazaro ArmsLuther H Eure, MD;  Location: [facility];  Service: Gynecology;  Laterality: Bilateral;    Family History  Problem Relation Age of Onset  . Hypertension Mother   . Mental illness Mother   . Heart failure Father   . Heart disease Father   . Hypertension Father   . Diabetes Brother   . Hyperlipidemia Brother   . Diabetes Paternal Grandmother   . Heart disease Paternal Grandmother   . Breast cancer Paternal Grandmother [id number]  . Breast cancer Paternal Aunt [id number]    Social History   Socioeconomic History  . Marital status: Significant Other    Spouse name: Susette RacerSean Arif  . Number of children: 1  . Years of education: Not on file  . Highest education level: Not on file  Occupational History  . Not on file  Tobacco Use  . Smoking status: Former Smoker    Packs/day:  0.25    Years: 1.00    Pack years: 0.25    Types: Cigarettes    Quit date: 01/26/1999    Years since quitting: 21.6  . Smokeless tobacco: Never Used  Vaping Use  . Vaping Use: Never used  Substance and Sexual Activity  . Alcohol use: Yes    Comment: occassional  . Drug use: No  . Sexual activity: Yes    Partners: Male    Birth control/protection: Surgical    Comment: monogomous relationship  Other Topics Concern  . Not on file  Social History Narrative  . Not on file   Social Determinants of Health   Financial Resource Strain: Not on file  Food Insecurity: Not on file  Transportation Needs: Not on file  Physical Activity: Inactive  . Days of Exercise per Week: 0 days  . Minutes of Exercise per  Session: 0 min  Stress: Not on file  Social Connections: Not on file  Intimate Partner Violence: Not At Risk  . Fear of Current or Ex-Partner: No  . Emotionally Abused: No  . Physically Abused: No  . Sexually Abused: No    Outpatient Medications Prior to Visit  Medication Sig Dispense Refill  . amoxicillin-clavulanate (AUGMENTIN) 875-125 MG tablet Take 1 tablet by mouth 2 (two) times daily. 10 tablet 0  . cyclobenzaprine (FLEXERIL) 10 MG tablet Take 1 tablet (10 mg total) by mouth at bedtime as needed for muscle spasms. 30 tablet 1  . EPINEPHrine 0.3 mg/0.3 mL IJ SOAJ injection Inject 0.3 mg into the muscle as needed for anaphylaxis. 0.6 mL 1  . fluconazole (DIFLUCAN) 150 MG tablet Take 1 tablet (150 mg total) by mouth daily. 1 tablet 1  . gabapentin (NEURONTIN) 100 MG capsule Take 1 capsule (100 mg total) by mouth 3 (three) times daily as needed. 60 capsule 2  . ondansetron (ZOFRAN-ODT) 4 MG disintegrating tablet Take 1 tablet (4 mg total) by mouth every 8 (eight) hours as needed for nausea or vomiting. 30 tablet 1  . Semaglutide-Weight Management 0.5 MG/0.5ML SOAJ Inject 0.5 mg into the skin once a week for 28 days. 2 mL 0  . [START ON 09/15/2020] Semaglutide-Weight Management 1  MG/0.5ML SOAJ Inject 1 mg into the skin once a week for 28 days. 2 mL 0  . [START ON 10/14/2020] Semaglutide-Weight Management 1.7 MG/0.75ML SOAJ Inject 1.7 mg into the skin once a week for 28 days. 3 mL 0  . [START ON 11/12/2020] Semaglutide-Weight Management 2.4 MG/0.75ML SOAJ Inject 2.4 mg into the skin once a week for 28 days. 3 mL 0   No facility-administered medications prior to visit.    Allergies  Allergen Reactions  . Bee Venom Swelling  . Codeine Nausea And Vomiting    ROS Review of Systems See HPI for pertinent positives and negatives.    Objective:    Physical Exam Vitals and nursing note reviewed.  Constitutional:      Appearance: Normal appearance.  HENT:     Head: Normocephalic.  Eyes:     Extraocular Movements: Extraocular movements intact.     Conjunctiva/sclera: Conjunctivae normal.     Pupils: Pupils are equal, round, and reactive to light.  Cardiovascular:     Rate and Rhythm: Normal rate and regular rhythm.     Pulses: Normal pulses.  Pulmonary:     Effort: Pulmonary effort is normal.  Abdominal:     General: Abdomen is flat. There is no distension.     Palpations: Abdomen is soft.     Tenderness: There is no abdominal tenderness.  Musculoskeletal:        General: Normal range of motion.     Cervical back: Normal range of motion.     Right lower leg: No edema.     Left lower leg: No edema.  Skin:    General: Skin is warm and dry.     Capillary Refill: Capillary refill takes less than 2 seconds.     Findings: Rash present.       Neurological:     General: No focal deficit present.     Mental Status: She is alert and oriented to person, place, and time.     Cranial Nerves: No cranial nerve deficit.     Sensory: No sensory deficit.     Motor: No weakness.     Coordination: Coordination normal.  Gait: Gait normal.  Psychiatric:        Mood and Affect: Mood normal.        Behavior: Behavior normal.        Thought Content: Thought content  normal.        Judgment: Judgment normal.     There were no vitals taken for this visit. Wt Readings from Last 3 Encounters:  08/02/20 228 lb (103.4 kg)  07/19/20 228 lb (103.4 kg)  01/10/20 215 lb (97.5 kg)     There are no preventive care reminders to display for this patient.  There are no preventive care reminders to display for this patient.  No results found for: TSH Lab Results  Component Value Date   WBC 7.3 07/19/2020   HGB 12.9 07/19/2020   HCT 38.7 07/19/2020   MCV 95.1 07/19/2020   PLT 252 07/19/2020   Lab Results  Component Value Date   NA 139 07/19/2020   K 4.2 07/19/2020   CO2 28 07/19/2020   GLUCOSE 81 07/19/2020   BUN 18 07/19/2020   CREATININE 0.80 07/19/2020   BILITOT 0.8 07/19/2020   ALKPHOS 35 (L) 07/19/2020   AST 13 (L) 07/19/2020   ALT 13 07/19/2020   PROT 7.5 07/19/2020   ALBUMIN 4.5 07/19/2020   CALCIUM 8.8 (L) 07/19/2020   ANIONGAP 7 07/19/2020   Lab Results  Component Value Date   CHOL 177 07/19/2020   Lab Results  Component Value Date   HDL 71 07/19/2020   Lab Results  Component Value Date   LDLCALC 97 07/19/2020   Lab Results  Component Value Date   TRIG 45 07/19/2020   Lab Results  Component Value Date   CHOLHDL 2.5 07/19/2020   No results found for: HGBA1C    Assessment & Plan:   Problem List Items Addressed This Visit   None     No orders of the defined types were placed in this encounter.   Follow-up: No follow-ups on file.    [name], NP

## 2020-08-30 ENCOUNTER — Other Ambulatory Visit: Payer: Self-pay

## 2020-08-30 ENCOUNTER — Ambulatory Visit (INDEPENDENT_AMBULATORY_CARE_PROVIDER_SITE_OTHER): Payer: No Typology Code available for payment source | Admitting: Nurse Practitioner

## 2020-08-30 ENCOUNTER — Encounter (HOSPITAL_BASED_OUTPATIENT_CLINIC_OR_DEPARTMENT_OTHER): Payer: Self-pay | Admitting: Nurse Practitioner

## 2020-08-30 ENCOUNTER — Other Ambulatory Visit (HOSPITAL_BASED_OUTPATIENT_CLINIC_OR_DEPARTMENT_OTHER): Payer: Self-pay

## 2020-08-30 VITALS — BP 126/92 | HR 93 | Ht 68.0 in | Wt 220.0 lb

## 2020-08-30 DIAGNOSIS — L309 Dermatitis, unspecified: Secondary | ICD-10-CM | POA: Diagnosis not present

## 2020-08-30 DIAGNOSIS — Z7689 Persons encountering health services in other specified circumstances: Secondary | ICD-10-CM | POA: Insufficient documentation

## 2020-08-30 DIAGNOSIS — K5901 Slow transit constipation: Secondary | ICD-10-CM

## 2020-08-30 DIAGNOSIS — G4486 Cervicogenic headache: Secondary | ICD-10-CM

## 2020-08-30 DIAGNOSIS — Z6833 Body mass index (BMI) 33.0-33.9, adult: Secondary | ICD-10-CM

## 2020-08-30 DIAGNOSIS — Z6832 Body mass index (BMI) 32.0-32.9, adult: Secondary | ICD-10-CM | POA: Insufficient documentation

## 2020-08-30 DIAGNOSIS — Z0000 Encounter for general adult medical examination without abnormal findings: Secondary | ICD-10-CM | POA: Insufficient documentation

## 2020-08-30 HISTORY — DX: Dermatitis, unspecified: L30.9

## 2020-08-30 MED ORDER — RIZATRIPTAN BENZOATE 10 MG PO TABS
10.0000 mg | ORAL_TABLET | ORAL | 4 refills | Status: AC | PRN
  Filled 2020-08-30: qty 18, 30d supply, fill #0

## 2020-08-30 MED ORDER — NYSTATIN-TRIAMCINOLONE 100000-0.1 UNIT/GM-% EX CREA
1.0000 "application " | TOPICAL_CREAM | Freq: Two times a day (BID) | CUTANEOUS | 0 refills | Status: DC
  Filled 2020-08-30: qty 30, 10d supply, fill #0
  Filled 2020-08-30: qty 30, 15d supply, fill #0

## 2020-08-30 NOTE — Assessment & Plan Note (Signed)
BMI down from 34.67 to 33.45 today.  See Encounter for weight management for full A&P

## 2020-08-30 NOTE — Assessment & Plan Note (Signed)
Excellent results with 8 pound weight loss in 4 full weeks of treatment. Weight down to 220 pounds and BMI at 33.45 today. Improvement noted in blood pressure, as well.  Patient praised for her efforts and great results! Recommend increasing physical activity to 20 minute walks daily. Avoid skipping meals and utilize high protein high fiber snack options when unable to eat a full meal. Continue to avoid foods high in saturated fats and carbohydrates.  Continue medication dosing increasing every 4 weeks to maximum dose.  Will plan for weight check in 8 weeks or sooner if needed.

## 2020-08-30 NOTE — Assessment & Plan Note (Signed)
Continued issue with constipation possibly worsened with [facility] for weight loss. Using intermittent Miralax as needed effective for bowel movement and increased water consumption and fiber.  No alarm symptoms present today.  Recommend daily use of stool softener and consider 1/2 dose of miralax every other day to prevent severe cramping and excessive bowel movements. It may be beneficial to perform a bowel clean out by utilizing full dose Miralax twice a day for three days in a row then starting on above regimen to keep bowel moving.  Recommend daily walking to help facilitate gastric motility. If symptoms worsen or alarm symptoms present, she will let us know.

## 2020-08-30 NOTE — Assessment & Plan Note (Signed)
Erythematous rash consistent with atopic dermatitis related to irritants from recent use of deodorant. Suspect she may have transferred some of the deodorant near her eye lid given the eruption of the dermatitis in that area. Given the location of the large rash in the axilla, will treat with steroid and antifungal cream to help facilitate healing and prevent yeast infection from forming given the warm, dark, and moist environment. Possible yeast involvement present based on visualization of the surrounding tissue.  Recommend a thin layer of cream applied to clean, dry skin twice a day for 7 days. If the rash has healed, no further use is needed. If rash is present, may repeat treatment after 3 day break.  If used on eye lid, recommend once a day application with a very thin layer and discontinuation after 5 days.  Follow-up if symptoms worsen or fail to improve.

## 2020-08-30 NOTE — Assessment & Plan Note (Signed)
Frequent headaches still occurring with symptoms consistent with migraine type headache. No alarm symptoms present today.  Given that conservative measures and other medications have not been helpful, will trial rizatriptan to see if this is effective as an abortive treatment. She may need prophylactic treatment in addition, but will start with abortive only and re-evaluate based on frequency of headaches.  Patient is agreeable to try this.  Patient aware of warning signs and when to seek emergency attention for headaches.  Recommend follow-up in 8 weeks or sooner if needed.

## 2020-08-30 NOTE — Patient Instructions (Signed)
Recommendations from today's visit:  Keep up the good work!  Remember...  Slow and steady is the best way to lose and keep weight off.   Try to focus on long term lifestyle changes that will last- this will help you not only lose weight, but keep it off all while improving your overall health.   Focus on a minimum of 20 minutes of cardio type activity per day at least 5 days out of the week  Walking at a pace that gets your heart rate up, but allows you to still speak in full sentences  Increase time and distance as you are able  Focus on dietary choices  Read food labels and select options with low saturated fats and low carbohydrates  Avoid fried foods  Avoid beverages with excess calories like energy drinks, soda's, and juice  Eat slowly and chew your food thoroughly to allow your brain to recognize you are full  Allow yourself to stop eating when you are full  Monitor portion sizes   Drink PLENTY of water  Don't skip meals  Celebrate your successes no matter how big or small! You are working hard and your future self will thank you for your efforts...allow your current self to thank you, too!  We will plan to follow-up in 8 weeks for a weight check, but if you have concerns sooner, please let me know!   For the rash- apply a very thin layer around the eye and under the arm up to twice a day for no more than 7 days. May repeat after a 3 day break if rash is still present or comes back.    Calorie Counting for Weight Loss Calories are units of energy. Your body needs a certain number of calories from food to keep going throughout the day. When you eat or drink more calories than your body needs, your body stores the extra calories mostly as fat. When you eat or drink fewer calories than your body needs, your body burns fat to get the energy it needs. Calorie counting means keeping track of how many calories you eat and drink each day. Calorie counting can be helpful if  you need to lose weight. If you eat fewer calories than your body needs, you should lose weight. Ask your health care provider what a healthy weight is for you. For calorie counting to work, you will need to eat the right number of calories each day to lose a healthy amount of weight per week. A dietitian can help you figure out how many calories you need in a day and will suggest ways to reach your calorie goal.  A healthy amount of weight to lose each week is usually 1-2 lb (0.5-0.9 kg). This usually means that your daily calorie intake should be reduced by 500-750 calories.  Eating 1,200-1,500 calories a day can help most women lose weight.  Eating 1,500-1,800 calories a day can help most men lose weight. What do I need to know about calorie counting? Work with your health care provider or dietitian to determine how many calories you should get each day. To meet your daily calorie goal, you will need to:  Find out how many calories are in each food that you would like to eat. Try to do this before you eat.  Decide how much of the food you plan to eat.  Keep a food log. Do this by writing down what you ate and how many calories it had. To successfully  lose weight, it is important to balance calorie counting with a healthy lifestyle that includes regular activity. Where do I find calorie information? The number of calories in a food can be found on a Nutrition Facts label. If a food does not have a Nutrition Facts label, try to look up the calories online or ask your dietitian for help. Remember that calories are listed per serving. If you choose to have more than one serving of a food, you will have to multiply the calories per serving by the number of servings you plan to eat. For example, the label on a package of bread might say that a serving size is 1 slice and that there are 90 calories in a serving. If you eat 1 slice, you will have eaten 90 calories. If you eat 2 slices, you will have  eaten 180 calories.   How do I keep a food log? After each time that you eat, record the following in your food log as soon as possible:  What you ate. Be sure to include toppings, sauces, and other extras on the food.  How much you ate. This can be measured in cups, ounces, or number of items.  How many calories were in each food and drink.  The total number of calories in the food you ate. Keep your food log near you, such as in a pocket-sized notebook or on an app or website on your mobile phone. Some programs will calculate calories for you and show you how many calories you have left to meet your daily goal. What are some portion-control tips?  Know how many calories are in a serving. This will help you know how many servings you can have of a certain food.  Use a measuring cup to measure serving sizes. You could also try weighing out portions on a kitchen scale. With time, you will be able to estimate serving sizes for some foods.  Take time to put servings of different foods on your favorite plates or in your favorite bowls and cups so you know what a serving looks like.  Try not to eat straight from a food's packaging, such as from a bag or box. Eating straight from the package makes it hard to see how much you are eating and can lead to overeating. Put the amount you would like to eat in a cup or on a plate to make sure you are eating the right portion.  Use smaller plates, glasses, and bowls for smaller portions and to prevent overeating.  Try not to multitask. For example, avoid watching TV or using your computer while eating. If it is time to eat, sit down at a table and enjoy your food. This will help you recognize when you are full. It will also help you be more mindful of what and how much you are eating. What are tips for following this plan? Reading food labels  Check the calorie count compared with the serving size. The serving size may be smaller than what you are used  to eating.  Check the source of the calories. Try to choose foods that are high in protein, fiber, and vitamins, and low in saturated fat, trans fat, and sodium. Shopping  Read nutrition labels while you shop. This will help you make healthy decisions about which foods to buy.  Pay attention to nutrition labels for low-fat or fat-free foods. These foods sometimes have the same number of calories or more calories than the full-fat versions.  They also often have added sugar, starch, or salt to make up for flavor that was removed with the fat.  Make a grocery list of lower-calorie foods and stick to it. Cooking  Try to cook your favorite foods in a healthier way. For example, try baking instead of frying.  Use low-fat dairy products. Meal planning  Use more fruits and vegetables. One-half of your plate should be fruits and vegetables.  Include lean proteins, such as chicken, [place], and fish. Lifestyle Each week, aim to do one of the following:  150 minutes of moderate exercise, such as walking.  75 minutes of vigorous exercise, such as running. General information  Know how many calories are in the foods you eat most often. This will help you calculate calorie counts faster.  Find a way of tracking calories that works for you. Get creative. Try different apps or programs if writing down calories does not work for you. What foods should I eat?  Eat nutritious foods. It is better to have a nutritious, high-calorie food, such as an avocado, than a food with few nutrients, such as a bag of potato chips.  Use your calories on foods and drinks that will fill you up and will not leave you hungry soon after eating. ? Examples of foods that fill you up are nuts and nut butters, vegetables, lean proteins, and high-fiber foods such as whole grains. High-fiber foods are foods with more than 5 g of fiber per serving.  Pay attention to calories in drinks. Low-calorie drinks include water and  unsweetened drinks. The items listed above may not be a complete list of foods and beverages you can eat. Contact a dietitian for more information.   What foods should I limit? Limit foods or drinks that are not good sources of vitamins, minerals, or protein or that are high in unhealthy fats. These include:  Candy.  Other sweets.  Sodas, specialty coffee drinks, alcohol, and juice. The items listed above may not be a complete list of foods and beverages you should avoid. Contact a dietitian for more information. How do I count calories when eating out?  Pay attention to portions. Often, portions are much larger when eating out. Try these tips to keep portions smaller: ? Consider sharing a meal instead of getting your own. ? If you get your own meal, eat only half of it. Before you start eating, ask for a container and put half of your meal into it. ? When available, consider ordering smaller portions from the menu instead of full portions.  Pay attention to your food and drink choices. Knowing the way food is cooked and what is included with the meal can help you eat fewer calories. ? If calories are listed on the menu, choose the lower-calorie options. ? Choose dishes that include vegetables, fruits, whole grains, low-fat dairy products, and lean proteins. ? Choose items that are boiled, broiled, grilled, or steamed. Avoid items that are buttered, battered, fried, or served with cream sauce. Items labeled as crispy are usually fried, unless stated otherwise. ? Choose water, low-fat milk, unsweetened iced tea, or other drinks without added sugar. If you want an alcoholic beverage, choose a lower-calorie option, such as a glass of wine or light beer. ? Ask for dressings, sauces, and syrups on the side. These are usually high in calories, so you should limit the amount you eat. ? If you want a salad, choose a garden salad and ask for grilled meats. Avoid extra toppings  such as bacon, cheese,  or fried items. Ask for the dressing on the side, or ask for olive oil and vinegar or lemon to use as dressing.  Estimate how many servings of a food you are given. Knowing serving sizes will help you be aware of how much food you are eating at restaurants. Where to find more information  Centers for Disease Control and Prevention: FootballExhibition.com.br  [place] Department of Agriculture: WrestlingReporter.dk Summary  Calorie counting means keeping track of how many calories you eat and drink each day. If you eat fewer calories than your body needs, you should lose weight.  A healthy amount of weight to lose per week is usually 1-2 lb (0.5-0.9 kg). This usually means reducing your daily calorie intake by 500-750 calories.  The number of calories in a food can be found on a Nutrition Facts label. If a food does not have a Nutrition Facts label, try to look up the calories online or ask your dietitian for help.  Use smaller plates, glasses, and bowls for smaller portions and to prevent overeating.  Use your calories on foods and drinks that will fill you up and not leave you hungry shortly after a meal. This information is not intended to replace advice given to you by your health care provider. Make sure you discuss any questions you have with your health care provider. Document Revised: 06/09/2019 Document Reviewed: 06/09/2019 Elsevier Patient Education  2021 [employer].   The Journal of Orthopaedic and Sports Physical Therapy, 44(10), 748. FishingAward.fi.2014.0506">  How to Increase Your Level of Physical Activity Getting regular physical activity is important for your overall health and well-being. Most people do not get enough exercise. There are easy ways to increase your level of physical activity, even if you have not been very active in the past or if you are just starting out. How can increasing my physical activity affect me? Physical activity has many short-term and long-term  benefits. Being active on a regular basis can improve your physical and mental health as well as provide other benefits. Physical health benefits  Helping you lose weight or maintain a healthy weight.  Strengthening your muscles and bones.  Reducing your risk of certain long-term (chronic) diseases, including heart disease, cancer, and diabetes.  Being able to move around more easily and for longer periods of time without getting tired (increased stamina).  Improving your ability to fight off illness (enhanced immunity).  Being able to sleep better.  Helping you stay healthy as you get older, including: ? Helping you stay mobile, or capable of walking and moving around. ? Preventing accidents, such as falls. ? Increasing life expectancy. Mental health benefits  Boosting your mood and improving your self-esteem.  Lowering your chance of having mental health problems, such as depression or anxiety.  Helping you feel good about your body. Other benefits  Finding new sources of fun and enjoyment.  Meeting new people who share a common interest. What steps can I take to be more physically active? Getting started  If you have a chronic illness or have not been active for a while, check with your health care provider about how to get started. Ask your health care provider what activities are safe for you.  Start out slowly. Walking or doing some simple chair exercises is a good place to start, especially if you have not been active before or for a long time.  Set goals that you can work toward. Ask your health care provider  how much exercise is best for you. In general, most adults should: ? Do moderate-intensity exercise for at least 150 minutes each week (30 minutes on most days of the week) or vigorous exercise for at least 75 minutes each week, or a combination of these.  Moderate-intensity exercise can include walking at a quick pace, biking, yoga, water aerobics, or  gardening.  Vigorous exercise involves activities that take more effort, such as jogging or running, playing sports, swimming laps, or jumping rope. ? Do strength exercises on at least 2 days each week. This can include weight lifting, body weight exercises, and resistance-band exercises.  Consider using a fitness tracker, such as a mobile phone app or a device worn like a watch, that will count the number of steps you take each day. Many people strive to reach 10,000 steps a day. Choosing activities  Try to find activities that you enjoy. You are more likely to commit to an exercise routine if it does not feel like a chore.  If you have bone or joint problems, choose low-impact exercises, like walking or swimming.  Use these tips for being successful with an exercise plan: ? Find a workout partner for accountability. ? Join a group or class, such as an aerobics class, cycling class, or sports team. ? Make family time active. Go for a walk, bike, or swim. ? Include a variety of exercises each week. Being active in your daily routines Besides your formal exercise plans, you can find ways to do physical activity during your daily routines, such as:  Walking or biking to work or to the store.  Taking the stairs instead of the elevator.  Parking farther away from the door at work or at the store.  Planning walking meetings.  Walking around while you are on the phone.   Where to find more information  Centers for Disease Control and Prevention: CampusCasting.com.pt  President's Council on Fitness, Sports & Nutrition: www.fitness.gov  ChooseMyPlate: https://ball-collins.biz/ Contact a health care provider if:  You have headaches, muscle aches, or joint pain.  You feel dizzy or light-headed while exercising.  You faint.  You have chest pain while exercising. Summary  Exercise benefits your mind and body at any age, even if you are just starting out.  If you have a  chronic illness or have not been active for a while, check with your health care provider before increasing your physical activity.  Choose activities that are safe and enjoyable for you. Ask your health care provider what activities are safe for you.  Start slowly. Tell your health care provider if you have problems as you start to increase your activity level. This information is not intended to replace advice given to you by your health care provider. Make sure you discuss any questions you have with your health care provider. Document Revised: 01/31/2019 Document Reviewed: 11/22/2018 Elsevier Patient Education  2021 [employer].

## 2020-08-31 ENCOUNTER — Other Ambulatory Visit (HOSPITAL_BASED_OUTPATIENT_CLINIC_OR_DEPARTMENT_OTHER): Payer: Self-pay

## 2020-09-03 ENCOUNTER — Other Ambulatory Visit (HOSPITAL_BASED_OUTPATIENT_CLINIC_OR_DEPARTMENT_OTHER): Payer: Self-pay

## 2020-10-25 ENCOUNTER — Ambulatory Visit (HOSPITAL_BASED_OUTPATIENT_CLINIC_OR_DEPARTMENT_OTHER): Payer: No Typology Code available for payment source | Admitting: Nurse Practitioner

## 2020-10-26 ENCOUNTER — Other Ambulatory Visit (HOSPITAL_BASED_OUTPATIENT_CLINIC_OR_DEPARTMENT_OTHER): Payer: Self-pay

## 2020-10-26 ENCOUNTER — Ambulatory Visit (INDEPENDENT_AMBULATORY_CARE_PROVIDER_SITE_OTHER): Payer: No Typology Code available for payment source | Admitting: Nurse Practitioner

## 2020-10-26 ENCOUNTER — Encounter (HOSPITAL_BASED_OUTPATIENT_CLINIC_OR_DEPARTMENT_OTHER): Payer: Self-pay | Admitting: Nurse Practitioner

## 2020-10-26 ENCOUNTER — Ambulatory Visit (HOSPITAL_BASED_OUTPATIENT_CLINIC_OR_DEPARTMENT_OTHER)
Admission: RE | Admit: 2020-10-26 | Discharge: 2020-10-26 | Disposition: A | Discharge status: Home / Self Care | Payer: No Typology Code available for payment source | Source: Ambulatory Visit | Attending: Nurse Practitioner | Admitting: Nurse Practitioner

## 2020-10-26 ENCOUNTER — Other Ambulatory Visit: Payer: Self-pay

## 2020-10-26 VITALS — BP 112/80 | HR 68 | Resp 12 | Ht 68.0 in | Wt 211.6 lb

## 2020-10-26 DIAGNOSIS — W108XXA Fall (on) (from) other stairs and steps, initial encounter: Secondary | ICD-10-CM | POA: Diagnosis not present

## 2020-10-26 DIAGNOSIS — R635 Abnormal weight gain: Secondary | ICD-10-CM | POA: Diagnosis not present

## 2020-10-26 DIAGNOSIS — M533 Sacrococcygeal disorders, not elsewhere classified: Secondary | ICD-10-CM | POA: Insufficient documentation

## 2020-10-26 DIAGNOSIS — Z6832 Body mass index (BMI) 32.0-32.9, adult: Secondary | ICD-10-CM

## 2020-10-26 DIAGNOSIS — Z7689 Persons encountering health services in other specified circumstances: Secondary | ICD-10-CM

## 2020-10-26 HISTORY — DX: Fall (on) (from) other stairs and steps, initial encounter: W10.8XXA

## 2020-10-26 MED ORDER — SEMAGLUTIDE-WEIGHT MANAGEMENT 2.4 MG/0.75ML ~~LOC~~ SOAJ
2.4000 mg | SUBCUTANEOUS | 6 refills | Status: AC
  Filled 2020-10-26: qty 3, 28d supply, fill #0
  Filled 2021-01-04: qty 6, 56d supply, fill #0

## 2020-10-26 NOTE — Assessment & Plan Note (Signed)
Fall down concrete pool stairs with landing on concrete on sacrum Endorses pain with touch- ecchymosis and edema present to sacrum on exam Will send for x-ray exam today  No alarm signs present Will wait until results received to determine course of action Recommend ibuprofen and ice to area to help with inflammation and pain.  F/U if symptoms worsen or fail to improve.

## 2020-10-26 NOTE — Progress Notes (Signed)
Established Patient Office Visit  Subjective:  Patient ID: Elizabeth Franklin, female    DOB: 12-01-1980  Age: 40 y.o. MRN: [id number]  CC: No chief complaint on file.   HPI Elizabeth Franklin presents for follow-up for weight management medication.  In March of this year she was started on Wegovy for BMI of 35.67. One month f/u she was down to 33.45. Today her BMI is  32.17  She reports she is taking medication as prescribed: She just started on the 1.7mg  dose. She reports minimal side effects of dull headache the day after the injection that resolves with Ibuprofen She reports her energy levels are good, she is sleeping well, she has adjusted her diet and denies any nausea and vomiting.  She does report aversion to smells of certain foods, mostly high fat goods.  She reports over all she feels "fantastic" and is very pleased with the results.  She also reports her neck pain has significantly diminished and she accounts this to weight loss and decreased pressure on her neck and shoulders.   Fall She endorses a fall one week ago. While stepping into the pool she slipped on the top step and fell onto concrete and landed on her bottom. She endorses pain in the sacral region with any pressure applied or when she is laying flat on her back. She has no changes to bowel or bladder habits. No pain down her legs. No pain while sitting. When she does experience pain she endorses "intense electrical type pain".  Past Medical History:  Diagnosis Date   Acute maxillary sinusitis 08/02/2020   Anxiety    Body mass index (BMI) 34.0-34.9, adult 07/19/2020   Cephalalgia 08/01/2020   Chronic headaches    Dermatitis 08/30/2020   Excessive or frequent menstruation 11/21/2013   Laboratory tests ordered as part of a complete physical exam (CPE) 07/19/2020   Neck pain with history of cervical spinal surgery 08/01/2020   Screening mammogram for breast cancer 07/19/2020   Slow transit constipation 07/19/2020   Weight  gain 07/19/2020    Past Surgical History:  Procedure Laterality Date   APPENDECTOMY     CERVICAL DISC SURGERY     DILITATION & CURRETTAGE/HYSTROSCOPY WITH THERMACHOICE ABLATION N/A 01/30/2014   Procedure: DILATATION & CURETTAGE/HYSTEROSCOPY WITH THERMACHOICE ABLATION;  Surgeon: Lazaro Arms, MD;  Location: [facility];  Service: Gynecology;  Laterality: N/A;  Total Ablation Therapy Time 9 minutes 5 seconds; 86-88 deg C   ENDOMETRIAL ABLATION     LAPAROSCOPIC BILATERAL SALPINGECTOMY Bilateral 01/30/2014   Procedure: LAPAROSCOPIC BILATERAL SALPINGECTOMY;  Surgeon: Lazaro Arms, MD;  Location: [facility];  Service: Gynecology;  Laterality: Bilateral;    Family History  Problem Relation Age of Onset   Hypertension Mother    Mental illness Mother    Heart failure Father    Heart disease Father    Hypertension Father    Diabetes Brother    Hyperlipidemia Brother    Diabetes Paternal Grandmother    Heart disease Paternal Grandmother    Breast cancer Paternal Grandmother 104   Breast cancer Paternal Aunt 35    Social History   Socioeconomic History   Marital status: Significant Other    Spouse name: Susette Racer   Number of children: 1   Years of education: Not on file   Highest education level: Not on file  Occupational History   Not on file  Tobacco Use   Smoking status: Former    Packs/day: 0.25  Years: 1.00    Pack years: 0.25    Types: Cigarettes    Quit date: 01/26/1999    Years since quitting: 21.7   Smokeless tobacco: Never  Vaping Use   Vaping Use: Never used  Substance and Sexual Activity   Alcohol use: Yes    Comment: occassional   Drug use: No   Sexual activity: Yes    Partners: Male    Birth control/protection: Surgical    Comment: monogomous relationship  Other Topics Concern   Not on file  Social History Narrative   Not on file   Social Determinants of Health   Financial Resource Strain: Not on file  Food Insecurity: Not on file  Transportation Needs:  Not on file  Physical Activity: Inactive   Days of Exercise per Week: 0 days   Minutes of Exercise per Session: 0 min  Stress: Not on file  Social Connections: Not on file  Intimate Partner Violence: Not At Risk   Fear of Current or Ex-Partner: No   Emotionally Abused: No   Physically Abused: No   Sexually Abused: No    Outpatient Medications Prior to Visit  Medication Sig Dispense Refill   cyclobenzaprine (FLEXERIL) 10 MG tablet Take 1 tablet (10 mg total) by mouth at bedtime as needed for muscle spasms. 30 tablet 1   EPINEPHrine 0.3 mg/0.3 mL IJ SOAJ injection Inject 0.3 mg into the muscle as needed for anaphylaxis. 0.6 mL 1   gabapentin (NEURONTIN) 100 MG capsule Take 1 capsule (100 mg total) by mouth 3 (three) times daily as needed. 60 capsule 2   nystatin-triamcinolone (MYCOLOG II) cream Apply to the affected area 2 (two) times daily. 30 g 0   ondansetron (ZOFRAN-ODT) 4 MG disintegrating tablet Take 1 tablet (4 mg total) by mouth every 8 (eight) hours as needed for nausea or vomiting. 30 tablet 1   rizatriptan (MAXALT) 10 MG tablet Take 1 tablet (10 mg total) by mouth as needed for migraine. May repeat in 2 hours if needed 18 tablet 4   Semaglutide-Weight Management 1.7 MG/0.75ML SOAJ Inject 1.7 mg into the skin once a week for 28 days. 3 mL 0   [START ON 11/12/2020] Semaglutide-Weight Management 2.4 MG/0.75ML SOAJ Inject 2.4 mg into the skin once a week for 28 days. 3 mL 0   No facility-administered medications prior to visit.    Allergies  Allergen Reactions   Bee Venom Swelling   Codeine Nausea And Vomiting    ROS Review of Systems All review of systems negative except what is listed in the HPI    Objective:    Physical Exam Vitals and nursing note reviewed.  Constitutional:      Appearance: Normal appearance.  HENT:     Head: Normocephalic and atraumatic.  Eyes:     Extraocular Movements: Extraocular movements intact.     Conjunctiva/sclera: Conjunctivae normal.      Pupils: Pupils are equal, round, and reactive to light.  Cardiovascular:     Rate and Rhythm: Normal rate and regular rhythm.     Pulses: Normal pulses.     Heart sounds: Normal heart sounds.  Pulmonary:     Effort: Pulmonary effort is normal.     Breath sounds: Normal breath sounds.  Abdominal:     General: Abdomen is flat. Bowel sounds are normal. There is no distension.     Palpations: Abdomen is soft.     Tenderness: There is no abdominal tenderness. There is no right CVA tenderness,  left CVA tenderness, guarding or rebound.  Musculoskeletal:        General: Swelling and tenderness present. Normal range of motion.     Cervical back: Normal range of motion.       Back:     Right lower leg: No edema.     Left lower leg: No edema.  Skin:    General: Skin is warm and dry.     Capillary Refill: Capillary refill takes less than 2 seconds.  Neurological:     General: No focal deficit present.     Mental Status: She is alert and oriented to person, place, and time.  Psychiatric:        Mood and Affect: Mood normal.        Behavior: Behavior normal.        Thought Content: Thought content normal.        Judgment: Judgment normal.    BP 112/80   Pulse 68   Wt 211 lb 9.6 oz (96 kg)   BMI 32.17 kg/m  Wt Readings from Last 3 Encounters:  10/26/20 211 lb 9.6 oz (96 kg)  08/30/20 220 lb (99.8 kg)  08/02/20 228 lb (103.4 kg)     Assessment & Plan:   Problem List Items Addressed This Visit     RESOLVED: Weight gain - Primary   Relevant Medications   Semaglutide-Weight Management 2.4 MG/0.75ML SOAJ (Start on 11/12/2020)   Encounter for weight management    Doing excellent on 1.7mg  Wegovy with minimal side effects- 17lb weight loss in 3 months! Excellent lifestyle changes with decreased portion sizing and healthy intake Activity levels have increased with decreased pain in neck r/t weight loss.  Continue Wegovy and plan to increase to 2.4mg  in 3 weeks. Refills provided for 6  months F/U in 6 months for weight check or sooner if needed        Fall down stairs    Fall down concrete pool stairs with landing on concrete on sacrum Endorses pain with touch- ecchymosis and edema present to sacrum on exam Will send for x-ray exam today  No alarm signs present Will wait until results received to determine course of action Recommend ibuprofen and ice to area to help with inflammation and pain.  F/U if symptoms worsen or fail to improve.        Relevant Orders   DG Sacrum/Coccyx   Sacral back pain    Fall down concrete pool stairs with landing on concrete on sacrum Endorses pain with touch- ecchymosis and edema present to sacrum on exam Will send for x-ray exam today  No alarm signs present Will wait until results received to determine course of action Recommend ibuprofen and ice to area to help with inflammation and pain.  F/U if symptoms worsen or fail to improve.        Relevant Orders   DG Sacrum/Coccyx   Other Visit Diagnoses     BMI 32.0-32.9,adult       Relevant Medications   Semaglutide-Weight Management 2.4 MG/0.75ML SOAJ (Start on 11/12/2020)       Meds ordered this encounter  Medications   Semaglutide-Weight Management 2.4 MG/0.75ML SOAJ    Sig: Inject 2.4 mg into the skin once a week for 28 days.    Dispense:  3 mL    Refill:  6    Follow-up: Return in about 6 months (around 04/27/2021) for Wt Loss.    [name], NP

## 2020-10-26 NOTE — Patient Instructions (Addendum)
PLAN  I am SO PROUD OF YOU!!!!  Medication: Continue with 1.7 dose for three more weeks When you increase to 2.4 dose, if you have side effects let me know and we can bump you back down to the 1.7 dose. I have sent in refills for 6 more months If you have any concerns between now and then let me know  Recommendations: Continue to monitor diet  Continue to work on physical activity Fatty foods can make nausea symptoms worse with this medication- avoiding these foods and over eating can help Drink plenty of water- at least 64 ounces of water a day Avoid excess non-nutritional calories with soda and juices  Follow-Up: 6 months  Wt today 211.6 BMI 32.17 Starting wt 228.9 BMI 35.68

## 2020-10-26 NOTE — Assessment & Plan Note (Signed)
Doing excellent on 1.7mg  Wegovy with minimal side effects- 17lb weight loss in 3 months! Excellent lifestyle changes with decreased portion sizing and healthy intake Activity levels have increased with decreased pain in neck r/t weight loss.  Continue Wegovy and plan to increase to 2.4mg  in 3 weeks. Refills provided for 6 months F/U in 6 months for weight check or sooner if needed

## 2020-10-26 NOTE — Assessment & Plan Note (Signed)
Fall down concrete pool stairs with landing on concrete on sacrum Endorses pain with touch- ecchymosis and edema present to sacrum on exam Will send for x-ray exam today  No alarm signs present Will wait until results received to determine course of action Recommend ibuprofen and ice to area to help with inflammation and pain.  F/U if symptoms worsen or fail to improve.  

## 2020-10-29 NOTE — Progress Notes (Signed)
No abnormalities noted on x-ray- will monitor

## 2020-11-13 ENCOUNTER — Other Ambulatory Visit (HOSPITAL_BASED_OUTPATIENT_CLINIC_OR_DEPARTMENT_OTHER): Payer: Self-pay | Admitting: Nurse Practitioner

## 2020-11-13 ENCOUNTER — Encounter (HOSPITAL_BASED_OUTPATIENT_CLINIC_OR_DEPARTMENT_OTHER): Payer: Self-pay | Admitting: Nurse Practitioner

## 2020-11-13 ENCOUNTER — Other Ambulatory Visit (HOSPITAL_BASED_OUTPATIENT_CLINIC_OR_DEPARTMENT_OTHER): Payer: Self-pay

## 2020-11-13 DIAGNOSIS — Z6834 Body mass index (BMI) 34.0-34.9, adult: Secondary | ICD-10-CM

## 2020-11-13 DIAGNOSIS — Z6833 Body mass index (BMI) 33.0-33.9, adult: Secondary | ICD-10-CM

## 2020-11-13 DIAGNOSIS — R635 Abnormal weight gain: Secondary | ICD-10-CM

## 2020-11-13 MED ORDER — SEMAGLUTIDE-WEIGHT MANAGEMENT 1.7 MG/0.75ML ~~LOC~~ SOAJ
1.7000 mg | SUBCUTANEOUS | 3 refills | Status: DC
  Filled 2020-11-13: qty 3, 28d supply, fill #0

## 2020-11-22 ENCOUNTER — Other Ambulatory Visit (HOSPITAL_BASED_OUTPATIENT_CLINIC_OR_DEPARTMENT_OTHER): Payer: Self-pay

## 2020-12-27 ENCOUNTER — Encounter (HOSPITAL_BASED_OUTPATIENT_CLINIC_OR_DEPARTMENT_OTHER): Payer: Self-pay | Admitting: Nurse Practitioner

## 2021-01-02 ENCOUNTER — Ambulatory Visit (INDEPENDENT_AMBULATORY_CARE_PROVIDER_SITE_OTHER): Payer: No Typology Code available for payment source

## 2021-01-02 ENCOUNTER — Other Ambulatory Visit: Payer: Self-pay

## 2021-01-02 DIAGNOSIS — Z1231 Encounter for screening mammogram for malignant neoplasm of breast: Secondary | ICD-10-CM

## 2021-01-03 ENCOUNTER — Ambulatory Visit (INDEPENDENT_AMBULATORY_CARE_PROVIDER_SITE_OTHER): Payer: No Typology Code available for payment source | Admitting: Nurse Practitioner

## 2021-01-03 ENCOUNTER — Other Ambulatory Visit (HOSPITAL_BASED_OUTPATIENT_CLINIC_OR_DEPARTMENT_OTHER): Payer: Self-pay

## 2021-01-03 ENCOUNTER — Encounter (HOSPITAL_BASED_OUTPATIENT_CLINIC_OR_DEPARTMENT_OTHER): Payer: Self-pay | Admitting: Nurse Practitioner

## 2021-01-03 VITALS — BP 133/91 | HR 73 | Ht 68.0 in | Wt 202.6 lb

## 2021-01-03 DIAGNOSIS — F413 Other mixed anxiety disorders: Secondary | ICD-10-CM | POA: Insufficient documentation

## 2021-01-03 DIAGNOSIS — Z7689 Persons encountering health services in other specified circumstances: Secondary | ICD-10-CM | POA: Diagnosis not present

## 2021-01-03 DIAGNOSIS — F411 Generalized anxiety disorder: Secondary | ICD-10-CM | POA: Diagnosis not present

## 2021-01-03 MED ORDER — SERTRALINE HCL 50 MG PO TABS
50.0000 mg | ORAL_TABLET | Freq: Every day | ORAL | 3 refills | Status: DC
  Filled 2021-01-03: qty 30, 30d supply, fill #0

## 2021-01-03 NOTE — Progress Notes (Signed)
Established Patient Office Visit  Subjective:  Patient ID: Elizabeth Franklin, female    DOB: 08/03/1980  Age: 40 y.o. MRN: [id number]  CC:  Chief Complaint  Patient presents with   Weight Check    Patient states Reginal Lutes is not working as well.  Patient states she feels hungry often.  Patient states she is getting married in 2 months and is overwhelmed.    HPI [name] presents for management of weight loss medication and increased anxiety.  Weight loss [name] has been on [place] for several months now and has had excellent results up until recently. She reports that she is currently up to 2.7 mg dose and is no longer experiencing feelings of fullness or decreased appetite with medication. She is also experiencing increased cravings for sweet and fatty foods. She reports that she is getting married in approximately 2 months and is very concerned that the medication is not working for her any longer. She has tried other medications in the past and has not tolerated them well.  Anxiety [name] tells me that she is feeling overwhelmed and anxious about her upcoming wedding in about 2 months. She tells me that she does not wish to be on as needed medications as she has a family history of negative experiences with these medications. She is interested in something daily to help stabilize her mood She denies any self-harm or SI at this time.   Past Medical History:  Diagnosis Date   Acute maxillary sinusitis 08/02/2020   Anxiety    Bilateral posterior neck pain 07/19/2020   Body mass index (BMI) 34.0-34.9, adult 07/19/2020   Cephalalgia 08/01/2020   Chronic headaches    Dermatitis 08/30/2020   Excessive or frequent menstruation 11/21/2013   Laboratory tests ordered as part of a complete physical exam (CPE) 07/19/2020   Neck pain with history of cervical spinal surgery 08/01/2020   Screening mammogram for breast cancer 07/19/2020   Slow transit constipation 07/19/2020   Weight gain  07/19/2020    Past Surgical History:  Procedure Laterality Date   APPENDECTOMY     CERVICAL DISC SURGERY     DILITATION & CURRETTAGE/HYSTROSCOPY WITH THERMACHOICE ABLATION N/A 01/30/2014   Procedure: DILATATION & CURETTAGE/HYSTEROSCOPY WITH THERMACHOICE ABLATION;  Surgeon: Lazaro Arms, MD;  Location: [facility];  Service: Gynecology;  Laterality: N/A;  Total Ablation Therapy Time 9 minutes 5 seconds; 86-88 deg C   ENDOMETRIAL ABLATION     LAPAROSCOPIC BILATERAL SALPINGECTOMY Bilateral 01/30/2014   Procedure: LAPAROSCOPIC BILATERAL SALPINGECTOMY;  Surgeon: Lazaro Arms, MD;  Location: [facility];  Service: Gynecology;  Laterality: Bilateral;    Family History  Problem Relation Age of Onset   Hypertension Mother    Mental illness Mother    Heart failure Father    Heart disease Father    Hypertension Father    Diabetes Brother    Hyperlipidemia Brother    Diabetes Paternal Grandmother    Heart disease Paternal Grandmother    Breast cancer Paternal Grandmother 49   Breast cancer Paternal Aunt 68    Social History   Socioeconomic History   Marital status: Significant Other    Spouse name: Susette Racer   Number of children: 1   Years of education: Not on file   Highest education level: Not on file  Occupational History   Not on file  Tobacco Use   Smoking status: Former    Packs/day: 0.25    Years: 1.00    Pack years:  0.25    Types: Cigarettes    Quit date: 01/26/1999    Years since quitting: 21.9   Smokeless tobacco: Never  Vaping Use   Vaping Use: Never used  Substance and Sexual Activity   Alcohol use: Yes    Comment: occassional   Drug use: No   Sexual activity: Yes    Partners: Male    Birth control/protection: Surgical    Comment: monogomous relationship  Other Topics Concern   Not on file  Social History Narrative   Not on file   Social Determinants of Health   Financial Resource Strain: Not on file  Food Insecurity: Not on file  Transportation Needs: Not  on file  Physical Activity: Inactive   Days of Exercise per Week: 0 days   Minutes of Exercise per Session: 0 min  Stress: Not on file  Social Connections: Not on file  Intimate Partner Violence: Not At Risk   Fear of Current or Ex-Partner: No   Emotionally Abused: No   Physically Abused: No   Sexually Abused: No    Outpatient Medications Prior to Visit  Medication Sig Dispense Refill   cyclobenzaprine (FLEXERIL) 10 MG tablet Take 1 tablet (10 mg total) by mouth at bedtime as needed for muscle spasms. 30 tablet 1   EPINEPHrine 0.3 mg/0.3 mL IJ SOAJ injection Inject 0.3 mg into the muscle as needed for anaphylaxis. 0.6 mL 1   gabapentin (NEURONTIN) 100 MG capsule Take 1 capsule (100 mg total) by mouth 3 (three) times daily as needed. 60 capsule 2   nystatin-triamcinolone (MYCOLOG II) cream Apply to the affected area 2 (two) times daily. 30 g 0   ondansetron (ZOFRAN-ODT) 4 MG disintegrating tablet Take 1 tablet (4 mg total) by mouth every 8 (eight) hours as needed for nausea or vomiting. 30 tablet 1   rizatriptan (MAXALT) 10 MG tablet Take 1 tablet (10 mg total) by mouth as needed for migraine. May repeat in 2 hours if needed 18 tablet 4   Semaglutide-Weight Management 1.7 MG/0.75ML SOAJ Inject 1.7 mg into the skin once a week. 3 mL 3   No facility-administered medications prior to visit.    Allergies  Allergen Reactions   Bee Venom Swelling   Codeine Nausea And Vomiting    ROS Review of Systems See HPI for pertinent positives and negatives   Objective:    Physical Exam Vitals and nursing note reviewed.  Constitutional:      General: She is not in acute distress.    Appearance: Normal appearance.  HENT:     Head: Normocephalic and atraumatic.  Eyes:     Extraocular Movements: Extraocular movements intact.     Conjunctiva/sclera: Conjunctivae normal.     Pupils: Pupils are equal, round, and reactive to light.  Cardiovascular:     Rate and Rhythm: Normal rate.   Pulmonary:     Effort: Pulmonary effort is normal.  Musculoskeletal:        General: Normal range of motion.     Cervical back: Normal range of motion.  Skin:    General: Skin is warm and dry.     Capillary Refill: Capillary refill takes less than 2 seconds.  Neurological:     General: No focal deficit present.     Mental Status: She is alert and oriented to person, place, and time.  Psychiatric:        Mood and Affect: Mood normal.        Behavior: Behavior normal.  Thought Content: Thought content normal.        Judgment: Judgment normal.    BP (!) 133/91   Pulse 73   Ht 5\' 8"  (1.727 m)   Wt 202 lb 9.6 oz (91.9 kg)   SpO2 100%   BMI 30.81 kg/m  Wt Readings from Last 3 Encounters:  01/03/21 202 lb 9.6 oz (91.9 kg)  10/26/20 211 lb 9.6 oz (96 kg)  08/30/20 220 lb (99.8 kg)     There are no preventive care reminders to display for this patient.   There are no preventive care reminders to display for this patient.  No results found for: TSH Lab Results  Component Value Date   WBC 7.3 07/19/2020   HGB 12.9 07/19/2020   HCT 38.7 07/19/2020   MCV 95.1 07/19/2020   PLT 252 07/19/2020   Lab Results  Component Value Date   NA 139 07/19/2020   K 4.2 07/19/2020   CO2 28 07/19/2020   GLUCOSE 81 07/19/2020   BUN 18 07/19/2020   CREATININE 0.80 07/19/2020   BILITOT 0.8 07/19/2020   ALKPHOS 35 (L) 07/19/2020   AST 13 (L) 07/19/2020   ALT 13 07/19/2020   PROT 7.5 07/19/2020   ALBUMIN 4.5 07/19/2020   CALCIUM 8.8 (L) 07/19/2020   ANIONGAP 7 07/19/2020   Lab Results  Component Value Date   CHOL 177 07/19/2020   Lab Results  Component Value Date   HDL 71 07/19/2020   Lab Results  Component Value Date   LDLCALC 97 07/19/2020   Lab Results  Component Value Date   TRIG 45 07/19/2020   Lab Results  Component Value Date   CHOLHDL 2.5 07/19/2020   No results found for: HGBA1C    Assessment & Plan:   Problem List Items Addressed This Visit      Encounter for weight management    She has been doing excellent on Wegovy with minimal side effects.  She has had a significant weight loss with this medication which has seemed to have plateaued for her. At this time is unclear of the etiology of the plateau. Possible increase in anxiety and stress could be contributing. Recommend continuing the medication at 2.4 mg and monitor caloric intake to less than 1500 cal/day. Recommend increase water consumption for promotion of fullness. Patient encouraged by her success as she has lost an additional 10 pounds in the last 2 months! Recommend increased activity to help with weight and mood.      Generalized anxiety disorder - Primary    Symptoms and presentation consistent with general anxiety disorder most likely related to upcoming and increased stressors during this period of time. No red flags present today. She would like to avoid benzodiazepines if possible. Recommend starting daily sertraline 50 mg/day at bedtime. We will plan to follow-up in approximately 4 weeks or sooner if needed to evaluate for response. Recommend increase physical activity to help clear the mind. Progressive muscle relaxation may be also helpful for anxiety symptoms.      Relevant Medications   sertraline (ZOLOFT) 50 MG tablet    Meds ordered this encounter  Medications   sertraline (ZOLOFT) 50 MG tablet    Sig: Take 1 tablet (50 mg total) by mouth at bedtime.    Dispense:  30 tablet    Refill:  3    Follow-up: Return in about 4 weeks (around 01/31/2021) for virtual visit mood.    02/02/2021, NP

## 2021-01-03 NOTE — Assessment & Plan Note (Signed)
Symptoms and presentation consistent with general anxiety disorder most likely related to upcoming and increased stressors during this period of time. No red flags present today. She would like to avoid benzodiazepines if possible. Recommend starting daily sertraline 50 mg/day at bedtime. We will plan to follow-up in approximately 4 weeks or sooner if needed to evaluate for response. Recommend increase physical activity to help clear the mind. Progressive muscle relaxation may be also helpful for anxiety symptoms.

## 2021-01-03 NOTE — Assessment & Plan Note (Signed)
She has been doing excellent on Wegovy with minimal side effects.  She has had a significant weight loss with this medication which has seemed to have plateaued for her. At this time is unclear of the etiology of the plateau. Possible increase in anxiety and stress could be contributing. Recommend continuing the medication at 2.4 mg and monitor caloric intake to less than 1500 cal/day. Recommend increase water consumption for promotion of fullness. Patient encouraged by her success as she has lost an additional 10 pounds in the last 2 months! Recommend increased activity to help with weight and mood.

## 2021-01-03 NOTE — Patient Instructions (Signed)
Taking the medicine as directed and not missing any doses is one of the best things you can do to treat your anxiety.  Here are some things to keep in mind: Side effects (stomach upset, some increased anxiety) may happen before you notice a benefit.  These side effects typically go away over time. Please give the medication a full 4 weeks before stopping due to mild side effects. Changes to your dose of medicine or a change in medication all together is sometimes necessary. We will have close follow-up until your dose and medication are correct and your symptoms are improved.  Many people will notice an improvement within two weeks but the full effect of the medication can take up to 4-6 weeks. Please don't get discouraged.  Stopping the medication when you start feeling better often results in a return of symptoms. Most people need to be on medication at least 6-12 months. Please do not stop this medication without speaking to me or another provider first. Some medications must be tapered off. If you start having thoughts of hurting yourself or others after starting this medicine, please call me immediately or seek immediate assistance at the [facility] located at [address], [place]. They are open 24 hours a day, 7 days a week and specialize in mental health crisis. [phone]

## 2021-01-04 ENCOUNTER — Other Ambulatory Visit (HOSPITAL_BASED_OUTPATIENT_CLINIC_OR_DEPARTMENT_OTHER): Payer: Self-pay

## 2021-01-16 ENCOUNTER — Encounter (HOSPITAL_BASED_OUTPATIENT_CLINIC_OR_DEPARTMENT_OTHER): Payer: Self-pay | Admitting: Nurse Practitioner

## 2021-02-12 ENCOUNTER — Other Ambulatory Visit: Payer: Self-pay

## 2021-02-12 ENCOUNTER — Emergency Department (HOSPITAL_COMMUNITY): Payer: No Typology Code available for payment source

## 2021-02-12 ENCOUNTER — Emergency Department (HOSPITAL_COMMUNITY)
Admission: EM | Admit: 2021-02-12 | Discharge: 2021-02-12 | Disposition: A | Discharge status: Home / Self Care | Payer: No Typology Code available for payment source | Attending: Emergency Medicine | Admitting: Emergency Medicine

## 2021-02-12 ENCOUNTER — Ambulatory Visit
Admission: RE | Admit: 2021-02-12 | Discharge: 2021-02-12 | Disposition: A | Discharge status: Nursing Home (Includes ICF) | Payer: No Typology Code available for payment source | Source: Ambulatory Visit | Attending: Internal Medicine | Admitting: Internal Medicine

## 2021-02-12 VITALS — BP 138/95 | HR 78 | Temp 98.0°F | Resp 16

## 2021-02-12 DIAGNOSIS — M549 Dorsalgia, unspecified: Secondary | ICD-10-CM | POA: Diagnosis not present

## 2021-02-12 DIAGNOSIS — R0789 Other chest pain: Secondary | ICD-10-CM

## 2021-02-12 DIAGNOSIS — Z5321 Procedure and treatment not carried out due to patient leaving prior to being seen by health care provider: Secondary | ICD-10-CM | POA: Diagnosis not present

## 2021-02-12 LAB — BASIC METABOLIC PANEL
Anion gap: 10 (ref 5–15)
BUN: 14 mg/dL (ref 6–20)
CO2: 26 mmol/L (ref 22–32)
Calcium: 8.7 mg/dL — ABNORMAL LOW (ref 8.9–10.3)
Chloride: 102 mmol/L (ref 98–111)
Creatinine, Ser: 1 mg/dL (ref 0.44–1.00)
GFR, Estimated: >60 mL/min (ref >60)
Glucose, Bld: 90 mg/dL (ref 70–99)
Potassium: 3.7 mmol/L (ref 3.5–5.1)
Sodium: 138 mmol/L (ref 135–145)

## 2021-02-12 LAB — CBC
HCT: 38.7 % (ref 36.0–46.0)
Hemoglobin: 12.9 g/dL (ref 12.0–15.0)
MCH: 32.1 pg (ref 26.0–34.0)
MCHC: 33.3 g/dL (ref 30.0–36.0)
MCV: 96.3 fL (ref 80.0–100.0)
Platelets: 256 10*3/uL (ref 150–400)
RBC: 4.02 MIL/uL (ref 3.87–5.11)
RDW: 12.5 % (ref 11.5–15.5)
WBC: 8.3 10*3/uL (ref 4.0–10.5)
nRBC: 0 % (ref 0.0–0.2)

## 2021-02-12 LAB — I-STAT BETA HCG BLOOD, ED (MC, WL, AP ONLY): I-stat hCG, quantitative: <5 m[IU]/mL (ref <5)

## 2021-02-12 LAB — TROPONIN I (HIGH SENSITIVITY)
Troponin I (High Sensitivity): 3 ng/L (ref <18)
Troponin I (High Sensitivity): 2 ng/L (ref <18)

## 2021-02-12 NOTE — ED Triage Notes (Signed)
Pt arrives via EMS from [facility] with back/chest tightness since yesterday that radiates up her left neck and head. EMS gave 324 ASA and 1 nitro. Pain has improved. Hx of left vertebral dissection.

## 2021-02-12 NOTE — Discharge Instructions (Addendum)
Patient was sent to the hospital via EMS.  

## 2021-02-12 NOTE — ED Provider Notes (Signed)
Emergency Medicine Provider Triage Evaluation Note  Elizabeth Franklin , a 40 y.o. female  was evaluated in triage.  Pt complains of chest pain and pressure that radiated between her scapula that began yesterday afternoon. Hx of truamatic vertebral dissection  Review of Systems  Positive: CP, tightness Negative: SOB  Physical Exam  BP 131/89 (BP Location: Right Arm)   Pulse 80   Temp 99.1 F (37.3 C)   Resp 16   SpO2 99%  Gen:   Awake, no distress   Resp:  Normal effort  MSK:   Moves extremities wthout difficulty  Other:  RRR, CTAB  Medical Decision Making  Medically screening exam initiated at 3:47 PM.  Appropriate orders placed.  Elizabeth Franklin was informed that the remainder of the evaluation will be completed by another provider, this initial triage assessment does not replace that evaluation, and the importance of remaining in the ED until their evaluation is complete.     Elizabeth Benders, PA-C 02/12/21 1552    Elizabeth Logan, DO 02/14/21 1005

## 2021-02-12 NOTE — ED Triage Notes (Signed)
Chest pain and tightness starting yesterday and keeping her awake through the night, radiates through to her shoulder blades. Father had CABG with first MI at 47, grandmother died of MI in early 67s, brother with a. fib. Denies SOB, nausea, smoking. States she's been under more emotional stress recently with life stressors.

## 2021-02-12 NOTE — ED Notes (Signed)
Pt stated that she did not want to wait anymore

## 2021-02-12 NOTE — ED Notes (Signed)
Patient is being discharged from the Urgent Care and sent to the Emergency Department via EMS . 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 No ASA given, NP aware. Patient is aware and verbalizes understanding of plan of care. EMS here at this time. Vitals:   02/12/21 1412  BP: (!) 138/95  Pulse: 78  Resp: 16  Temp: 98 F (36.7 C)  SpO2: 99%

## 2021-02-12 NOTE — ED Provider Notes (Signed)
[facility]    CSN: [id number] Arrival date & time: 02/12/21  1357      History   Chief Complaint Chief Complaint  Patient presents with   Chest Pain    HPI Elizabeth Franklin is a 40 y.o. female.   Patient presents with chest pain and tightness that started yesterday.  Patient reports that chest pain has worsened over the past few hours and kept her awake throughout the night.  Pain radiates through shoulders back to shoulder blades.  Pain is constant and is rated 7/10 on a pain scale.  Patient is not able to adequately characterize pain.  Denies any history of cardiac issues.  Patient does have history of vertebral dissection.  Patient also has associated headache to left side of head that radiates down face into neck area.  Pain is not exacerbated by any factors.  Significant family history of father who had CABG at age 42 and grandmother who died of an MI in her 59s.  Patient denies shortness of breath, dizziness, nausea, vomiting, blurred vision.  Patient denies history of smoking.  Patient does report that she has been under some emotional stress and life stressors recently as well.   Chest Pain  Past Medical History:  Diagnosis Date   Acute maxillary sinusitis 08/02/2020   Anxiety    Bilateral posterior neck pain 07/19/2020   Body mass index (BMI) 34.0-34.9, adult 07/19/2020   Cephalalgia 08/01/2020   Chronic headaches    Dermatitis 08/30/2020   Excessive or frequent menstruation 11/21/2013   Laboratory tests ordered as part of a complete physical exam (CPE) 07/19/2020   Neck pain with history of cervical spinal surgery 08/01/2020   Screening mammogram for breast cancer 07/19/2020   Slow transit constipation 07/19/2020   Weight gain 07/19/2020    Patient Active Problem List   Diagnosis Date Noted   Generalized anxiety disorder 01/03/2021   Fall down stairs 10/26/2020   Sacral back pain 10/26/2020   Encounter for weight management 08/30/2020   Body mass index (BMI)  of 33.0-33.9 in adult 08/30/2020   Encounter for medical examination to establish care 07/19/2020   Allergy to bee sting 07/19/2020   Cervical radiculopathy, chronic 07/19/2020   Stress incontinence in female 07/19/2020   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   Dysmenorrhea 11/21/2013    Past Surgical History:  Procedure Laterality Date   APPENDECTOMY     CERVICAL DISC SURGERY     DILITATION & CURRETTAGE/HYSTROSCOPY WITH THERMACHOICE ABLATION N/A 01/30/2014   Procedure: DILATATION & CURETTAGE/HYSTEROSCOPY WITH THERMACHOICE ABLATION;  Surgeon: Lazaro Arms, MD;  Location: [facility];  Service: Gynecology;  Laterality: N/A;  Total Ablation Therapy Time 9 minutes 5 seconds; 86-88 deg C   ENDOMETRIAL ABLATION     LAPAROSCOPIC BILATERAL SALPINGECTOMY Bilateral 01/30/2014   Procedure: LAPAROSCOPIC BILATERAL SALPINGECTOMY;  Surgeon: Lazaro Arms, MD;  Location: [facility];  Service: Gynecology;  Laterality: Bilateral;    OB History     Gravida  1   Para      Term      Preterm      AB      Living  1      SAB      IAB      Ectopic      Multiple      Live Births               Home Medications    Prior to Admission medications  Medication Sig Start Date End Date Taking? Authorizing Provider  cyclobenzaprine (FLEXERIL) 10 MG tablet Take 1 tablet (10 mg total) by mouth at bedtime as needed for muscle spasms. 07/19/20   [name], NP  EPINEPHrine 0.3 mg/0.3 mL IJ SOAJ injection Inject 0.3 mg into the muscle as needed for anaphylaxis. 07/19/20   [name], NP  gabapentin (NEURONTIN) 100 MG capsule Take 1 capsule (100 mg total) by mouth 3 (three) times daily as needed. 07/19/20   [name], NP  nystatin-triamcinolone (MYCOLOG II) cream Apply to the affected area 2 (two) times daily. 08/30/20   [name], NP  ondansetron (ZOFRAN-ODT) 4 MG disintegrating tablet Take 1 tablet (4 mg total) by mouth every 8 (eight) hours as needed for nausea or vomiting. 07/19/20    Early, Sung Amabile, NP  rizatriptan (MAXALT) 10 MG tablet Take 1 tablet (10 mg total) by mouth as needed for migraine. May repeat in 2 hours if needed 08/30/20   Early, Sung Amabile, NP  Semaglutide-Weight Management 1.7 MG/0.75ML SOAJ Inject 1.7 mg into the skin once a week. 11/13/20   [name], NP  Semaglutide-Weight Management 2.4 MG/0.75ML SOAJ Inject 2.4 mg into the skin once a week for 28 days. 11/12/20 03/07/21  [name], NP  sertraline (ZOLOFT) 50 MG tablet Take 1 tablet (50 mg total) by mouth at bedtime. 01/03/21   EarlySung Amabile, NP    Family History Family History  Problem Relation Age of Onset   Hypertension Mother    Mental illness Mother    Heart failure Father    Heart disease Father    Hypertension Father    Diabetes Brother    Hyperlipidemia Brother    Diabetes Paternal Grandmother    Heart disease Paternal Grandmother    Breast cancer Paternal Grandmother 15   Breast cancer Paternal Aunt 21    Social History Social History   Tobacco Use   Smoking status: Former    Packs/day: 0.25    Years: 1.00    Pack years: 0.25    Types: Cigarettes    Quit date: 01/26/1999    Years since quitting: 22.0   Smokeless tobacco: Never  Vaping Use   Vaping Use: Never used  Substance Use Topics   Alcohol use: Yes    Comment: occassional   Drug use: No     Allergies   Bee venom and Codeine   Review of Systems Review of Systems Per HPI  Physical Exam Triage Vital Signs ED Triage Vitals  Enc Vitals Group     BP 02/12/21 1412 (!) 138/95     Pulse Rate 02/12/21 1412 78     Resp 02/12/21 1412 16     Temp 02/12/21 1412 98 F (36.7 C)     Temp Source 02/12/21 1412 Oral     SpO2 02/12/21 1412 99 %     Weight --      Height --      Head Circumference --      Peak Flow --      Pain Score 02/12/21 1413 7     Pain Loc --      Pain Edu? --      Excl. in GC? --    No data found.  Updated Vital Signs BP (!) 138/95 (BP Location: Right Arm)   Pulse 78   Temp 98 F (36.7  C) (Oral)   Resp 16   SpO2 99%   Visual Acuity  Right Eye Distance:   Left Eye Distance:   Bilateral Distance:    Right Eye Near:   Left Eye Near:    Bilateral Near:     Physical Exam Constitutional:      General: She is not in acute distress.    Appearance: Normal appearance. She is not toxic-appearing or diaphoretic.  HENT:     Head: Normocephalic and atraumatic.  Eyes:     Extraocular Movements: Extraocular movements intact.     Conjunctiva/sclera: Conjunctivae normal.  Cardiovascular:     Rate and Rhythm: Normal rate and regular rhythm.     Pulses: Normal pulses.     Heart sounds: Normal heart sounds.  Pulmonary:     Effort: Pulmonary effort is normal. No respiratory distress.  Skin:    General: Skin is warm and dry.  Neurological:     General: No focal deficit present.     Mental Status: She is alert and oriented to person, place, and time. Mental status is at baseline.     Cranial Nerves: Cranial nerves are intact.     Sensory: Sensation is intact.     Motor: Motor function is intact.     Coordination: Coordination is intact.     Gait: Gait is intact.  Psychiatric:        Mood and Affect: Mood normal.        Behavior: Behavior normal.        Thought Content: Thought content normal.        Judgment: Judgment normal.     UC Treatments / Results  Labs (all labs ordered are listed, but only abnormal results are displayed) Labs Reviewed - No data to display  EKG   Radiology No results found.  Procedures Procedures (including critical care time)  Medications Ordered in UC Medications - No data to display  Initial Impression / Assessment and Plan / UC Course  I have reviewed the triage vital signs and the nursing notes.  Pertinent labs & imaging results that were available during my care of the patient were reviewed by me and considered in my medical decision making (see chart for details).     EKG completed that was fairly normal.  Physical exam  and neuro exam also normal.  Patient will need further evaluation and management at the hospital for work-up of chest pain.  Advised patient that she needs to go to the hospital via EMS.  Patient was agreeable with plan and left via EMS. Final Clinical Impressions(s) / UC Diagnoses   Final diagnoses:  Other chest pain     Discharge Instructions      Patient was sent to the hospital via EMS.     ED Prescriptions   None    PDMP not reviewed this encounter.   Lance Muss, FNP 02/12/21 1455

## 2021-03-20 NOTE — Progress Notes (Deleted)
Established Patient Office Visit  Subjective:  Patient ID: Elizabeth Franklin, female    DOB: 08-30-1980  Age: 40 y.o. MRN: [id number]  CC: No chief complaint on file.   HPI Elizabeth Franklin presents for ***  Past Medical History:  Diagnosis Date   Acute maxillary sinusitis 08/02/2020   Anxiety    Bilateral posterior neck pain 07/19/2020   Body mass index (BMI) 34.0-34.9, adult 07/19/2020   Cephalalgia 08/01/2020   Chronic headaches    Dermatitis 08/30/2020   Excessive or frequent menstruation 11/21/2013   Laboratory tests ordered as part of a complete physical exam (CPE) 07/19/2020   Neck pain with history of cervical spinal surgery 08/01/2020   Screening mammogram for breast cancer 07/19/2020   Slow transit constipation 07/19/2020   Weight gain 07/19/2020    Past Surgical History:  Procedure Laterality Date   APPENDECTOMY     CERVICAL DISC SURGERY     DILITATION & CURRETTAGE/HYSTROSCOPY WITH THERMACHOICE ABLATION N/A 01/30/2014   Procedure: DILATATION & CURETTAGE/HYSTEROSCOPY WITH THERMACHOICE ABLATION;  Surgeon: Lazaro Arms, MD;  Location: [facility];  Service: Gynecology;  Laterality: N/A;  Total Ablation Therapy Time 9 minutes 5 seconds; 86-88 deg C   ENDOMETRIAL ABLATION     LAPAROSCOPIC BILATERAL SALPINGECTOMY Bilateral 01/30/2014   Procedure: LAPAROSCOPIC BILATERAL SALPINGECTOMY;  Surgeon: Lazaro Arms, MD;  Location: [facility];  Service: Gynecology;  Laterality: Bilateral;    Family History  Problem Relation Age of Onset   Hypertension Mother    Mental illness Mother    Heart failure Father    Heart disease Father    Hypertension Father    Diabetes Brother    Hyperlipidemia Brother    Diabetes Paternal Grandmother    Heart disease Paternal Grandmother    Breast cancer Paternal Grandmother 40   Breast cancer Paternal Aunt 14    Social History   Socioeconomic History   Marital status: Significant Other    Spouse name: Susette Racer   Number of children: 1   Years of  education: Not on file   Highest education level: Not on file  Occupational History   Not on file  Tobacco Use   Smoking status: Former    Packs/day: 0.25    Years: 1.00    Pack years: 0.25    Types: Cigarettes    Quit date: 01/26/1999    Years since quitting: 22.1   Smokeless tobacco: Never  Vaping Use   Vaping Use: Never used  Substance and Sexual Activity   Alcohol use: Yes    Comment: occassional   Drug use: No   Sexual activity: Yes    Partners: Male    Birth control/protection: Surgical    Comment: monogomous relationship  Other Topics Concern   Not on file  Social History Narrative   Not on file   Social Determinants of Health   Financial Resource Strain: Not on file  Food Insecurity: Not on file  Transportation Needs: Not on file  Physical Activity: Inactive   Days of Exercise per Week: 0 days   Minutes of Exercise per Session: 0 min  Stress: Not on file  Social Connections: Not on file  Intimate Partner Violence: Not At Risk   Fear of Current or Ex-Partner: No   Emotionally Abused: No   Physically Abused: No   Sexually Abused: No    Outpatient Medications Prior to Visit  Medication Sig Dispense Refill   cyclobenzaprine (FLEXERIL) 10 MG tablet Take 1 tablet (10  mg total) by mouth at bedtime as needed for muscle spasms. 30 tablet 1   EPINEPHrine 0.3 mg/0.3 mL IJ SOAJ injection Inject 0.3 mg into the muscle as needed for anaphylaxis. 0.6 mL 1   gabapentin (NEURONTIN) 100 MG capsule Take 1 capsule (100 mg total) by mouth 3 (three) times daily as needed. 60 capsule 2   nystatin-triamcinolone (MYCOLOG II) cream Apply to the affected area 2 (two) times daily. 30 g 0   ondansetron (ZOFRAN-ODT) 4 MG disintegrating tablet Take 1 tablet (4 mg total) by mouth every 8 (eight) hours as needed for nausea or vomiting. 30 tablet 1   rizatriptan (MAXALT) 10 MG tablet Take 1 tablet (10 mg total) by mouth as needed for migraine. May repeat in 2 hours if needed 18 tablet 4    Semaglutide-Weight Management 1.7 MG/0.75ML SOAJ Inject 1.7 mg into the skin once a week. 3 mL 3   sertraline (ZOLOFT) 50 MG tablet Take 1 tablet (50 mg total) by mouth at bedtime. 30 tablet 3   No facility-administered medications prior to visit.    Allergies  Allergen Reactions   Bee Venom Swelling   Codeine Nausea And Vomiting    ROS Review of Systems    Objective:    Physical Exam  There were no vitals taken for this visit. Wt Readings from Last 3 Encounters:  01/03/21 202 lb 9.6 oz (91.9 kg)  10/26/20 211 lb 9.6 oz (96 kg)  08/30/20 220 lb (99.8 kg)     Health Maintenance Due  Topic Date Due   COVID-19 Vaccine (4 - Booster for Moderna series) 07/04/2020   INFLUENZA VACCINE  12/10/2020    There are no preventive care reminders to display for this patient.  No results found for: TSH Lab Results  Component Value Date   WBC 8.3 02/12/2021   HGB 12.9 02/12/2021   HCT 38.7 02/12/2021   MCV 96.3 02/12/2021   PLT 256 02/12/2021   Lab Results  Component Value Date   NA 138 02/12/2021   K 3.7 02/12/2021   CO2 26 02/12/2021   GLUCOSE 90 02/12/2021   BUN 14 02/12/2021   CREATININE 1.00 02/12/2021   BILITOT 0.8 07/19/2020   ALKPHOS 35 (L) 07/19/2020   AST 13 (L) 07/19/2020   ALT 13 07/19/2020   PROT 7.5 07/19/2020   ALBUMIN 4.5 07/19/2020   CALCIUM 8.7 (L) 02/12/2021   ANIONGAP 10 02/12/2021   Lab Results  Component Value Date   CHOL 177 07/19/2020   Lab Results  Component Value Date   HDL 71 07/19/2020   Lab Results  Component Value Date   LDLCALC 97 07/19/2020   Lab Results  Component Value Date   TRIG 45 07/19/2020   Lab Results  Component Value Date   CHOLHDL 2.5 07/19/2020   No results found for: HGBA1C    Assessment & Plan:   Problem List Items Addressed This Visit   None   No orders of the defined types were placed in this encounter.   Follow-up: No follow-ups on file.    [name], NP

## 2021-03-21 ENCOUNTER — Ambulatory Visit (HOSPITAL_BASED_OUTPATIENT_CLINIC_OR_DEPARTMENT_OTHER): Payer: No Typology Code available for payment source | Admitting: Nurse Practitioner

## 2021-03-25 ENCOUNTER — Ambulatory Visit (INDEPENDENT_AMBULATORY_CARE_PROVIDER_SITE_OTHER): Payer: No Typology Code available for payment source | Admitting: Nurse Practitioner

## 2021-03-25 ENCOUNTER — Encounter (HOSPITAL_BASED_OUTPATIENT_CLINIC_OR_DEPARTMENT_OTHER): Payer: Self-pay | Admitting: Nurse Practitioner

## 2021-03-25 ENCOUNTER — Other Ambulatory Visit: Payer: Self-pay

## 2021-03-25 ENCOUNTER — Other Ambulatory Visit (HOSPITAL_BASED_OUTPATIENT_CLINIC_OR_DEPARTMENT_OTHER): Payer: Self-pay

## 2021-03-25 VITALS — BP 122/82 | HR 97 | Resp 74 | Ht 68.0 in | Wt 211.0 lb

## 2021-03-25 DIAGNOSIS — F411 Generalized anxiety disorder: Secondary | ICD-10-CM

## 2021-03-25 DIAGNOSIS — R0683 Snoring: Secondary | ICD-10-CM

## 2021-03-25 DIAGNOSIS — Z7689 Persons encountering health services in other specified circumstances: Secondary | ICD-10-CM | POA: Diagnosis not present

## 2021-03-25 DIAGNOSIS — Z6833 Body mass index (BMI) 33.0-33.9, adult: Secondary | ICD-10-CM

## 2021-03-25 DIAGNOSIS — M542 Cervicalgia: Secondary | ICD-10-CM

## 2021-03-25 DIAGNOSIS — I479 Paroxysmal tachycardia, unspecified: Secondary | ICD-10-CM

## 2021-03-25 MED ORDER — CYCLOBENZAPRINE HCL 10 MG PO TABS
10.0000 mg | ORAL_TABLET | Freq: Every evening | ORAL | 1 refills | Status: DC | PRN
  Filled 2021-03-25: qty 90, 90d supply, fill #0
  Filled 2021-11-20: qty 90, 90d supply, fill #1

## 2021-03-25 MED ORDER — OZEMPIC (0.25 OR 0.5 MG/DOSE) 2 MG/1.5ML ~~LOC~~ SOPN
0.5000 mg | PEN_INJECTOR | SUBCUTANEOUS | 0 refills | Status: AC
  Filled 2021-03-25: qty 1.5, 28d supply, fill #0

## 2021-03-25 MED ORDER — SEMAGLUTIDE (1 MG/DOSE) 4 MG/3ML ~~LOC~~ SOPN
1.0000 mg | PEN_INJECTOR | SUBCUTANEOUS | 3 refills | Status: DC
Start: 2021-03-25 — End: 2021-08-09
  Filled 2021-03-25: qty 6, 56d supply, fill #0
  Filled 2021-04-18: qty 3, 28d supply, fill #0
  Filled 2021-05-20: qty 9, 84d supply, fill #1

## 2021-03-25 NOTE — Assessment & Plan Note (Signed)
Restart semaglutide Referral for sleep study with neuro

## 2021-03-25 NOTE — Assessment & Plan Note (Signed)
Doing much better now that her wedding is behind her and the stress of that has been lifted.  She has stopped sertraline due to side effect concerns and feels fine without medication at this time.  Recommend she let me know if she begins to have new or worsening symptoms of anxiety and we can discuss treatment options.  No alarm signs present today.

## 2021-03-25 NOTE — Patient Instructions (Signed)
We will restart the semaglutide today at the 0.5mg  dose. We can try this for 2 weeks then increase to the 0.5mg .   I have sent in the referral for a sleep study. They will contact you to set this up. If you dont hear anything in the next week, please let me know.   I have sent in the flexeril for you. I recommend taking it nightly for about a week to see if you can relieve some of the spasms in your neck.

## 2021-03-25 NOTE — Assessment & Plan Note (Addendum)
Episodes of tachycardia during sleep captured with watch.  Not occurring during the day.  Positive for snoring and apneic episodes while sleeping per husband. Day time sleepiness present.  Symptoms consistent with OSA.  She has gained weight back recently, which could be contributing.  Will plan to restart semaglutide to help with weight management, as well.

## 2021-03-25 NOTE — Assessment & Plan Note (Signed)
Previous success with YQIHKV for weight loss with about 35 lb weight loss total. She stopped taking this about 4-6 weeks ago due to stressors and other symptoms she was experiencing prior to her wedding.  She has since gained back about 15-20 pounds and would like to restart on something.  Wegovy unavailable in starter dose at this time, however, she may be eligible for Ozempic due to BMI and suspected sleep apnea.  Will send prescription in.

## 2021-03-25 NOTE — Assessment & Plan Note (Signed)
Cervical pain consistent with spasms noted more when stress levels are increased. Recommend stretching and at home exercises, ice, and heat to help with tension and spasms.  Recommend flexeril for 5-7 nights in a row with above activities to see if the cycle of spasms will break to provide relief.  Imaging and work-up have been completed extensively in the past with no new or changing symptoms.  Patient will notify if her symptoms worsen or fail to improve.

## 2021-03-25 NOTE — Progress Notes (Signed)
Established Patient Office Visit  Subjective:  Patient ID: Elizabeth Franklin, female    DOB: 1980/11/14  Age: 40 y.o. MRN: [id number]  CC:  Chief Complaint  Patient presents with   Follow-up    F/U over all doing well. She did have chest pain seen in ED 10/4. Would like test results. Called several times no call back. She would like to start back on [facility] shots.     HPI Elizabeth Franklin presents for follow-up for anxiety.  At her last visit, Elizabeth Franklin was feeling overwhelmed and anxious. She was preparing for her wedding in October, which was adding to her stressors. She was started on sertraline 50mg  daily. Since that time she tells me her anxiety has improved significantly. She reports that the two weeks leading up to the wedding her anxiety exacerbated significantly and she finally stopped the sertraline because she felt it may have been contributing to her symptoms. Since the wedding, she reports her symptoms have improve dramatically. She does not wish to restart medication at this time.  She reports that she also stopped Wegovy about 2-4 weeks prior to the wedding due to her worsening anxiety symptoms. She was on the 2.4mg  dose of this at the time. She tells me that she has gained back about 15 lbs and she would like to restart this, if possible.   She reports that her husband has told her she snores at night and he has witnessed several apneic episodes while she is sleeping. She also reports over 135 notifications for tachycardia during the night on less than 7 days worth of information. Her HR alerts have been between 120-184 BPM. She endorses day time fatigue and mental fog. She has also been struggling with her weight.   She has been experiencing more headaches and cervicogenic tenderness/spasms with the increased stress over the past month. She intermittently takes flexeril for this and it helps significantly. She would like a refill.    Past Medical History:  Diagnosis Date    Acute maxillary sinusitis 08/02/2020   Anxiety    Bilateral posterior neck pain 07/19/2020   Body mass index (BMI) 34.0-34.9, adult 07/19/2020   Cephalalgia 08/01/2020   Chronic headaches    Dermatitis 08/30/2020   Excessive or frequent menstruation 11/21/2013   Fall down stairs 10/26/2020   Laboratory tests ordered as part of a complete physical exam (CPE) 07/19/2020   Neck pain with history of cervical spinal surgery 08/01/2020   Screening mammogram for breast cancer 07/19/2020   Slow transit constipation 07/19/2020   Weight gain 07/19/2020    Past Surgical History:  Procedure Laterality Date   APPENDECTOMY     CERVICAL DISC SURGERY     DILITATION & CURRETTAGE/HYSTROSCOPY WITH THERMACHOICE ABLATION N/A 01/30/2014   Procedure: DILATATION & CURETTAGE/HYSTEROSCOPY WITH THERMACHOICE ABLATION;  Surgeon: 02/01/2014, MD;  Location: [facility];  Service: Gynecology;  Laterality: N/A;  Total Ablation Therapy Time 9 minutes 5 seconds; 86-88 deg C   ENDOMETRIAL ABLATION     LAPAROSCOPIC BILATERAL SALPINGECTOMY Bilateral 01/30/2014   Procedure: LAPAROSCOPIC BILATERAL SALPINGECTOMY;  Surgeon: 02/01/2014, MD;  Location: [facility];  Service: Gynecology;  Laterality: Bilateral;    Family History  Problem Relation Age of Onset   Hypertension Mother    Mental illness Mother    Heart failure Father    Heart disease Father    Hypertension Father    Diabetes Brother    Hyperlipidemia Brother    Diabetes Paternal Grandmother  Heart disease Paternal Grandmother    Breast cancer Paternal Grandmother 37   Breast cancer Paternal Aunt 25    Social History   Socioeconomic History   Marital status: Significant Other    Spouse name: Susette Racer   Number of children: 1   Years of education: Not on file   Highest education level: Not on file  Occupational History   Not on file  Tobacco Use   Smoking status: Former    Packs/day: 0.25    Years: 1.00    Pack years: 0.25    Types: Cigarettes    Quit  date: 01/26/1999    Years since quitting: 22.1   Smokeless tobacco: Never  Vaping Use   Vaping Use: Never used  Substance and Sexual Activity   Alcohol use: Yes    Comment: occassional   Drug use: No   Sexual activity: Yes    Partners: Male    Birth control/protection: Surgical    Comment: monogomous relationship  Other Topics Concern   Not on file  Social History Narrative   Not on file   Social Determinants of Health   Financial Resource Strain: Not on file  Food Insecurity: Not on file  Transportation Needs: Not on file  Physical Activity: Inactive   Days of Exercise per Week: 0 days   Minutes of Exercise per Session: 0 min  Stress: Not on file  Social Connections: Not on file  Intimate Partner Violence: Not At Risk   Fear of Current or Ex-Partner: No   Emotionally Abused: No   Physically Abused: No   Sexually Abused: No    Outpatient Medications Prior to Visit  Medication Sig Dispense Refill   EPINEPHrine 0.3 mg/0.3 mL IJ SOAJ injection Inject 0.3 mg into the muscle as needed for anaphylaxis. 0.6 mL 1   gabapentin (NEURONTIN) 100 MG capsule Take 1 capsule (100 mg total) by mouth 3 (three) times daily as needed. 60 capsule 2   nystatin-triamcinolone (MYCOLOG II) cream Apply to the affected area 2 (two) times daily. 30 g 0   ondansetron (ZOFRAN-ODT) 4 MG disintegrating tablet Take 1 tablet (4 mg total) by mouth every 8 (eight) hours as needed for nausea or vomiting. 30 tablet 1   rizatriptan (MAXALT) 10 MG tablet Take 1 tablet (10 mg total) by mouth as needed for migraine. May repeat in 2 hours if needed 18 tablet 4   sertraline (ZOLOFT) 50 MG tablet Take 1 tablet (50 mg total) by mouth at bedtime. 30 tablet 3   cyclobenzaprine (FLEXERIL) 10 MG tablet Take 1 tablet (10 mg total) by mouth at bedtime as needed for muscle spasms. 30 tablet 1   Semaglutide-Weight Management 1.7 MG/0.75ML SOAJ Inject 1.7 mg into the skin once a week. 3 mL 3   No facility-administered  medications prior to visit.    Allergies  Allergen Reactions   Bee Venom Swelling   Codeine Nausea And Vomiting    ROS Review of Systems All review of systems negative except what is listed in the HPI    Objective:    Physical Exam Vitals and nursing note reviewed.  Constitutional:      Appearance: Normal appearance.  HENT:     Head: Normocephalic.  Eyes:     Extraocular Movements: Extraocular movements intact.     Conjunctiva/sclera: Conjunctivae normal.     Pupils: Pupils are equal, round, and reactive to light.  Neck:     Vascular: No carotid bruit.  Cardiovascular:  Rate and Rhythm: Normal rate and regular rhythm.     Pulses: Normal pulses.     Heart sounds: Normal heart sounds.  Pulmonary:     Effort: Pulmonary effort is normal.     Breath sounds: Normal breath sounds.  Musculoskeletal:     Cervical back: Normal range of motion. Rigidity and tenderness present.     Right lower leg: No edema.     Left lower leg: No edema.  Skin:    General: Skin is warm and dry.     Capillary Refill: Capillary refill takes less than 2 seconds.  Neurological:     General: No focal deficit present.     Mental Status: She is alert and oriented to person, place, and time.  Psychiatric:        Mood and Affect: Mood normal.        Behavior: Behavior normal.        Thought Content: Thought content normal.        Judgment: Judgment normal.    BP 122/82   Pulse 97   Resp (!) 74   Ht 5\' 8"  (1.727 m)   Wt 211 lb (95.7 kg)   BMI 32.08 kg/m  Wt Readings from Last 3 Encounters:  03/25/21 211 lb (95.7 kg)  01/03/21 202 lb 9.6 oz (91.9 kg)  10/26/20 211 lb 9.6 oz (96 kg)     Health Maintenance Due  Topic Date Due   COVID-19 Vaccine (4 - Booster for Moderna series) 07/04/2020    There are no preventive care reminders to display for this patient.  No results found for: TSH Lab Results  Component Value Date   WBC 8.3 02/12/2021   HGB 12.9 02/12/2021   HCT 38.7  02/12/2021   MCV 96.3 02/12/2021   PLT 256 02/12/2021   Lab Results  Component Value Date   NA 138 02/12/2021   K 3.7 02/12/2021   CO2 26 02/12/2021   GLUCOSE 90 02/12/2021   BUN 14 02/12/2021   CREATININE 1.00 02/12/2021   BILITOT 0.8 07/19/2020   ALKPHOS 35 (L) 07/19/2020   AST 13 (L) 07/19/2020   ALT 13 07/19/2020   PROT 7.5 07/19/2020   ALBUMIN 4.5 07/19/2020   CALCIUM 8.7 (L) 02/12/2021   ANIONGAP 10 02/12/2021   Lab Results  Component Value Date   CHOL 177 07/19/2020   Lab Results  Component Value Date   HDL 71 07/19/2020   Lab Results  Component Value Date   LDLCALC 97 07/19/2020   Lab Results  Component Value Date   TRIG 45 07/19/2020   Lab Results  Component Value Date   CHOLHDL 2.5 07/19/2020   No results found for: HGBA1C    Assessment & Plan:   Problem List Items Addressed This Visit     Bilateral posterior neck pain    Cervical pain consistent with spasms noted more when stress levels are increased. Recommend stretching and at home exercises, ice, and heat to help with tension and spasms.  Recommend flexeril for 5-7 nights in a row with above activities to see if the cycle of spasms will break to provide relief.  Imaging and work-up have been completed extensively in the past with no new or changing symptoms.  Patient will notify if her symptoms worsen or fail to improve.       Relevant Medications   cyclobenzaprine (FLEXERIL) 10 MG tablet   Encounter for weight management    Previous success with ZOXWRU for weight loss  with about 35 lb weight loss total. She stopped taking this about 4-6 weeks ago due to stressors and other symptoms she was experiencing prior to her wedding.  She has since gained back about 15-20 pounds and would like to restart on something.  Wegovy unavailable in starter dose at this time, however, she may be eligible for Ozempic due to BMI and suspected sleep apnea.  Will send prescription in.       Body mass index  (BMI) of 33.0-33.9 in adult    Restart semaglutide Referral for sleep study with neuro      Relevant Medications   Semaglutide,0.25 or 0.5MG /DOS, (OZEMPIC, 0.25 OR 0.5 MG/DOSE,) 2 MG/1.5ML SOPN   Semaglutide, 1 MG/DOSE, 4 MG/3ML SOPN   Other Relevant Orders   Ambulatory referral to Neurology   Generalized anxiety disorder - Primary    Doing much better now that her wedding is behind her and the stress of that has been lifted.  She has stopped sertraline due to side effect concerns and feels fine without medication at this time.  Recommend she let me know if she begins to have new or worsening symptoms of anxiety and we can discuss treatment options.  No alarm signs present today.       Snoring    Night time snoring with apneic episodes and paroxysmal tachycardia consistent with OSA symptoms.  Referral for sleep study with neuro placed.       Relevant Medications   Semaglutide,0.25 or 0.5MG /DOS, (OZEMPIC, 0.25 OR 0.5 MG/DOSE,) 2 MG/1.5ML SOPN   Semaglutide, 1 MG/DOSE, 4 MG/3ML SOPN   Other Relevant Orders   Ambulatory referral to Neurology   Tachycardia, paroxysmal (HCC)    Episodes of tachycardia during sleep captured with watch.  Not occurring during the day.  Positive for snoring and apneic episodes while sleeping per husband. Day time sleepiness present.  Symptoms consistent with OSA.  She has gained weight back recently, which could be contributing.  Will plan to restart semaglutide to help with weight management, as well.       Relevant Orders   Ambulatory referral to Neurology    Meds ordered this encounter  Medications   Semaglutide,0.25 or 0.5MG /DOS, (OZEMPIC, 0.25 OR 0.5 MG/DOSE,) 2 MG/1.5ML SOPN    Sig: Inject 0.5 mg into the skin once a week for 28 days.    Dispense:  1.5 mL    Refill:  0   Semaglutide, 1 MG/DOSE, 4 MG/3ML SOPN    Sig: Inject 1 mg as directed once a week.    Dispense:  6 mL    Refill:  3   cyclobenzaprine (FLEXERIL) 10 MG tablet    Sig:  Take 1 tablet (10 mg total) by mouth at bedtime as needed for muscle spasms.    Dispense:  90 tablet    Refill:  1    Follow-up: Return in about 3 months (around 06/25/2021) for VV weight.    [name], NP

## 2021-03-25 NOTE — Assessment & Plan Note (Signed)
Night time snoring with apneic episodes and paroxysmal tachycardia consistent with OSA symptoms.  Referral for sleep study with neuro placed.

## 2021-04-18 ENCOUNTER — Other Ambulatory Visit (HOSPITAL_BASED_OUTPATIENT_CLINIC_OR_DEPARTMENT_OTHER): Payer: Self-pay

## 2021-04-22 ENCOUNTER — Other Ambulatory Visit (HOSPITAL_BASED_OUTPATIENT_CLINIC_OR_DEPARTMENT_OTHER): Payer: Self-pay

## 2021-04-29 ENCOUNTER — Ambulatory Visit (HOSPITAL_BASED_OUTPATIENT_CLINIC_OR_DEPARTMENT_OTHER): Payer: No Typology Code available for payment source | Admitting: Nurse Practitioner

## 2021-05-16 ENCOUNTER — Encounter (HOSPITAL_BASED_OUTPATIENT_CLINIC_OR_DEPARTMENT_OTHER): Payer: Self-pay | Admitting: Nurse Practitioner

## 2021-05-20 ENCOUNTER — Other Ambulatory Visit (HOSPITAL_BASED_OUTPATIENT_CLINIC_OR_DEPARTMENT_OTHER): Payer: Self-pay

## 2021-05-24 ENCOUNTER — Ambulatory Visit (HOSPITAL_BASED_OUTPATIENT_CLINIC_OR_DEPARTMENT_OTHER): Payer: No Typology Code available for payment source | Admitting: Nurse Practitioner

## 2021-06-06 ENCOUNTER — Encounter (HOSPITAL_BASED_OUTPATIENT_CLINIC_OR_DEPARTMENT_OTHER): Payer: Self-pay

## 2021-06-06 ENCOUNTER — Other Ambulatory Visit (HOSPITAL_BASED_OUTPATIENT_CLINIC_OR_DEPARTMENT_OTHER): Payer: Self-pay

## 2021-06-06 ENCOUNTER — Institutional Professional Consult (permissible substitution): Payer: No Typology Code available for payment source | Admitting: Neurology

## 2021-06-06 MED ORDER — AMOXICILLIN 250 MG PO CAPS
ORAL_CAPSULE | ORAL | 0 refills | Status: DC
  Filled 2021-06-06: qty 15, 5d supply, fill #0

## 2021-06-07 ENCOUNTER — Other Ambulatory Visit (HOSPITAL_BASED_OUTPATIENT_CLINIC_OR_DEPARTMENT_OTHER): Payer: Self-pay

## 2021-06-18 ENCOUNTER — Other Ambulatory Visit (HOSPITAL_BASED_OUTPATIENT_CLINIC_OR_DEPARTMENT_OTHER): Payer: Self-pay

## 2021-06-19 ENCOUNTER — Other Ambulatory Visit (HOSPITAL_BASED_OUTPATIENT_CLINIC_OR_DEPARTMENT_OTHER): Payer: Self-pay

## 2021-06-25 ENCOUNTER — Telehealth (HOSPITAL_BASED_OUTPATIENT_CLINIC_OR_DEPARTMENT_OTHER): Payer: No Typology Code available for payment source | Admitting: Nurse Practitioner

## 2021-07-02 ENCOUNTER — Other Ambulatory Visit (HOSPITAL_BASED_OUTPATIENT_CLINIC_OR_DEPARTMENT_OTHER): Payer: Self-pay

## 2021-07-02 MED ORDER — COVID-19 AT HOME ANTIGEN TEST VI KIT
PACK | 0 refills | Status: DC
  Filled 2021-07-02: qty 2, 4d supply, fill #0

## 2021-07-03 ENCOUNTER — Encounter (HOSPITAL_BASED_OUTPATIENT_CLINIC_OR_DEPARTMENT_OTHER): Payer: Self-pay

## 2021-07-03 ENCOUNTER — Other Ambulatory Visit: Payer: Self-pay

## 2021-07-03 ENCOUNTER — Other Ambulatory Visit (HOSPITAL_BASED_OUTPATIENT_CLINIC_OR_DEPARTMENT_OTHER): Payer: Self-pay

## 2021-07-03 ENCOUNTER — Telehealth (INDEPENDENT_AMBULATORY_CARE_PROVIDER_SITE_OTHER): Payer: No Typology Code available for payment source | Admitting: Nurse Practitioner

## 2021-07-03 DIAGNOSIS — M5412 Radiculopathy, cervical region: Secondary | ICD-10-CM

## 2021-07-03 DIAGNOSIS — G4486 Cervicogenic headache: Secondary | ICD-10-CM | POA: Diagnosis not present

## 2021-07-03 DIAGNOSIS — L308 Other specified dermatitis: Secondary | ICD-10-CM

## 2021-07-03 DIAGNOSIS — R0683 Snoring: Secondary | ICD-10-CM

## 2021-07-03 DIAGNOSIS — Z6833 Body mass index (BMI) 33.0-33.9, adult: Secondary | ICD-10-CM

## 2021-07-03 DIAGNOSIS — N62 Hypertrophy of breast: Secondary | ICD-10-CM | POA: Diagnosis not present

## 2021-07-03 DIAGNOSIS — Z7689 Persons encountering health services in other specified circumstances: Secondary | ICD-10-CM

## 2021-07-03 MED ORDER — BETAMETHASONE DIPROPIONATE 0.05 % EX OINT
TOPICAL_OINTMENT | Freq: Two times a day (BID) | CUTANEOUS | 3 refills | Status: DC
  Filled 2021-07-03: qty 45, 14d supply, fill #0

## 2021-07-03 MED ORDER — PHENTERMINE HCL 37.5 MG PO CAPS
ORAL_CAPSULE | ORAL | 0 refills | Status: DC
  Filled 2021-07-03: qty 30, fill #0

## 2021-07-03 MED ORDER — PHENTERMINE HCL 37.5 MG PO CAPS
ORAL_CAPSULE | ORAL | 0 refills | Status: DC
  Filled 2021-07-03: qty 30, 30d supply, fill #0

## 2021-07-03 NOTE — Progress Notes (Signed)
Virtual Visit Encounter mychart visit.   I connected with  Kortney A Nance Wimbush on 07/04/21 at 10:10 AM EST by secure audio and/or video enabled telemedicine application. I verified that I am speaking with the correct person using two identifiers.   I introduced myself as a Publishing rights manager with the practice. The limitations of evaluation and management by telemedicine discussed with the patient and the availability of in person appointments. The patient expressed verbal understanding and consent to proceed.  Participating parties in this visit include: Myself and patient  The patient is: Patient Location: Home I am: Provider Location: Office/Clinic Subjective:    CC and HPI: Elizabeth Franklin is a 41 y.o. year old female presenting for follow up of weight management.  Mimie tells me she stopped Ozempic about 2.5 weeks ago. She was gaining weight while still on the medication. She said her appetite was increased despite efforts to mitigate this. She endorses frustration over her weight and the failure of the medication to help her achieve her goals. She is working on diet and increased activity. She would like to try phentermine to see if this would be helpful for her.   She also endorses concerns with recent exacerbation of eczema on eyes and elbows. She has had this in the past and used steroid cream which helped resolve the symptoms. She denies drainage or infection in the areas.   Grover does have a history of snoring. She tells me she has a sleep study scheduled in March.   Tiarra also has a significant history of headaches which she has always attributed to her neck and shoulder pain.  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  He suggested a possible nightguard could be effective for the headaches, TMJ, and possibly sleep apnea.  She is  looking into who may provide this nightguard and have it covered with insurance.  Saory also expresses concern over her neck and shoulder pain and reports that she feels these symptoms could be exacerbated by the size of her breasts.  She tells me she recently went on vacation and primarily wore strap less bras and noted a significant difference in her neck and shoulder pain during this time.  She feels that the additional weight of her breasts on her shoulders are contributing to the neck and back pain as well as headaches and would like a referral for plastic surgery today for evaluation for possible surgical intervention to help lift her breasts.  Past medical history, Surgical history, Family history not pertinant except as noted below, Social history, Allergies, and medications have been entered into the medical record, reviewed, and corrections made.   Review of Systems:  All review of systems negative except what is listed in the HPI  Objective:    Alert and oriented x 4 Speaking in clear sentences with no shortness of breath. No distress.  Impression and Recommendations:    Problem List Items Addressed This Visit     Cervical radiculopathy, chronic    Chronic. Potential exacerbation of spasming and pain related to macromastia and additional weight on the neck and shoulders from supportive undergarments. We will send referral to plastic surgery for evaluation and recommendations on treatment options that may be helpful.      Relevant Medications   phentermine 37.5 MG capsule   phentermine 37.5 MG capsule (Start on 07/31/2021)  phentermine 37.5 MG capsule (Start on 08/28/2021)   Other Relevant Orders   Ambulatory referral to Plastic Surgery   Encounter for weight management    Unsuccessful weight loss with use of Ozempic and Wegovy despite maximization of dosages and monitoring diet and exercise. Suspect patient has developed a tolerance of medication with time and is possibly  a rapid metabolizer resulting in less effective treatment.  Will plan to begin treatment with phentermine to see if this is helpful for her to reach her weight loss goal.  Recommend monitoring for increased anxiety symptoms and interference with sleep.  Plan to follow-up in 3 months with [facility] message to let me know how she is doing and repeat check in 6 months or sooner if needed.       Eczema    Will send treatment with betamethasone for affected areas.  Ensure that you are only using very thin layers around the eyes and avoid using for more than 7 days to prevent thinning and discoloration of the skin.       Relevant Medications   betamethasone dipropionate (DIPROLENE) 0.05 % ointment   Snoring    Recommend evaluation with sleep study for further recommendations on management that may be helpful. 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  Will send referral to plastic surgery for evaluation and recommendations on treatment options that may be beneficial to help reduce patients symptoms.       Relevant Orders   Ambulatory referral to Plastic Surgery   Cervicogenic headache   Relevant Orders   Ambulatory referral to Plastic Surgery   Body mass index (BMI) of 33.0-33.9 in adult - Primary   Relevant Medications   phentermine 37.5 MG capsule   phentermine 37.5 MG capsule (Start on 07/31/2021)   phentermine 37.5 MG capsule (Start on 08/28/2021)    orders and follow up as documented in EMR I discussed the assessment and treatment plan with the patient. The patient was provided an opportunity to ask questions and all were answered. The patient agreed with the plan and demonstrated an understanding of the instructions.   The patient was advised to call back or seek an in-person evaluation if  the symptoms worsen or if the condition fails to improve as anticipated.  Follow-Up: in 6 months  I provided 31 minutes of non-face-to-face interaction with this non face-to-face encounter including intake, same-day documentation, and chart review.   [name], NP , DNP, AGNP-c [facility] at [facility] [phone] (fax)

## 2021-07-04 ENCOUNTER — Encounter (HOSPITAL_BASED_OUTPATIENT_CLINIC_OR_DEPARTMENT_OTHER): Payer: Self-pay | Admitting: Nurse Practitioner

## 2021-07-04 ENCOUNTER — Other Ambulatory Visit (HOSPITAL_BASED_OUTPATIENT_CLINIC_OR_DEPARTMENT_OTHER): Payer: Self-pay

## 2021-07-04 NOTE — Assessment & Plan Note (Signed)
Unsuccessful weight loss with use of Ozempic and Wegovy despite maximization of dosages and monitoring diet and exercise. Suspect patient has developed a tolerance of medication with time and is possibly a rapid metabolizer resulting in less effective treatment.  Will plan to begin treatment with phentermine to see if this is helpful for her to reach her weight loss goal.  Recommend monitoring for increased anxiety symptoms and interference with sleep.  Plan to follow-up in 3 months with [facility] message to let me know how she is doing and repeat check in 6 months or sooner if needed.

## 2021-07-04 NOTE — Assessment & Plan Note (Signed)
Recommend evaluation with sleep study for further recommendations on management that may be helpful. 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

## 2021-07-04 NOTE — Assessment & Plan Note (Signed)
Chronic. Potential exacerbation of spasming and pain related to macromastia and additional weight on the neck and shoulders from supportive undergarments. We will send referral to plastic surgery for evaluation and recommendations on treatment options that may be helpful.

## 2021-07-04 NOTE — Assessment & Plan Note (Signed)
Will send treatment with betamethasone for affected areas.  Ensure that you are only using very thin layers around the eyes and avoid using for more than 7 days to prevent thinning and discoloration of the skin.

## 2021-07-04 NOTE — Assessment & Plan Note (Signed)
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  Will send referral to plastic surgery for evaluation and recommendations on treatment options that may be beneficial to help reduce patients symptoms.

## 2021-08-06 ENCOUNTER — Encounter: Payer: Self-pay | Admitting: Neurology

## 2021-08-06 ENCOUNTER — Institutional Professional Consult (permissible substitution): Payer: No Typology Code available for payment source | Admitting: Neurology

## 2021-08-09 ENCOUNTER — Encounter: Payer: Self-pay | Admitting: Plastic Surgery

## 2021-08-09 ENCOUNTER — Ambulatory Visit (INDEPENDENT_AMBULATORY_CARE_PROVIDER_SITE_OTHER): Payer: No Typology Code available for payment source | Admitting: Plastic Surgery

## 2021-08-09 VITALS — BP 149/88 | HR 74 | Ht 69.0 in | Wt 216.8 lb

## 2021-08-09 DIAGNOSIS — N62 Hypertrophy of breast: Secondary | ICD-10-CM | POA: Diagnosis not present

## 2021-08-09 DIAGNOSIS — M542 Cervicalgia: Secondary | ICD-10-CM

## 2021-08-09 DIAGNOSIS — F411 Generalized anxiety disorder: Secondary | ICD-10-CM | POA: Diagnosis not present

## 2021-08-09 DIAGNOSIS — M533 Sacrococcygeal disorders, not elsewhere classified: Secondary | ICD-10-CM

## 2021-08-09 NOTE — Progress Notes (Signed)
? ?  Patient ID: Elizabeth Franklin, female    DOB: 07-03-1980, 41 y.o.   MRN: [id number] ? ? ?Chief Complaint  ?Patient presents with  ? Advice Only  ? ? ?Mammary Hyperplasia: ?The patient is a 41 y.o. female with a history of mammary hyperplasia for several years.  She has extremely large breasts causing symptoms that include the following: ?Back pain in the upper and lower back, including neck pain. She pulls or pins her bra straps to provide better lift and relief of the pressure and pain. She notices relief by holding her breast up manually.  Her shoulder straps cause grooves and pain and pressure that requires padding for relief. Pain medication is sometimes required with motrin and tylenol.  Activities that are hindered by enlarged breasts include: exercise and running.  She has tried supportive clothing as well as fitted bras without improvement. ? ?Her breasts are extremely large and fairly symmetric.  She has hyperpigmentation of the inframammary area on both sides.  The sternal to nipple distance on the right is 31 cm and the left is 31 cm.  The IMF distance is 12 cm.  She is 5 feet 9 inches tall and weighs 155 pounds.  The BMI = 22.9 kg/m?Marland Kitchen  Preoperative bra size = E cup.  The estimated excess breast tissue to be removed at the time of surgery = 450 grams on the left and 450 grams on the right.  Mammogram history: 8/22 negative.  Family history of breast cancer:  paternal grandmother.  Tobacco use:  quite 20 years ago.   The patient expresses the desire to pursue surgical intervention. ? ? ? ?Review of Systems  ?Constitutional: Negative.   ?HENT: Negative.    ?Eyes: Negative.   ?Respiratory: Negative.    ?Cardiovascular: Negative.  Negative for leg swelling.  ?Gastrointestinal: Negative.  Negative for abdominal distention.  ?Endocrine: Negative.   ?Genitourinary: Negative.   ?Musculoskeletal:  Positive for back pain and neck pain.  ?Skin:  Positive for rash.  ?Hematological: Negative.    ?Psychiatric/Behavioral: Negative.    ? ?Past Medical History:  ?Diagnosis Date  ? Acute maxillary sinusitis 08/02/2020  ? Anxiety   ? Bilateral posterior neck pain 07/19/2020  ? Body mass index (BMI) 34.0-34.9, adult 07/19/2020  ? Cephalalgia 08/01/2020  ? Chronic headaches   ? Dermatitis 08/30/2020  ? Encounter for medical examination to establish care 07/19/2020  ? Excessive or frequent menstruation 11/21/2013  ? Fall down stairs 10/26/2020  ? Laboratory tests ordered as part of a complete physical exam (CPE) 07/19/2020  ? Neck pain with history of cervical spinal surgery 08/01/2020  ? Screening mammogram for breast cancer 07/19/2020  ? Slow transit constipation 07/19/2020  ? Weight gain 07/19/2020  ?  ?Past Surgical History:  ?Procedure Laterality Date  ? APPENDECTOMY    ? CERVICAL DISC SURGERY    ? DILITATION & CURRETTAGE/HYSTROSCOPY WITH THERMACHOICE ABLATION N/A 01/30/2014  ? Procedure: DILATATION & CURETTAGE/HYSTEROSCOPY WITH THERMACHOICE ABLATION;  Surgeon: Florian Buff, MD;  Location: [facility];  Service: Gynecology;  Laterality: N/A;  Total Ablation Therapy Time 9 minutes 5 seconds; 86-88 deg C  ? ENDOMETRIAL ABLATION    ? LAPAROSCOPIC BILATERAL SALPINGECTOMY Bilateral 01/30/2014  ? Procedure: LAPAROSCOPIC BILATERAL SALPINGECTOMY;  Surgeon: Florian Buff, MD;  Location: [facility];  Service: Gynecology;  Laterality: Bilateral;  ?  ? ? ?Current Outpatient Medications:  ?  amoxicillin (AMOXIL) 250 MG capsule, Take one capsule TID until finished., Disp: 15 capsule, Rfl:  0 ?  betamethasone dipropionate (DIPROLENE) 0.05 % ointment, Apply topically 2 (two) times daily. To eczema rash., Disp: 45 g, Rfl: 3 ?  cyclobenzaprine (FLEXERIL) 10 MG tablet, Take 1 tablet (10 mg total) by mouth at bedtime as needed for muscle spasms., Disp: 90 tablet, Rfl: 1 ?  EPINEPHrine 0.3 mg/0.3 mL IJ SOAJ injection, Inject 0.3 mg into the muscle as needed for anaphylaxis., Disp: 0.6 mL, Rfl: 1 ?  gabapentin (NEURONTIN) 100 MG capsule, Take 1 capsule  (100 mg total) by mouth 3 (three) times daily as needed., Disp: 60 capsule, Rfl: 2 ?  nystatin-triamcinolone (MYCOLOG II) cream, Apply to the affected area 2 (two) times daily., Disp: 30 g, Rfl: 0 ?  ondansetron (ZOFRAN-ODT) 4 MG disintegrating tablet, Take 1 tablet (4 mg total) by mouth every 8 (eight) hours as needed for nausea or vomiting., Disp: 30 tablet, Rfl: 1 ?  phentermine 37.5 MG capsule, Take 1 capsule by mouth every morning, Disp: 30 capsule, Rfl: 0 ?  phentermine 37.5 MG capsule, One capsule by mouth qAM, Disp: 30 capsule, Rfl: 0 ?  [START ON 08/28/2021] phentermine 37.5 MG capsule, One capsule by mouth qAM, Disp: 30 capsule, Rfl: 0 ?  rizatriptan (MAXALT) 10 MG tablet, Take 1 tablet (10 mg total) by mouth as needed for migraine. May repeat in 2 hours if needed, Disp: 18 tablet, Rfl: 4 ?  Semaglutide, 1 MG/DOSE, 4 MG/3ML SOPN, Inject 1 mg as directed once a week., Disp: 6 mL, Rfl: 3 ?  COVID-19 At Home Antigen Test KIT, Use as directed (Patient not taking: Reported on 08/09/2021), Disp: 2 kit, Rfl: 0  ? ?Objective:  ? ?There were no vitals filed for this visit. ? ?Physical Exam ?Vitals and nursing note reviewed.  ?Constitutional:   ?   Appearance: Normal appearance.  ?HENT:  ?   Head: Normocephalic and atraumatic.  ?Cardiovascular:  ?   Rate and Rhythm: Normal rate.  ?   Pulses: Normal pulses.  ?Pulmonary:  ?   Effort: Pulmonary effort is normal. No respiratory distress.  ?Abdominal:  ?   General: There is no distension.  ?   Palpations: Abdomen is soft.  ?   Tenderness: There is no abdominal tenderness.  ?Skin: ?   General: Skin is warm.  ?   Capillary Refill: Capillary refill takes less than 2 seconds.  ?   Coloration: Skin is not jaundiced.  ?   Findings: No bruising.  ?Neurological:  ?   Mental Status: She is alert and oriented to person, place, and time.  ?Psychiatric:     ?   Mood and Affect: Mood normal.     ?   Behavior: Behavior normal.  ? ? ?Assessment & Plan:  ?Sacral back pain ? ?Large  breasts ? ?Generalized anxiety disorder ? ?Bilateral posterior neck pain ? ?The procedure the patient selected and that was best for the patient was discussed. The risk were discussed and include but not limited to the following:  Breast asymmetry, fluid accumulation, firmness of the breast, inability to breast feed, loss of nipple or areola, skin loss, change in skin and nipple sensation, fat necrosis of the breast tissue, bleeding, infection and healing delay.  There are risks of anesthesia and injury to nerves or blood vessels.  Allergic reaction to tape, suture and skin glue are possible.  There will be swelling.  Any of these can lead to the need for revisional surgery.  A breast reduction has potential to interfere with diagnostic procedures  in the future.  This procedure is best done when the breast is fully developed.  Changes in the breast will continue to occur over time: pregnancy, weight gain or weigh loss. ?   ?Total time: 45 minutes. This includes time spent with the patient during the visit as well as time spent before and after the visit reviewing the chart, documenting the encounter, ordering pertinent studies and literature for the patient.   ?Physical therapy:  ordered ?Mammogram:  done ? ? ?Pictures were obtained of the patient and placed in the chart with the patient's or guardian's permission. ? ?Follow up in 6 weeks.  Good candidate for bilateral breast reduction with possible liposuction. ? ?Loel Lofty Courteney Alderete, DO ?

## 2021-08-13 ENCOUNTER — Encounter (HOSPITAL_BASED_OUTPATIENT_CLINIC_OR_DEPARTMENT_OTHER): Payer: No Typology Code available for payment source | Admitting: Nurse Practitioner

## 2021-08-14 ENCOUNTER — Encounter (HOSPITAL_BASED_OUTPATIENT_CLINIC_OR_DEPARTMENT_OTHER): Payer: Self-pay | Admitting: Nurse Practitioner

## 2021-08-14 ENCOUNTER — Other Ambulatory Visit (HOSPITAL_BASED_OUTPATIENT_CLINIC_OR_DEPARTMENT_OTHER): Payer: Self-pay

## 2021-08-14 ENCOUNTER — Ambulatory Visit (INDEPENDENT_AMBULATORY_CARE_PROVIDER_SITE_OTHER): Payer: No Typology Code available for payment source | Admitting: Nurse Practitioner

## 2021-08-14 ENCOUNTER — Encounter (HOSPITAL_BASED_OUTPATIENT_CLINIC_OR_DEPARTMENT_OTHER): Payer: Self-pay | Admitting: Pharmacist

## 2021-08-14 VITALS — BP 128/88 | HR 86 | Temp 98.7°F | Ht 69.0 in | Wt 220.0 lb

## 2021-08-14 DIAGNOSIS — Z0000 Encounter for general adult medical examination without abnormal findings: Secondary | ICD-10-CM | POA: Diagnosis not present

## 2021-08-14 DIAGNOSIS — Z6832 Body mass index (BMI) 32.0-32.9, adult: Secondary | ICD-10-CM

## 2021-08-14 DIAGNOSIS — Z6833 Body mass index (BMI) 33.0-33.9, adult: Secondary | ICD-10-CM

## 2021-08-14 MED ORDER — SEMAGLUTIDE-WEIGHT MANAGEMENT 1 MG/0.5ML ~~LOC~~ SOAJ
1.0000 mg | SUBCUTANEOUS | 1 refills | Status: AC
  Filled 2021-08-14 – 2021-10-08 (×2): qty 2, 28d supply, fill #0
  Filled 2021-11-01: qty 2, 28d supply, fill #1

## 2021-08-14 MED ORDER — SEMAGLUTIDE-WEIGHT MANAGEMENT 0.25 MG/0.5ML ~~LOC~~ SOAJ
0.2500 mg | SUBCUTANEOUS | 0 refills | Status: AC
  Filled 2021-08-14 – 2021-08-21 (×2): qty 2, 28d supply, fill #0

## 2021-08-14 MED ORDER — SEMAGLUTIDE-WEIGHT MANAGEMENT 2.4 MG/0.75ML ~~LOC~~ SOAJ
2.4000 mg | SUBCUTANEOUS | 1 refills | Status: AC
  Filled 2021-08-14: qty 3, 28d supply, fill #0

## 2021-08-14 MED ORDER — SEMAGLUTIDE-WEIGHT MANAGEMENT 0.5 MG/0.5ML ~~LOC~~ SOAJ
0.5000 mg | SUBCUTANEOUS | 0 refills | Status: AC
  Filled 2021-08-14 – 2021-09-10 (×3): qty 2, 28d supply, fill #0

## 2021-08-14 MED ORDER — SEMAGLUTIDE-WEIGHT MANAGEMENT 1.7 MG/0.75ML ~~LOC~~ SOAJ
1.7000 mg | SUBCUTANEOUS | 1 refills | Status: AC
  Filled 2021-08-14 – 2021-11-20 (×2): qty 3, 28d supply, fill #0

## 2021-08-14 NOTE — Progress Notes (Signed)
? ?BP 128/88   Pulse 86   Temp 98.7 ?F (37.1 ?C)   Ht '5\' 9"'  (1.753 m)   Wt 220 lb (99.8 kg)   SpO2 97%   BMI 32.49 kg/m?   ? ?Subjective:  ? ? Patient ID: Elizabeth Franklin, female    DOB: 07-Aug-1980, 41 y.o.   MRN: [id number] ? ?HPI: ?Elizabeth Franklin is a 41 y.o. female presenting on 08/14/2021 for comprehensive medical examination.  ? ?Current medical concerns include: Weight gain, neck pain (due to breast)  She is currently working with plastic surgery to get approval for breast reduction. She is concerned this will not be approved. She would like to try wegovy again for weight loss to see if this will be helpful for her increased breast size.  ? ?She reports regular vision exams q1-5y: yes ?She reports regular dental exams q 63m yes ?Her diet consists of:  overall healthy options, monitors portion sizes ?She endorses exercise and/or activity of:  cardio ?She works in:  iClinical cytogeneticistat [employer]? ?She endorses ETOH use ( social ) ?She denies nictoine use  ?She denies illegal substance use  ? ?She reports regular menses with no concerns.  ?She is currently sexually active with one sexual partner ?She denies concerns today about STI ? ?She denies concerns about skin changes today  ?She denies concerns about bowel changes today  ?She denies concerns about bladder changes today  ? ?Most Recent Depression Screen:  ? ?  08/14/2021  ?  8:36 AM 07/19/2020  ?  3:39 PM 10/09/2014  ?  8:11 AM  ?Depression screen PHQ 2/9  ?Decreased Interest 0 0 0  ?Down, Depressed, Hopeless 0 0 0  ?PHQ - 2 Score 0 0 0  ?Altered sleeping  3   ?Tired, decreased energy  2   ?Change in appetite  0   ?Feeling bad or failure about yourself   0   ?Trouble concentrating  2   ?Moving slowly or fidgety/restless  2   ?Suicidal thoughts  0   ?PHQ-9 Score  9   ?Difficult doing work/chores  Somewhat difficult   ? ?Most Recent Anxiety Screen:  ? ?  07/19/2020  ?  3:40 PM  ?GAD 7 : Generalized Anxiety Score  ?Nervous, Anxious, on Edge 1   ?Control/stop worrying 1  ?Worry too much - different things 1  ?Trouble relaxing 1  ?Restless 1  ?Easily annoyed or irritable 1  ?Afraid - awful might happen 0  ?Total GAD 7 Score 6  ?Anxiety Difficulty Somewhat difficult  ? ?Most Recent Fall Screen: ? ?  08/14/2021  ?  8:35 AM 07/19/2020  ?  3:39 PM  ?Fall Risk   ?Falls in the past year? 0 0  ?Number falls in past yr: 0 0  ?Injury with Fall? 0   ?Risk for fall due to : No Fall Risks No Fall Risks  ?Follow up Falls evaluation completed;Education provided Falls evaluation completed  ? ? ?All ROS negative except what is listed above and in the HPI.  ? ?Past medical history, surgical history, medications, allergies, family history and social history reviewed with patient today and changes made to appropriate areas of the chart.  ?Past Medical History:  ?Past Medical History:  ?Diagnosis Date  ? Acute maxillary sinusitis 08/02/2020  ? Anxiety   ? Bilateral posterior neck pain 07/19/2020  ? Body mass index (BMI) 34.0-34.9, adult 07/19/2020  ? Cephalalgia 08/01/2020  ? Chronic headaches   ?  Dermatitis 08/30/2020  ? Encounter for medical examination to establish care 07/19/2020  ? Excessive or frequent menstruation 11/21/2013  ? Fall down stairs 10/26/2020  ? Laboratory tests ordered as part of a complete physical exam (CPE) 07/19/2020  ? Neck pain with history of cervical spinal surgery 08/01/2020  ? Screening mammogram for breast cancer 07/19/2020  ? Slow transit constipation 07/19/2020  ? Weight gain 07/19/2020  ? ?Medications:  ?Current Outpatient Medications on File Prior to Visit  ?Medication Sig  ? amoxicillin (AMOXIL) 250 MG capsule Take one capsule TID until finished.  ? betamethasone dipropionate (DIPROLENE) 0.05 % ointment Apply topically 2 (two) times daily. To eczema rash.  ? COVID-19 At Home Antigen Test KIT Use as directed  ? cyclobenzaprine (FLEXERIL) 10 MG tablet Take 1 tablet (10 mg total) by mouth at bedtime as needed for muscle spasms.  ? EPINEPHrine 0.3 mg/0.3 mL IJ  SOAJ injection Inject 0.3 mg into the muscle as needed for anaphylaxis.  ? gabapentin (NEURONTIN) 100 MG capsule Take 1 capsule (100 mg total) by mouth 3 (three) times daily as needed.  ? nystatin-triamcinolone (MYCOLOG II) cream Apply to the affected area 2 (two) times daily.  ? ondansetron (ZOFRAN-ODT) 4 MG disintegrating tablet Take 1 tablet (4 mg total) by mouth every 8 (eight) hours as needed for nausea or vomiting.  ? rizatriptan (MAXALT) 10 MG tablet Take 1 tablet (10 mg total) by mouth as needed for migraine. May repeat in 2 hours if needed  ? ?No current facility-administered medications on file prior to visit.  ? ?Surgical History:  ?Past Surgical History:  ?Procedure Laterality Date  ? APPENDECTOMY    ? CERVICAL DISC SURGERY    ? DILITATION & CURRETTAGE/HYSTROSCOPY WITH THERMACHOICE ABLATION N/A 01/30/2014  ? Procedure: DILATATION & CURETTAGE/HYSTEROSCOPY WITH THERMACHOICE ABLATION;  Surgeon: Florian Buff, MD;  Location: [facility];  Service: Gynecology;  Laterality: N/A;  Total Ablation Therapy Time 9 minutes 5 seconds; 86-88 deg C  ? ENDOMETRIAL ABLATION    ? LAPAROSCOPIC BILATERAL SALPINGECTOMY Bilateral 01/30/2014  ? Procedure: LAPAROSCOPIC BILATERAL SALPINGECTOMY;  Surgeon: Florian Buff, MD;  Location: [facility];  Service: Gynecology;  Laterality: Bilateral;  ? ?Allergies:  ?Allergies  ?Allergen Reactions  ? Bee Venom Swelling  ? Codeine Nausea And Vomiting  ? ?Social History:  ?Social History  ? ?Socioeconomic History  ? Marital status: Married  ?  Spouse name: Mervin Kung  ? Number of children: 1  ? Years of education: Not on file  ? Highest education level: Not on file  ?Occupational History  ? Not on file  ?Tobacco Use  ? Smoking status: Former  ?  Packs/day: 0.25  ?  Years: 1.00  ?  Pack years: 0.25  ?  Types: Cigarettes  ?  Quit date: 01/26/1999  ?  Years since quitting: 22.5  ? Smokeless tobacco: Never  ?Vaping Use  ? Vaping Use: Never used  ?Substance and Sexual Activity  ? Alcohol use: Yes  ?   Comment: occassional  ? Drug use: No  ? Sexual activity: Yes  ?  Partners: Male  ?  Birth control/protection: Surgical  ?  Comment: monogomous relationship  ?Other Topics Concern  ? Not on file  ?Social History Narrative  ? Not on file  ? ?Social Determinants of Health  ? ?Financial Resource Strain: Not on file  ?Food Insecurity: Not on file  ?Transportation Needs: Not on file  ?Physical Activity: Not on file  ?Stress: Not on file  ?Social  Connections: Not on file  ?Intimate Partner Violence: Not on file  ? ?Social History  ? ?Tobacco Use  ?Smoking Status Former  ? Packs/day: 0.25  ? Years: 1.00  ? Pack years: 0.25  ? Types: Cigarettes  ? Quit date: 01/26/1999  ? Years since quitting: 22.5  ?Smokeless Tobacco Never  ? ?Social History  ? ?Substance and Sexual Activity  ?Alcohol Use Yes  ? Comment: occassional  ? ?Family History:  ?Family History  ?Problem Relation Age of Onset  ? Hypertension Mother   ? Mental illness Mother   ? Heart failure Father   ? Heart disease Father   ? Hypertension Father   ? Diabetes Brother   ? Hyperlipidemia Brother   ? Diabetes Paternal Grandmother   ? Heart disease Paternal Grandmother   ? Breast cancer Paternal Grandmother 27  ? Breast cancer Paternal Aunt 61  ? ? ?   ?Objective:  ?  ?BP 128/88   Pulse 86   Temp 98.7 ?F (37.1 ?C)   Ht '5\' 9"'  (1.753 m)   Wt 220 lb (99.8 kg)   SpO2 97%   BMI 32.49 kg/m?   ?Wt Readings from Last 3 Encounters:  ?08/14/21 220 lb (99.8 kg)  ?08/09/21 216 lb 12.8 oz (98.3 kg)  ?03/25/21 211 lb (95.7 kg)  ?  ?Physical Exam ? ?Results for orders placed or performed during the hospital encounter of 02/12/21  ?Basic metabolic panel  ?Result Value Ref Range  ? Sodium 138 135 - 145 mmol/L  ? Potassium 3.7 3.5 - 5.1 mmol/L  ? Chloride 102 98 - 111 mmol/L  ? CO2 26 22 - 32 mmol/L  ? Glucose, Bld 90 70 - 99 mg/dL  ? BUN 14 6 - 20 mg/dL  ? Creatinine, Ser 1.00 0.44 - 1.00 mg/dL  ? Calcium 8.7 (L) 8.9 - 10.3 mg/dL  ? GFR, Estimated >60 >60 mL/min  ? Anion gap 10 5 -  15  ?CBC  ?Result Value Ref Range  ? WBC 8.3 4.0 - 10.5 K/uL  ? RBC 4.02 3.87 - 5.11 MIL/uL  ? Hemoglobin 12.9 12.0 - 15.0 g/dL  ? HCT 38.7 36.0 - 46.0 %  ? MCV 96.3 80.0 - 100.0 fL  ? MCH 32.1 26.0 - 34.0 pg  ? MCHC

## 2021-08-14 NOTE — Patient Instructions (Signed)
It was a pleasure seeing you today. I hope your time spent with [place] was pleasant and helpful. Please let us know if there is anything we can do to improve the service you receive.  ? ?Today we discussed concerns with: ? ?Body mass index (BMI) of 33.0-33.9 in adult ? ?Health maintenance examination ? ? ?I have sent the wegovy into the pharmacy for you. If this needs a PA it will take a few days, but because you have been on it in the past we may not need this.  ?Bring up the labs you had drawn and I will put them in your chart ? ?The following orders have been placed for you today: ? ? ?Important Office Information ?Lab Results ?If labs were ordered, please note that you will see results through MyChart as soon as they come available from [facility].  ?It takes up to 5 business days for the results to be routed to me and for me to review them once all of the lab results have come through from [facility] I will make recommendations based on your results and send these through MyChart or someone from the office will call you to discuss. If your labs are abnormal, we may contact you to schedule a visit to discuss the results and make recommendations.  ?If you have not heard from [place] within 5 business days or you have waited longer than a week and your lab results have not come through on MyChart, please feel free to call the office or send a message through MyChart to follow-up on these labs.  ? ?Referrals ?If referrals were placed today, the office where the referral was sent will contact you either by phone or through MyChart to set up scheduling. Please note that it can take up to a week for the referral office to contact you. If you do not hear from them in a week, please contact the referral office directly to inquire about scheduling.  ? ?Condition Treated ?If your condition worsens or you begin to have new symptoms, please schedule a follow-up appointment for further evaluation. If you are not sure if an appointment is  needed, you may call the office to leave a message for the nurse and someone will contact you with recommendations.  ?If you have an urgent or life threatening emergency, please do not call the office, but seek emergency evaluation by calling 911 or going to the nearest emergency room for evaluation.  ? ?MyChart and Phone Calls ?Please do not use MyChart for urgent messages. It may take up to 3 business days for MyChart messages to be read by staff and if they are unable to handle the request, an additional 3 business days for them to be routed to me and for my response.  ?Messages sent to the provider through MyChart do not come directly to the provider, please allow time for these messages to be routed and for me to respond.  ?We get a large volume of MyChart messages daily and these are responded to in the order received.  ? ?For urgent messages, please call the office at [phone] and speak with the front office staff or leave a message on the line of my assistant for guidance.  ?We are seeing patients from the hours of 8:00 am through 5:00 pm and calls directly to the nurse may not be answered immediately due to seeing patients, but your call will be returned as soon as possible.  ?Phone  messages received after 4:00  PM Monday through Thursday may not be returned until the following business day. Phone messages received after 11:00 AM on Friday may not be returned until Monday.  ? ?After Hours ?We share on call hours with providers from other offices. If you have an urgent need after hours that cannot wait until the next business day, please contact the on call provider by calling the office number. A nurse will speak with you and contact the provider if needed for recommendations.  ?If you have an urgent or life threatening emergency after hours, please do not call the on call provider, but seek emergency evaluation by calling 911 or going to the nearest emergency room for evaluation.  ? ?Paperwork ?All  paperwork requires a minimum of 5 days to complete and return to you or the designated personnel. Please keep this in mind when bringing in forms or sending requests for paperwork completion to the office.  ?  ?

## 2021-08-16 ENCOUNTER — Encounter: Payer: Self-pay | Admitting: Plastic Surgery

## 2021-08-19 ENCOUNTER — Other Ambulatory Visit (HOSPITAL_BASED_OUTPATIENT_CLINIC_OR_DEPARTMENT_OTHER): Payer: Self-pay

## 2021-08-19 NOTE — Assessment & Plan Note (Signed)
Will plan to restart wegovy to see if this is helpful for weight loss efforts. Increased weight and breast size are contributing to her neck and upper back pain. Will restart medication and monitor.  ?

## 2021-08-20 ENCOUNTER — Encounter (HOSPITAL_BASED_OUTPATIENT_CLINIC_OR_DEPARTMENT_OTHER): Payer: Self-pay | Admitting: Pharmacist

## 2021-08-20 ENCOUNTER — Other Ambulatory Visit (HOSPITAL_BASED_OUTPATIENT_CLINIC_OR_DEPARTMENT_OTHER): Payer: Self-pay

## 2021-08-20 ENCOUNTER — Encounter (HOSPITAL_BASED_OUTPATIENT_CLINIC_OR_DEPARTMENT_OTHER): Payer: Self-pay | Admitting: Nurse Practitioner

## 2021-08-20 NOTE — Therapy (Deleted)
?OUTPATIENT PHYSICAL THERAPY CERVICAL EVALUATION ? ? ?Patient Name: Elizabeth Franklin ?MRN: [id number] ?DOB:1980/09/13, 41 y.o., female ?Today's Date: 08/20/2021 ? ? ? ?Past Medical History:  ?Diagnosis Date  ? Acute maxillary sinusitis 08/02/2020  ? Anxiety   ? Bilateral posterior neck pain 07/19/2020  ? Body mass index (BMI) 34.0-34.9, adult 07/19/2020  ? Cephalalgia 08/01/2020  ? Chronic headaches   ? Dermatitis 08/30/2020  ? Encounter for medical examination to establish care 07/19/2020  ? Excessive or frequent menstruation 11/21/2013  ? Fall down stairs 10/26/2020  ? Laboratory tests ordered as part of a complete physical exam (CPE) 07/19/2020  ? Neck pain with history of cervical spinal surgery 08/01/2020  ? Screening mammogram for breast cancer 07/19/2020  ? Slow transit constipation 07/19/2020  ? Weight gain 07/19/2020  ? ?Past Surgical History:  ?Procedure Laterality Date  ? APPENDECTOMY    ? CERVICAL DISC SURGERY    ? DILITATION & CURRETTAGE/HYSTROSCOPY WITH THERMACHOICE ABLATION N/A 01/30/2014  ? Procedure: DILATATION & CURETTAGE/HYSTEROSCOPY WITH THERMACHOICE ABLATION;  Surgeon: Lazaro Arms, MD;  Location: [facility];  Service: Gynecology;  Laterality: N/A;  Total Ablation Therapy Time 9 minutes 5 seconds; 86-88 deg C  ? ENDOMETRIAL ABLATION    ? LAPAROSCOPIC BILATERAL SALPINGECTOMY Bilateral 01/30/2014  ? Procedure: LAPAROSCOPIC BILATERAL SALPINGECTOMY;  Surgeon: Lazaro Arms, MD;  Location: [facility];  Service: Gynecology;  Laterality: Bilateral;  ? ?Patient Active Problem List  ? Diagnosis Date Noted  ? Symptomatic mammary hypertrophy 07/03/2021  ? Snoring 03/25/2021  ? Tachycardia, paroxysmal (HCC) 03/25/2021  ? Generalized anxiety disorder 01/03/2021  ? Sacral back pain 10/26/2020  ? Encounter for weight management 08/30/2020  ? Eczema 08/30/2020  ? Body mass index (BMI) of 32.0-32.9 in adult 08/30/2020  ? Cervicogenic headache 08/01/2020  ? Bilateral posterior neck pain 07/19/2020  ? Allergy to bee sting  07/19/2020  ? Cervical radiculopathy, chronic 07/19/2020  ? Stress incontinence in female 07/19/2020  ? 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 Dysmenorrhea 11/21/2013  ? ? ?PCP: [name], NP ? ?REFERRING PROVIDER: [name], NP ? ?REFERRING DIAG: N62 (ICD-10-CM) - Symptomatic mammary hypertrophy ? ?THERAPY DIAG:  ?No diagnosis found. ? ?ONSET DATE: *** ? ?SUBJECTIVE:                                                                                                                                                                                                        ? ?SUBJECTIVE STATEMENT: ?*** ? ?PERTINENT HISTORY:  ?***neck surgery, headaches ? ?PAIN:  ?Are you having pain?  Yes: NPRS scale: ***/10 ?Pain location: *** ?Pain description: *** ?Aggravating factors: *** ?Relieving factors: *** ? ?PRECAUTIONS: {Therapy precautions:24002} ? ?WEIGHT BEARING RESTRICTIONS {Yes ***/No:24003} ? ?FALLS:  ?Has patient fallen in last 6 months? {fallsyesno:27318} ? ?LIVING ENVIRONMENT: ?Lives with: {[facility] lives with:25569::"lives with their family"} ?Lives in: {Lives in:25570} ?Stairs: {opstairs:27293} ?Has following equipment at home: {Assistive devices:23999} ? ?OCCUPATION: *** ? ?PLOF: {PLOF:24004} ? ?PATIENT GOALS *** ? ?OBJECTIVE:  ? ?DIAGNOSTIC FINDINGS:  ?*** ? ?PATIENT SURVEYS:  ?{rehab surveys:24030} ? ? ?COGNITION: ?Overall cognitive status: {cognition:24006} ? ? ?SENSATION: ?{sensation:27233} ? ?POSTURE:  ?*** ? ?PALPATION: ?***  ? ?CERVICAL ROM:  ? ?Active ROM A/PROM (deg) ?08/20/2021  ?Flexion   ?Extension   ?Right lateral flexion   ?Left lateral flexion   ?Right rotation   ?Left rotation   ? (Blank rows = not tested) ? ? ? UE Measurements ?Upper Extremity Right ?08/20/2021 Left ?08/20/2021  ? A/PROM MMT A/PROM MMT  ?Shoulder Flexion      ?Shoulder Extension      ?Shoulder Abduction      ?Shoulder Adduction      ?Shoulder Internal Rotation      ?Shoulder External Rotation      ?Elbow Flexion      ?Elbow  Extension      ?Wrist Flexion      ?Wrist Extension      ?Wrist Supination      ?Wrist Pronation      ?Wrist Ulnar Deviation      ?Wrist Radial Deviation      ?Grip Strength NA  NA   ?  (Blank rows = not tested) ?  * pain ? ? ?CERVICAL SPECIAL TESTS:  ?{Cervical special tests:25246} ? ? ?FUNCTIONAL TESTS:  ?{Functional tests:24029} ? ?PATIENT SURVEYS:  ?{rehab surveys:24030:a} ? ?TODAY'S TREATMENT:  ?***08/20/2021 ? ?Therapeutic Exercise: ? Aerobic: ?Supine: ?Prone: ? Seated: ? Standing: ?Neuromuscular Re-education: ?Manual Therapy: ?Therapeutic Activity: ?Self Care: ?Trigger Point Dry Needling:  ?Modalities:  ? ? ? ?PATIENT EDUCATION:  ?Education details: on current presentation, on HEP, on clinical outcomes score and POC ?Person educated: Patient ?Education method: Explanation, Demonstration, and Handouts ?Education comprehension: verbalized understanding ? ? ? ?HOME EXERCISE PROGRAM: ?*** ? ?ASSESSMENT: ? ?CLINICAL IMPRESSION: ?Patient is a *** y.o. *** who was seen today for physical therapy evaluation and treatment for ***.  ? ? ?OBJECTIVE IMPAIRMENTS {opptimpairments:25111}.  ? ?ACTIVITY LIMITATIONS {activity limitations:25113}.  ? ?PERSONAL FACTORS {Personal factors:25162} are also affecting patient's functional outcome.  ? ? ?REHAB POTENTIAL: Good ? ?CLINICAL DECISION MAKING: Stable/uncomplicated ? ?EVALUATION COMPLEXITY: Low ? ? ?GOALS: ?Goals reviewed with patient?  yes ? ?SHORT TERM GOALS: ? ?Patient will be independent in self management strategies to improve quality of life and functional outcomes. ?Baseline: new program ?Target date: 09/10/2021 ?Goal status: INITIAL ? ?2.  Patient will report at least 50% improvement in overall symptoms and/or function to demonstrate improved functional mobility ?Baseline: 0% ?Target date: 09/10/2021 ?Goal status: INITIAL ? ?3.  *** ?Baseline: *** ?Target date: 09/10/2021 ?Goal status: INITIAL ? ? ? ?LONG TERM GOALS: ? ?Patient will report at least 75% improvement in overall  symptoms and/or function to demonstrate improved functional mobility ?Baseline: 0% ?Target date: 10/01/2021 ?Goal status: INITIAL ? ?2.  *** ?Baseline: *** ?Target date: 10/01/2021 ?Goal status: INITIAL ? ?3.  *** ?Baseline: *** ?Target date: 10/01/2021 ?Goal status: INITIAL ? ? ? ?PLAN: ?PT FREQUENCY: 1x/week ? ?PT DURATION: 6 weeks ? ?PLANNED INTERVENTIONS: Therapeutic exercises, Therapeutic activity, Neuromuscular re-education, Balance training, Gait training, Patient/Family  education, Joint manipulation, Joint mobilization, Dry Needling, Electrical stimulation, Spinal mobilization, Cryotherapy, Moist heat, Ionotophoresis 4mg /ml Dexamethasone, and Manual therapy ? ?PLAN FOR NEXT SESSION: *** ? ?3:34 PM, 08/20/21 ?Tereasa CoopMichele Tesneem Dufrane, DPT ?Physical Therapy with [facility] ?[facility]  ?[phone] office ? ? ? ? ?  ?

## 2021-08-21 ENCOUNTER — Other Ambulatory Visit (HOSPITAL_BASED_OUTPATIENT_CLINIC_OR_DEPARTMENT_OTHER): Payer: Self-pay

## 2021-08-21 ENCOUNTER — Ambulatory Visit: Payer: No Typology Code available for payment source | Admitting: Physical Therapy

## 2021-08-27 ENCOUNTER — Telehealth (HOSPITAL_BASED_OUTPATIENT_CLINIC_OR_DEPARTMENT_OTHER): Payer: Self-pay

## 2021-08-27 NOTE — Telephone Encounter (Signed)
Received fax from [employer] that Reginal Lutes is approved. Ref # is [id number] ?

## 2021-09-04 ENCOUNTER — Encounter (HOSPITAL_BASED_OUTPATIENT_CLINIC_OR_DEPARTMENT_OTHER): Payer: Self-pay | Admitting: Nurse Practitioner

## 2021-09-05 NOTE — Therapy (Signed)
?OUTPATIENT PHYSICAL THERAPY CERVICAL EVALUATION ? ? ?Patient Name: Elizabeth Franklin ?MRN: [id number] ?DOB:Sep 20, 1980, 41 y.o., female ?Today's Date: 09/16/2021 ? ? PT End of Session - 09/16/21 1601   ? ? Visit Number 1   ? Number of Visits 6   ? Date for PT Re-Evaluation 10/28/21   ? Progress Note Due on Visit 10   ? PT Start Time 1603   ? PT Stop Time 1639   ? PT Time Calculation (min) 36 min   ? ?  ?  ? ?  ? ? ?Past Medical History:  ?Diagnosis Date  ? Acute maxillary sinusitis 08/02/2020  ? Anxiety   ? Bilateral posterior neck pain 07/19/2020  ? Body mass index (BMI) 34.0-34.9, adult 07/19/2020  ? Cephalalgia 08/01/2020  ? Chronic headaches   ? Dermatitis 08/30/2020  ? Encounter for medical examination to establish care 07/19/2020  ? Excessive or frequent menstruation 11/21/2013  ? Fall down stairs 10/26/2020  ? Laboratory tests ordered as part of a complete physical exam (CPE) 07/19/2020  ? Neck pain with history of cervical spinal surgery 08/01/2020  ? Screening mammogram for breast cancer 07/19/2020  ? Slow transit constipation 07/19/2020  ? Weight gain 07/19/2020  ? ?Past Surgical History:  ?Procedure Laterality Date  ? APPENDECTOMY    ? CERVICAL DISC SURGERY    ? DILITATION & CURRETTAGE/HYSTROSCOPY WITH THERMACHOICE ABLATION N/A 01/30/2014  ? Procedure: DILATATION & CURETTAGE/HYSTEROSCOPY WITH THERMACHOICE ABLATION;  Surgeon: Lazaro ArmsLuther H Eure, MD;  Location: [facility];  Service: Gynecology;  Laterality: N/A;  Total Ablation Therapy Time 9 minutes 5 seconds; 86-88 deg C  ? ENDOMETRIAL ABLATION    ? LAPAROSCOPIC BILATERAL SALPINGECTOMY Bilateral 01/30/2014  ? Procedure: LAPAROSCOPIC BILATERAL SALPINGECTOMY;  Surgeon: Lazaro ArmsLuther H Eure, MD;  Location: [facility];  Service: Gynecology;  Laterality: Bilateral;  ? ?Patient Active Problem List  ? Diagnosis Date Noted  ? Symptomatic mammary hypertrophy 07/03/2021  ? Snoring 03/25/2021  ? Tachycardia, paroxysmal (HCC) 03/25/2021  ? Generalized anxiety disorder 01/03/2021  ? Sacral back  pain 10/26/2020  ? Encounter for weight management 08/30/2020  ? Eczema 08/30/2020  ? Body mass index (BMI) of 32.0-32.9 in adult 08/30/2020  ? Cervicogenic headache 08/01/2020  ? Bilateral posterior neck pain 07/19/2020  ? Allergy to bee sting 07/19/2020  ? Cervical radiculopathy, chronic 07/19/2020  ? Stress incontinence in female 07/19/2020  ? 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 Dysmenorrhea 11/21/2013  ? ? ?PCP: Tollie EthEarly, Sara E, NP ? ?REFERRING PROVIDER:Dillingham, Alena Billslaire S, DO ? ?REFERRING DIAG: N62 (ICD-10-CM) - Symptomatic mammary hypertrophy ? ?THERAPY DIAG:  ?Cervicalgia - Plan: PT plan of care cert/re-cert ? ?Muscle weakness (generalized) - Plan: PT plan of care cert/re-cert ? ?ONSET DATE: since highschool ? ?SUBJECTIVE:                                                                                                                                                                                                        ? ?  SUBJECTIVE STATEMENT: ?States that she has had pain in her neck for years but overall the pain has changed and has worsened. States she has pain with turning her neck and pain is sharp. States she has difficulty curling her hair or if her arms are up overhead for a couple minutes they get tired and her neck starts to hurt.  ? ?States that anything heavy on her shoulders bother her as it pulls on her neck and she can't use a regular purse.  States she has tried tons of pillows with minimal relief. Pain radiates down back of shoulders/upper back when it is back. States she used to work out a lot but she can no longer run because of the weight of her chest and can no longer lift weights because of the pain in her neck. She has tried wearing multiple sports bras and compression tank tops but this does not help significantly. ? ?States that she still walks but feels like her inability to participate in physical activity is negatively effecting her mental health. ?PERTINENT HISTORY:   ?neck surgery, headaches ? ?PAIN:  ?Are you having pain? Yes: NPRS scale: 5/10 ?Pain location: left upper shoulder ?Pain description: achy, sharp ?Aggravating factors: OH motions in arm, neck motions ?Relieving factors: rest ? ?PRECAUTIONS: None ? ?WEIGHT BEARING RESTRICTIONS No ? ?FALLS:  ?Has patient fallen in last 6 months? No ? 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 ? ?PLOF: Independent ? ?PATIENT GOALS to have less pain, to get back to running and the gym ? ?OBJECTIVE:  ? ?DIAGNOSTIC FINDINGS:  ?No recent imaging ? ? ? ?COGNITION: ?Overall cognitive status: Within functional limits for tasks assessed ? ? ? ?POSTURE:  ?Forward head, rounded shoulders, slumped thoracic posture. ? ?PALPATION: ?Increased resting tone in bilateral cervical and thoracic paraspinals, traps and posterior shoulder musculatures, tenderness to palpation in these areas with L>R  ? ?CERVICAL ROM:  ? ?Active ROM A/PROM (deg) ?09/16/2021  ?Flexion 40*  ?Extension 25*  ?Right lateral flexion 12*  ?Left lateral flexion 10*  ?Right rotation 42*  ?Left rotation 30*  ? (Blank rows = not tested) ?*Pain along center/sides of lower neck ? ? ? UE Measurements ?Upper Extremity Right ?09/16/2021 Left ?09/16/2021  ? A/PROM MMT A/PROM MMT  ?Shoulder Flexion WFL* 4* WFL* 4*  ?Shoulder Extension      ?Shoulder Abduction WFL 4* WFL 4*  ?Shoulder Adduction [facility]  WFL 4*  ?Shoulder Internal Rotation WFL 4+ WFL 4*  ?Shoulder External Rotation  4    ?Elbow Flexion      ?Elbow Extension      ?Wrist Flexion      ?Wrist Extension      ?Wrist Supination      ?Wrist Pronation      ?Wrist Ulnar Deviation      ?Wrist Radial Deviation      ?Grip Strength NA  NA   ?  (Blank rows = not tested) ?  * pain on ipsilateral neck  ?Patient is right handed. ? ?CERVICAL SPECIAL TESTS:  ?Sprulings painful but negative, distraction =positive relief noted ? ? ? ?TODAY'S TREATMENT:  ?09/16/2021 ?Therapeutic  Exercise: ? Aerobic: ?Supine:neck motion tiny bobble heads and ROT - 5 minutes ?Prone: ? Seated: ? Standing: ?Neuromuscular Re-education: ?Manual Therapy: ?Therapeutic Activity: ?Self Care: ?Trigger Point Dry Needling:  ?Modalities:  ? ? ? ?PATIENT EDUCATION:  ?Education details: on current presentation, on HEP, on clinical outcomes score and POC, on cervical  traction device, on posture ?Person educated: Patient ?Education method: Explanation, Demonstration, and Handouts ?Education comprehension: verbalized understanding ? ? ? ?HOME EXERCISE PROGRAM: ?None at this time ? ?ASSESSMENT: ? ?CLINICAL IMPRESSION: ?Patient is a 41 y.o. female who was seen today for physical therapy evaluation and treatment for neck and upper back pain. Patient presents with ROM limitations, increased resting muscle tone, reduced strength and postural deficits that are negatively effecting patient's current functional status. Patient would greatly benefit from skilled PT to improve overall function and quality of life.  ? ? ?OBJECTIVE IMPAIRMENTS decreased activity tolerance, decreased ROM, decreased strength, improper body mechanics, postural dysfunction, and pain.  ? ?ACTIVITY LIMITATIONS cleaning, community activity, occupation, and working out .  ? ?PERSONAL FACTORS Age, Past/current experiences, Time since onset of injury/illness/exacerbation, and 1 comorbidity: - Symptomatic mammary hypertrophy  are also affecting patient's functional outcome.  ? ? ?REHAB POTENTIAL: Good ? ?CLINICAL DECISION MAKING: Stable/uncomplicated ? ?EVALUATION COMPLEXITY: Low ? ? ?GOALS: ?Goals reviewed with patient?  yes ? ?SHORT TERM GOALS: ? ?Patient will be independent in self management strategies to improve quality of life and functional outcomes. ?Baseline: new program ?Target date: 10/07/2021 ?Goal status: INITIAL ? ?2.  Patient will report at least 50% improvement in overall symptoms and/or function to demonstrate improved functional mobility ?Baseline:  0% ?Target date: 10/07/2021 ?Goal status: INITIAL ? ?3.  Patient will be able to demonstrate at least 45 degrees of cervical rotation to improve ability to check blind spots while driving ?Baseline: see above ?Target date

## 2021-09-10 ENCOUNTER — Other Ambulatory Visit (HOSPITAL_COMMUNITY): Payer: Self-pay

## 2021-09-10 ENCOUNTER — Other Ambulatory Visit (HOSPITAL_BASED_OUTPATIENT_CLINIC_OR_DEPARTMENT_OTHER): Payer: Self-pay

## 2021-09-16 ENCOUNTER — Ambulatory Visit: Payer: No Typology Code available for payment source | Admitting: Physical Therapy

## 2021-09-16 ENCOUNTER — Encounter: Payer: Self-pay | Admitting: Physical Therapy

## 2021-09-16 DIAGNOSIS — M6281 Muscle weakness (generalized): Secondary | ICD-10-CM | POA: Diagnosis not present

## 2021-09-16 DIAGNOSIS — M542 Cervicalgia: Secondary | ICD-10-CM

## 2021-09-24 ENCOUNTER — Encounter: Payer: Self-pay | Admitting: Physical Therapy

## 2021-09-24 ENCOUNTER — Ambulatory Visit: Payer: No Typology Code available for payment source | Admitting: Physical Therapy

## 2021-09-24 DIAGNOSIS — M542 Cervicalgia: Secondary | ICD-10-CM

## 2021-09-24 NOTE — Therapy (Signed)
?OUTPATIENT PHYSICAL THERAPY TREATMENT NOTE ? ? ?Patient Name: Elizabeth Franklin Lugar ?MRN: [id number] ?DOB:Sep 08, 1980, 41 y.o., female ?Today's Date: 09/24/2021 ? ?END OF SESSION:  ? PT End of Session - 09/24/21 1556   ? ? Visit Number 2   ? Number of Visits 6   ? Date for PT Re-Evaluation 10/28/21   ? Progress Note Due on Visit 10   ? PT Start Time 1556   ? PT Stop Time 1638   ? PT Time Calculation (min) 42 min   ? ?  ?  ? ?  ? ? ?Past Medical History:  ?Diagnosis Date  ? Acute maxillary sinusitis 08/02/2020  ? Anxiety   ? Bilateral posterior neck pain 07/19/2020  ? Body mass index (BMI) 34.0-34.9, adult 07/19/2020  ? Cephalalgia 08/01/2020  ? Chronic headaches   ? Dermatitis 08/30/2020  ? Encounter for medical examination to establish care 07/19/2020  ? Excessive or frequent menstruation 11/21/2013  ? Fall down stairs 10/26/2020  ? Laboratory tests ordered as part of a complete physical exam (CPE) 07/19/2020  ? Neck pain with history of cervical spinal surgery 08/01/2020  ? Screening mammogram for breast cancer 07/19/2020  ? Slow transit constipation 07/19/2020  ? Weight gain 07/19/2020  ? ?Past Surgical History:  ?Procedure Laterality Date  ? APPENDECTOMY    ? CERVICAL DISC SURGERY    ? DILITATION & CURRETTAGE/HYSTROSCOPY WITH THERMACHOICE ABLATION N/A 01/30/2014  ? Procedure: DILATATION & CURETTAGE/HYSTEROSCOPY WITH THERMACHOICE ABLATION;  Surgeon: Lazaro ArmsLuther H Eure, MD;  Location: [facility];  Service: Gynecology;  Laterality: N/A;  Total Ablation Therapy Time 9 minutes 5 seconds; 86-88 deg C  ? ENDOMETRIAL ABLATION    ? LAPAROSCOPIC BILATERAL SALPINGECTOMY Bilateral 01/30/2014  ? Procedure: LAPAROSCOPIC BILATERAL SALPINGECTOMY;  Surgeon: Lazaro ArmsLuther H Eure, MD;  Location: [facility];  Service: Gynecology;  Laterality: Bilateral;  ? ?Patient Active Problem List  ? Diagnosis Date Noted  ? Symptomatic mammary hypertrophy 07/03/2021  ? Snoring 03/25/2021  ? Tachycardia, paroxysmal (HCC) 03/25/2021  ? Generalized anxiety disorder 01/03/2021  ?  Sacral back pain 10/26/2020  ? Encounter for weight management 08/30/2020  ? Eczema 08/30/2020  ? Body mass index (BMI) of 32.0-32.9 in adult 08/30/2020  ? Cervicogenic headache 08/01/2020  ? Bilateral posterior neck pain 07/19/2020  ? Allergy to bee sting 07/19/2020  ? Cervical radiculopathy, chronic 07/19/2020  ? Stress incontinence in female 07/19/2020  ? 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 Dysmenorrhea 11/21/2013  ? ? ?PCP: Tollie EthEarly, Sara E, NP ?  ?REFERRING PROVIDER:Dillingham, Alena Billslaire S, DO ?  ?REFERRING DIAG: N62 (ICD-10-CM) - Symptomatic mammary hypertrophy ?  ?THERAPY DIAG:  ?Cervicalgia - Plan: PT plan of care cert/re-cert ?  ?Muscle weakness (generalized) - Plan: PT plan of care cert/re-cert ?  ?ONSET DATE: since highschool ?  ?SUBJECTIVE:                                                                                                                                                                                                        ?  ?  SUBJECTIVE STATEMENT: ?09/24/2021 ?States that she is having a bad neck day today. States she has not done anything different. States she tried the traction machine but it is very uncomfortable. States that she can feel it going into her left shoulder. Reports she has been twisting about her back to check her blind spots.  ? ?Eval: States that she has had pain in her neck for years but overall the pain has changed and has worsened. States she has pain with turning her neck and pain is sharp. States she has difficulty curling her hair or if her arms are up overhead for a couple minutes they get tired and her neck starts to hurt.  ?  ?States that anything heavy on her shoulders bother her as it pulls on her neck and she can't use a regular purse.  States she has tried tons of pillows with minimal relief. Pain radiates down back of shoulders/upper back when it is back. States she used to work out a lot but she can no longer run because of the weight of her chest  and can no longer lift weights because of the pain in her neck. She has tried wearing multiple sports bras and compression tank tops but this does not help significantly. ?  ?States that she still walks but feels like her inability to participate in physical activity is negatively effecting her mental health. ?PERTINENT HISTORY:  ?neck surgery, headaches ?  ?PAIN:  ?Are you having pain? Yes: NPRS scale: 9/10 ?Pain location: left upper shoulder ?Pain description: achy, sharp ?Aggravating factors: OH motions in arm, neck motions ?Relieving factors: rest ?  ?PRECAUTIONS: None ?  ?WEIGHT BEARING RESTRICTIONS No ?  ?FALLS:  ?Has patient fallen in last 6 months? No ?  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 ?  ?PLOF: Independent ?  ?PATIENT GOALS to have less pain, to get back to running and the gym ?  ?OBJECTIVE:  ?  ?DIAGNOSTIC FINDINGS:  ?No recent imaging ?  ?  ?  ?COGNITION: ?Overall cognitive status: Within functional limits for tasks assessed ?  ?  ?  ?POSTURE:  ?Forward head, rounded shoulders, slumped thoracic posture. ?  ?PALPATION: ?Increased resting tone in bilateral cervical and thoracic paraspinals, traps and posterior shoulder musculatures, tenderness to palpation in these areas with L>R            ?  ?CERVICAL ROM:  ?  ?Active ROM A/PROM (deg) ?09/16/2021  ?Flexion 40*  ?Extension 25*  ?Right lateral flexion 12*  ?Left lateral flexion 10*  ?Right rotation 42*  ?Left rotation 30*  ? (Blank rows = not tested) ?*Pain along center/sides of lower neck ?  ?  ?          UE Measurements ?      ?Upper Extremity Right ?09/16/2021 Left ?09/16/2021  ?  A/PROM MMT A/PROM MMT  ?Shoulder Flexion WFL* 4* WFL* 4*  ?Shoulder Extension          ?Shoulder Abduction WFL 4* WFL 4*  ?Shoulder Adduction [facility]   WFL 4*  ?Shoulder Internal Rotation WFL 4+ WFL 4*  ?Shoulder External Rotation   4      ?Elbow Flexion          ?Elbow Extension          ?Wrist Flexion           ?Wrist Extension          ?Wrist Supination          ?  Wrist Pronation          ?Wrist Ulnar Deviation          ?Wrist Radial Deviation          ?Grip Strength NA   NA    ?                     (Blank rows = not tested) ?                     * pain on ipsilateral neck  ?Patient is right handed. ?  ?CERVICAL SPECIAL TESTS:  ?Sprulings painful but negative, distraction =positive relief noted ?  ?  ?  ?TODAY'S TREATMENT:  ?09/24/2021 ?Therapeutic Exercise: ?           Aerobic: ?Supine:self  mobilization with tennis ball 6 minutes, chin tucks 5 minutes cervical ROT  PROM by patient 5 minutes ?Prone: ?           Seated: ?           Standing: ?Neuromuscular Re-education: ?Manual Therapy: cervical traction, STM to suboccipitals and cervical paraspinals ?Therapeutic Activity: ?Self Care: ?Trigger Point Dry Needling:  ?Modalities:  ?  ?  ?  ?PATIENT EDUCATION:  ?Education details:on DN  and indications, risks and symptoms associated with it ?Person educated: Patient ?Education method: Explanation, Demonstration, and Handouts ?Education comprehension: verbalized understanding ?  ?  ?  ?HOME EXERCISE PROGRAM: ?[id number] ?  ?ASSESSMENT: ?  ?CLINICAL IMPRESSION: ?09/24/2021 ? Tolerated manual well with reduced pain noted and improved ROM. Added self mobility and, chin tucks and passive ROM of neck to HEP. This was all tolerated well and no pain noted with PROM of neck. Patient reported 2/10 pain at rest and 7/10 pain with movement end of session. Will continue with current POC. ? ?Eval: Patient is a 41 y.o. female who was seen today for physical therapy evaluation and treatment for neck and upper back pain. Patient presents with ROM limitations, increased resting muscle tone, reduced strength and postural deficits that are negatively effecting patient's current functional status. Patient would greatly benefit from skilled PT to improve overall function and quality of life.  ?  ?  ?OBJECTIVE IMPAIRMENTS decreased activity tolerance,  decreased ROM, decreased strength, improper body mechanics, postural dysfunction, and pain.  ?  ?ACTIVITY LIMITATIONS cleaning, community activity, occupation, and working out .  ?  ?PERSONAL FACTORS Ag

## 2021-09-24 NOTE — Progress Notes (Deleted)
   Referring Provider Early, Sung Amabile, NP [address] [place],  [place] [address]   CC: No chief complaint on file.     Elizabeth Franklin is an 41 y.o. female.  HPI: Patient is a 41 y.o. year old female here for follow up after completing physical therapy for pain related to macromastia.   She was seen for initial consult 08/09/2021 by Dr. Ulice Bold.  At that time, she complained of chronic upper back and neck discomfort in the context of large breasts.  STN noted to be 31 cm each side.  Preoperative bra size equals D cup.  Estimated excess breast removed at time of surgery equals 450 g each side.  Mammogram 12/2020 is negative.  Ordered physical therapy to see if that improves symptoms before decision for breast reduction surgery.  Today,    Review of Systems General: *** MSK: Endorses ongoing back and neck discomfort Skin: *** rashes  Physical Exam    08/14/2021    8:29 AM 08/09/2021    1:58 PM 03/25/2021    8:24 AM  Vitals with BMI  Height 5\' 9"  5\' 9"  5\' 8"   Weight 220 lbs 216 lbs 13 oz 211 lbs  BMI 32.47 32 32.09  Systolic 128 149  Diastolic 88 88 82  Pulse 86 74 97    General:  No acute distress,  Alert and oriented, Non-Toxic, Normal speech and affect Psych: Normal behavior and mood Respiratory: No increased WOB MSK: Ambulatory  Assessment/Plan  Patient is interested in pursuing surgical intervention for bilateral breast reduction. Patient has completed at least 6 weeks of physical therapy for pain related to macromastia.  Discussed with patient we would submit to insurance for authorization, discussed approval could take up to 6 weeks.   09/24/2021, 10:00 AM

## 2021-09-27 ENCOUNTER — Ambulatory Visit: Payer: No Typology Code available for payment source | Admitting: Physician Assistant

## 2021-10-01 ENCOUNTER — Encounter: Payer: No Typology Code available for payment source | Admitting: Physical Therapy

## 2021-10-08 ENCOUNTER — Encounter: Payer: No Typology Code available for payment source | Admitting: Physical Therapy

## 2021-10-08 ENCOUNTER — Other Ambulatory Visit (HOSPITAL_BASED_OUTPATIENT_CLINIC_OR_DEPARTMENT_OTHER): Payer: Self-pay

## 2021-10-09 ENCOUNTER — Other Ambulatory Visit (HOSPITAL_BASED_OUTPATIENT_CLINIC_OR_DEPARTMENT_OTHER): Payer: Self-pay

## 2021-10-14 ENCOUNTER — Ambulatory Visit: Payer: No Typology Code available for payment source | Admitting: Physical Therapy

## 2021-10-14 DIAGNOSIS — M6281 Muscle weakness (generalized): Secondary | ICD-10-CM | POA: Diagnosis not present

## 2021-10-14 DIAGNOSIS — M542 Cervicalgia: Secondary | ICD-10-CM

## 2021-10-14 NOTE — Therapy (Signed)
OUTPATIENT PHYSICAL THERAPY TREATMENT NOTE   Patient Name: Elizabeth Franklin MRN: [id number] DOB:Aug 05, 1980, 41 y.o., female Today's Date: 10/14/2021  END OF SESSION:   PT End of Session - 10/14/21 1606     Visit Number 3    Number of Visits 6    Date for PT Re-Evaluation 10/28/21    Progress Note Due on Visit 10    PT Start Time 1606    PT Stop Time 1644    PT Time Calculation (min) 38 min             Past Medical History:  Diagnosis Date   Acute maxillary sinusitis 08/02/2020   Anxiety    Bilateral posterior neck pain 07/19/2020   Body mass index (BMI) 34.0-34.9, adult 07/19/2020   Cephalalgia 08/01/2020   Chronic headaches    Dermatitis 08/30/2020   Encounter for medical examination to establish care 07/19/2020   Excessive or frequent menstruation 11/21/2013   Fall down stairs 10/26/2020   Laboratory tests ordered as part of a complete physical exam (CPE) 07/19/2020   Neck pain with history of cervical spinal surgery 08/01/2020   Screening mammogram for breast cancer 07/19/2020   Slow transit constipation 07/19/2020   Weight gain 07/19/2020   Past Surgical History:  Procedure Laterality Date   APPENDECTOMY     CERVICAL DISC SURGERY     DILITATION & CURRETTAGE/HYSTROSCOPY WITH THERMACHOICE ABLATION N/A 01/30/2014   Procedure: DILATATION & CURETTAGE/HYSTEROSCOPY WITH THERMACHOICE ABLATION;  Surgeon: Lazaro Arms, MD;  Location: [facility];  Service: Gynecology;  Laterality: N/A;  Total Ablation Therapy Time 9 minutes 5 seconds; 86-88 deg C   ENDOMETRIAL ABLATION     LAPAROSCOPIC BILATERAL SALPINGECTOMY Bilateral 01/30/2014   Procedure: LAPAROSCOPIC BILATERAL SALPINGECTOMY;  Surgeon: Lazaro Arms, MD;  Location: [facility];  Service: Gynecology;  Laterality: Bilateral;   Patient Active Problem List   Diagnosis Date Noted   Symptomatic mammary hypertrophy 07/03/2021   Snoring 03/25/2021   Tachycardia, paroxysmal (HCC) 03/25/2021   Generalized anxiety disorder 01/03/2021    Sacral back pain 10/26/2020   Encounter for weight management 08/30/2020   Eczema 08/30/2020   Body mass index (BMI) of 32.0-32.9 in adult 08/30/2020   Cervicogenic headache 08/01/2020   Bilateral posterior neck pain 07/19/2020   Allergy to bee sting 07/19/2020   Cervical radiculopathy, chronic 07/19/2020   Stress incontinence in female 07/19/2020   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 States she did yoga yesterday and it was slow stretched yoga and she feels really good today. States that she tried the traction and now it is better/comfortable.   Eval: States that she has had pain in her neck for years but overall the pain has changed and has worsened. States she has pain with turning her neck and pain is sharp. States she has difficulty curling her hair or if her arms are up overhead for a couple minutes they get tired and her neck starts to hurt.    States that anything heavy on her shoulders bother her as it pulls on her neck and she can't use a regular purse.  States she has tried tons of pillows with minimal relief. Pain radiates down back of shoulders/upper back when it is back. States she used to work out a lot but she can no longer run because of the weight of her chest and can no longer lift weights because of the pain in her neck. She has tried wearing multiple  sports bras and compression tank tops but this does not help significantly.   States that she still walks but feels like her inability to participate in physical activity is negatively effecting her mental health. PERTINENT HISTORY:  neck surgery, headaches   PAIN:  Are you having pain? Yes: NPRS scale: 2/10 Pain location: left upper shoulder Pain description: achy, sharp Aggravating factors: OH motions in arm, neck motions Relieving factors: rest   PRECAUTIONS: None   WEIGHT BEARING RESTRICTIONS No   FALLS:  Has patient fallen in last 6 months? 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   PLOF: Independent   PATIENT GOALS to have less pain, to get back to running and the gym   OBJECTIVE:    DIAGNOSTIC FINDINGS:  No recent imaging       COGNITION: Overall cognitive status: Within functional limits for tasks assessed       POSTURE:  Forward head, rounded shoulders, slumped thoracic posture.   PALPATION: Increased resting tone in bilateral cervical and thoracic paraspinals, traps and posterior shoulder musculatures, tenderness to palpation in these areas with L>R              CERVICAL ROM:    Active ROM A/PROM (deg) 09/16/2021  Flexion 40*  Extension 25*  Right lateral flexion 12*  Left lateral flexion 10*  Right rotation 42*  Left rotation 30*   (Blank rows = not tested) *Pain along center/sides of lower neck               UE Measurements       Upper Extremity Right 09/16/2021 Left 09/16/2021    A/PROM MMT A/PROM MMT  Shoulder Flexion WFL* 4* WFL* 4*  Shoulder Extension          Shoulder Abduction WFL 4* WFL 4*  Shoulder Adduction [facility]   WFL 4*  Shoulder Internal Rotation WFL 4+ WFL 4*  Shoulder External Rotation   4      Elbow Flexion          Elbow Extension          Wrist Flexion          Wrist Extension          Wrist Supination          Wrist Pronation  Wrist Ulnar  Deviation          Wrist Radial Deviation          Grip Strength NA   NA                         (Blank rows = not tested)                      * pain on ipsilateral neck  Patient is right handed.   CERVICAL SPECIAL TESTS:  Sprulings painful but negative, distraction =positive relief noted       TODAY'S TREATMENT:  10/14/2021 Therapeutic Exercise:            Aerobic: Supine: neck ROT 2 minutes x3, scapular protraction x2 2 minutes, shoulder flexion with post tilt 2 minutes,  Standing: shoulder flexion at wall 2 minutes, shoulder ER at wall 2 minute, shoulder taps at wall - too difficult             Seated:            Standing: Neuromuscular Re-education: Manual Therapy: traction to cervical spine 5 minutes Therapeutic Activity: Self Care: Trigger Point Dry Needling:  Modalities:        PATIENT EDUCATION:  Education details:on  anatomy, current presentation Person educated: Patient Education method: Explanation, Demonstration, and Handouts Education comprehension: verbalized understanding       HOME EXERCISE PROGRAM: [id number]   ASSESSMENT:   CLINICAL IMPRESSION: 10/14/2021 Overall patient is doing much better than previous session. Educated patent on posture and overall positioning with exercises. Encouraged patient to continue with yoga as this was helpful. Trailed strengthening exercise but increased tension, this reduced with traction and supine exercises. Will continue with current POC as tolerated   Eval: Patient is a 41 y.o. female who was seen today for physical therapy evaluation and treatment for neck and upper back pain. Patient presents with ROM limitations, increased resting muscle tone, reduced strength and postural deficits that are negatively effecting patient's current functional status. Patient would greatly benefit from skilled PT to improve overall function and quality of life.      OBJECTIVE IMPAIRMENTS decreased activity tolerance, decreased ROM,  decreased strength, improper body mechanics, postural dysfunction, and pain.    ACTIVITY LIMITATIONS cleaning, community activity, occupation, and working out .    PERSONAL FACTORS Age, Past/current experiences, Time since onset of injury/illness/exacerbation, and 1 comorbidity: - Symptomatic mammary hypertrophy  are also affecting patient's functional outcome.      REHAB POTENTIAL: Good   CLINICAL DECISION MAKING: Stable/uncomplicated   EVALUATION COMPLEXITY: Low     GOALS: Goals reviewed with patient?  yes   SHORT TERM GOALS:   Patient will be independent in self management strategies to improve quality of life and functional outcomes. Baseline: new program Target date: 10/07/2021 Goal status: INITIAL   2.  Patient will report at least 50% improvement in overall symptoms and/or function to demonstrate improved functional mobility Baseline: 0% Target date: 10/07/2021 Goal status: INITIAL   3.  Patient will be able to demonstrate at least 45 degrees of cervical rotation to improve ability to check blind spots while driving Baseline: see above Target date: 10/07/2021 Goal status: INITIAL       LONG TERM GOALS:   Patient will report at least 75% improvement in overall symptoms and/or function to demonstrate improved functional mobility Baseline: 0% Target date: 10/28/2021 Goal status: INITIAL   2.  Patient will  be able to perform task overhead with no additional weights for at least 2 minutes to improve ability to use arms overhead without pain in neck Baseline: unable Target date: 10/28/2021 Goal status: INITIAL   3.  Patient will be able to demonstrate at least 4+/5 MMT in bilateral UE to demonstrate improved strength and endurance to perform daily tasks at work. Baseline: see above Target date: 10/28/2021 Goal status: INITIAL       PLAN: PT FREQUENCY: 1x/week   PT DURATION: 6 weeks   PLANNED INTERVENTIONS: Therapeutic exercises, Therapeutic activity,  Neuromuscular re-education, Balance training, Gait training, Patient/Family education, Joint manipulation, Joint mobilization, Dry Needling, Electrical stimulation, Spinal mobilization, Cryotherapy, Moist heat, Ionotophoresis /ml Dexamethasone, and Manual therapy   PLAN FOR NEXT SESSION: posture, cervical traction, STM gentle as indicated    4:45 PM, 10/14/21 Tereasa Coop, DPT Physical Therapy with [facility]

## 2021-10-15 ENCOUNTER — Encounter: Payer: Self-pay | Admitting: Physical Therapy

## 2021-10-15 ENCOUNTER — Encounter: Payer: No Typology Code available for payment source | Admitting: Physical Therapy

## 2021-10-15 ENCOUNTER — Ambulatory Visit: Payer: No Typology Code available for payment source | Admitting: Physical Therapy

## 2021-10-15 DIAGNOSIS — M6281 Muscle weakness (generalized): Secondary | ICD-10-CM | POA: Diagnosis not present

## 2021-10-15 DIAGNOSIS — M542 Cervicalgia: Secondary | ICD-10-CM | POA: Diagnosis not present

## 2021-10-15 NOTE — Progress Notes (Signed)
   Referring Provider Early, Coralee Pesa, NP [name],  [place] [address]   CC:  Chief Complaint  Patient presents with   Follow-up      Elizabeth Franklin is an 41 y.o. female.  HPI: Patient is a 41 y.o. year old female here for follow up after completing physical therapy for pain related to macromastia.   She was seen for initial consult by Dr. Marla Roe.  At that time, she complained of chronic back and neck discomfort in the context of large breasts.  Preoperative bra size equals E cup.  STN 31 cm each side.  Estimated excess breast tissue removed at time of surgery equals 450 g each side.  Today, patient is doing well.  She completed 4 weeks of PT, but had to postpone 2 of her sessions as her husband was hospitalized and she needed to assist with his care.  She will finish her PT by 10/28/2021.  She has not yet seen any considerable improvement in her daily chronic neck and upper back discomfort.  She also complains of shoulder grooving, intermittent inframammary rashes, and activities hindered by large breast size.   Review of Systems General: Denies fevers MSK: Endorses ongoing back and neck discomfort Skin: Denies rashes  Physical Exam    08/14/2021    8:29 AM 08/09/2021    1:58 PM 03/25/2021    8:24 AM  Vitals with BMI  Height 5\' 9"  5\' 9"  5\' 8"   Weight 220 lbs 216 lbs 13 oz 211 lbs  BMI 0000000 32 [id number]  Systolic 0000000 [id number]  Diastolic 88 88 82  Pulse 86 74 97    General:  No acute distress,  Alert and oriented, Non-Toxic, Normal speech and affect Psych: Normal behavior and mood Respiratory: No increased WOB MSK: Ambulatory  Assessment/Plan  Patient is interested in pursuing surgical intervention for bilateral breast reduction. Patient has completed at least 4 weeks of physical therapy related to symptomatic macromastia.  Instructed patient to complete the entire 6-week course to see if there is any significant clinical improvement before having  telephone visit for reevaluation.  If at that point she would still like to proceed with surgical intervention via breast reduction surgery, we will pass along information to surgical coordinator and proceed from there.   Krista Blue 10/18/2021, 1:41 PM

## 2021-10-15 NOTE — Therapy (Signed)
OUTPATIENT PHYSICAL THERAPY TREATMENT NOTE   Patient Name: Elizabeth Franklin MRN: [id number] DOB:10-08-1980, 41 y.o., female Today's Date: 10/15/2021  END OF SESSION:   PT End of Session - 10/15/21 1604     Visit Number 4    Number of Visits 6    Date for PT Re-Evaluation 10/28/21    Progress Note Due on Visit 10    PT Start Time 1604    PT Stop Time 1642    PT Time Calculation (min) 38 min             Past Medical History:  Diagnosis Date   Acute maxillary sinusitis 08/02/2020   Anxiety    Bilateral posterior neck pain 07/19/2020   Body mass index (BMI) 34.0-34.9, adult 07/19/2020   Cephalalgia 08/01/2020   Chronic headaches    Dermatitis 08/30/2020   Encounter for medical examination to establish care 07/19/2020   Excessive or frequent menstruation 11/21/2013   Fall down stairs 10/26/2020   Laboratory tests ordered as part of a complete physical exam (CPE) 07/19/2020   Neck pain with history of cervical spinal surgery 08/01/2020   Screening mammogram for breast cancer 07/19/2020   Slow transit constipation 07/19/2020   Weight gain 07/19/2020   Past Surgical History:  Procedure Laterality Date   APPENDECTOMY     CERVICAL [place] & CURRETTAGE/HYSTROSCOPY WITH THERMACHOICE ABLATION N/A 01/30/2014   Procedure: DILATATION & CURETTAGE/HYSTEROSCOPY WITH THERMACHOICE ABLATION;  Surgeon: Florian Buff, MD;  Location: [facility];  Service: Gynecology;  Laterality: N/A;  Total Ablation Therapy Time 9 minutes 5 seconds; 86-88 deg C   ENDOMETRIAL ABLATION     LAPAROSCOPIC BILATERAL SALPINGECTOMY Bilateral 01/30/2014   Procedure: LAPAROSCOPIC BILATERAL SALPINGECTOMY;  Surgeon: Florian Buff, MD;  Location: [facility];  Service: Gynecology;  Laterality: Bilateral;   Patient Active Problem List   Diagnosis Date Noted   Symptomatic mammary hypertrophy 07/03/2021   Snoring 03/25/2021   Tachycardia, paroxysmal ([place]) 03/25/2021   Generalized anxiety disorder 01/03/2021    Sacral back pain 10/26/2020   Encounter for weight management 08/30/2020   Eczema 08/30/2020   Body mass index (BMI) of 32.0-32.9 in adult 08/30/2020   Cervicogenic headache 08/01/2020   Bilateral posterior neck pain 07/19/2020   Allergy to bee sting 07/19/2020   Cervical radiculopathy, chronic 07/19/2020   Stress incontinence in female 07/19/2020   Dissection of vertebral artery ([place]) 10/25/2018   Dysmenorrhea 11/21/2013    PCP: Orma Render, NP   REFERRING PROVIDER:Dillingham, Loel Lofty, DO   REFERRING DIAG: N62 (ICD-10-CM) - Symptomatic mammary hypertrophy   THERAPY DIAG:  Cervicalgia - Plan: PT plan of care cert/re-cert   Muscle weakness (generalized) - Plan: PT plan of care cert/re-cert   ONSET DATE: since highschool   SUBJECTIVE:  SUBJECTIVE STATEMENT: 10/15/2021 Reports slight increase in tension in her neck but she doesn't know why. States she has been trying to do her stretches. States she didn't take ibuprofen   Eval: States that she has had pain in her neck for years but overall the pain has changed and has worsened. States she has pain with turning her neck and pain is sharp. States she has difficulty curling her hair or if her arms are up overhead for a couple minutes they get tired and her neck starts to hurt.    States that anything heavy on her shoulders bother her as it pulls on her neck and she can't use a regular purse.  States she has tried tons of pillows with minimal relief. Pain radiates down back of shoulders/upper back when it is back. States she used to work out a lot but she can no longer run because of the weight of her chest and can no longer lift weights because of the pain in her neck. She has tried wearing multiple sports bras and compression tank tops but  this does not help significantly.   States that she still walks but feels like her inability to participate in physical activity is negatively effecting her mental health. PERTINENT HISTORY:  neck surgery, headaches   PAIN:  Are you having pain? Yes: NPRS scale: 6/10 Pain location: left upper shoulder Pain description: achy, sharp Aggravating factors: OH motions in arm, neck motions Relieving factors: rest   PRECAUTIONS: None   WEIGHT BEARING RESTRICTIONS No   FALLS:  Has patient fallen in last 6 months? 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   PLOF: Independent   PATIENT GOALS to have less pain, to get back to running and the gym   OBJECTIVE:    DIAGNOSTIC FINDINGS:  No recent imaging       COGNITION: Overall cognitive status: Within functional limits for tasks assessed       POSTURE:  Forward head, rounded shoulders, slumped thoracic posture.   PALPATION: Increased resting tone in bilateral cervical and thoracic paraspinals, traps and posterior shoulder musculatures, tenderness to palpation in these areas with L>R              CERVICAL ROM:    Active ROM A/PROM (deg) 09/16/2021  Flexion 40*  Extension 25*  Right lateral flexion 12*  Left lateral flexion 10*  Right rotation 42*  Left rotation 30*   (Blank rows = not tested) *Pain along center/sides of lower neck               UE Measurements       Upper Extremity Right 09/16/2021 Left 09/16/2021    A/PROM MMT A/PROM MMT  Shoulder Flexion WFL* 4* WFL* 4*  Shoulder Extension          Shoulder Abduction WFL 4* WFL 4*  Shoulder Adduction [facility]   WFL 4*  Shoulder Internal Rotation WFL 4+ WFL 4*  Shoulder External Rotation   4      Elbow Flexion          Elbow Extension          Wrist Flexion          Wrist Extension          Wrist Supination          Wrist Pronation          Wrist Ulnar Deviation  Wrist Radial Deviation           Grip Strength NA   NA                         (Blank rows = not tested)                      * pain on ipsilateral neck  Patient is right handed.   CERVICAL SPECIAL TESTS:  Sprulings painful but negative, distraction =positive relief noted       TODAY'S TREATMENT:  10/15/2021 Therapeutic Exercise:            Aerobic: Supine: neck ROT 2 minutes x2, scapular protraction x2 2 minutes, shoulder flexion with post tilt 2 minutes, shoulder ER B 2 minutes total Standing: shoulder flexion at wall 2 minutes, shoulder ER at wall 2 minute, shoulder taps at wall - too difficult             Seated: neck rolls both directions 2 minutes, shoulder rolls 2 minutes            Standing: Neuromuscular Re-education: Manual Therapy: traction to cervical spine 6 minutes, STM to cervical paraspinals. Therapeutic Activity: Self Care: Trigger Point Dry Needling:  Modalities:        PATIENT EDUCATION:  Education details:on  HEP Person educated: Patient Education method: Explanation, Demonstration, and Handouts Education comprehension: verbalized understanding       HOME EXERCISE PROGRAM: [id number]   ASSESSMENT:   CLINICAL IMPRESSION: 10/15/2021 Slight increase in tension noted on this date. Tolerated all interventions well but continued tightness noted. No increase in pain. Revisited scapular exercises and these were tolerated the best. Will continue with current POC as tolerated.   Eval: Patient is a 41 y.o. female who was seen today for physical therapy evaluation and treatment for neck and upper back pain. Patient presents with ROM limitations, increased resting muscle tone, reduced strength and postural deficits that are negatively effecting patient's current functional status. Patient would greatly benefit from skilled PT to improve overall function and quality of life.      OBJECTIVE IMPAIRMENTS decreased activity tolerance, decreased ROM, decreased strength, improper body mechanics,  postural dysfunction, and pain.    ACTIVITY LIMITATIONS cleaning, community activity, occupation, and working out .    PERSONAL FACTORS Age, Past/current experiences, Time since onset of injury/illness/exacerbation, and 1 comorbidity: - Symptomatic mammary hypertrophy  are also affecting patient's functional outcome.      REHAB POTENTIAL: Good   CLINICAL DECISION MAKING: Stable/uncomplicated   EVALUATION COMPLEXITY: Low     GOALS: Goals reviewed with patient?  yes   SHORT TERM GOALS:   Patient will be independent in self management strategies to improve quality of life and functional outcomes. Baseline: new program Target date: 10/07/2021 Goal status: INITIAL   2.  Patient will report at least 50% improvement in overall symptoms and/or function to demonstrate improved functional mobility Baseline: 0% Target date: 10/07/2021 Goal status: INITIAL   3.  Patient will be able to demonstrate at least 45 degrees of cervical rotation to improve ability to check blind spots while driving Baseline: see above Target date: 10/07/2021 Goal status: INITIAL       LONG TERM GOALS:   Patient will report at least 75% improvement in overall symptoms and/or function to demonstrate improved functional mobility Baseline: 0% Target date: 10/28/2021 Goal status: INITIAL   2.  Patient will be able to perform task overhead with  no additional weights for at least 2 minutes to improve ability to use arms overhead without pain in neck Baseline: unable Target date: 10/28/2021 Goal status: INITIAL   3.  Patient will be able to demonstrate at least 4+/5 MMT in bilateral UE to demonstrate improved strength and endurance to perform daily tasks at work. Baseline: see above Target date: 10/28/2021 Goal status: INITIAL       PLAN: PT FREQUENCY: 1x/week   PT DURATION: 6 weeks   PLANNED INTERVENTIONS: Therapeutic exercises, Therapeutic activity, Neuromuscular re-education, Balance training, Gait  training, Patient/Family education, Joint manipulation, Joint mobilization, Dry Needling, Electrical stimulation, Spinal mobilization, Cryotherapy, Moist heat, Ionotophoresis 4mg /ml Dexamethasone, and Manual therapy   PLAN FOR NEXT SESSION: posture, cervical traction, STM gentle as indicated    4:45 PM, 10/15/21 Jerene Pitch, DPT Physical Therapy with Royston Sinner

## 2021-10-18 ENCOUNTER — Ambulatory Visit (INDEPENDENT_AMBULATORY_CARE_PROVIDER_SITE_OTHER): Payer: No Typology Code available for payment source | Admitting: Physician Assistant

## 2021-10-18 DIAGNOSIS — N62 Hypertrophy of breast: Secondary | ICD-10-CM

## 2021-10-22 ENCOUNTER — Ambulatory Visit: Payer: No Typology Code available for payment source | Admitting: Physical Therapy

## 2021-10-22 ENCOUNTER — Encounter: Payer: Self-pay | Admitting: Physical Therapy

## 2021-10-22 DIAGNOSIS — M6281 Muscle weakness (generalized): Secondary | ICD-10-CM

## 2021-10-22 DIAGNOSIS — M542 Cervicalgia: Secondary | ICD-10-CM

## 2021-10-22 NOTE — Therapy (Signed)
OUTPATIENT PHYSICAL THERAPY TREATMENT NOTE   Patient Name: Elizabeth Franklin MRN: [id number] DOB:1981-01-22, 41 y.o., female Today's Date: 10/22/2021  END OF SESSION:   PT End of Session - 10/22/21 1614     Visit Number 5    Number of Visits 6    Date for PT Re-Evaluation 10/28/21    Progress Note Due on Visit 10    PT Start Time 1606    PT Stop Time 1644    PT Time Calculation (min) 38 min              Past Medical History:  Diagnosis Date   Acute maxillary sinusitis 08/02/2020   Anxiety    Bilateral posterior neck pain 07/19/2020   Body mass index (BMI) 34.0-34.9, adult 07/19/2020   Cephalalgia 08/01/2020   Chronic headaches    Dermatitis 08/30/2020   Encounter for medical examination to establish care 07/19/2020   Excessive or frequent menstruation 11/21/2013   Fall down stairs 10/26/2020   Laboratory tests ordered as part of a complete physical exam (CPE) 07/19/2020   Neck pain with history of cervical spinal surgery 08/01/2020   Screening mammogram for breast cancer 07/19/2020   Slow transit constipation 07/19/2020   Weight gain 07/19/2020   Past Surgical History:  Procedure Laterality Date   APPENDECTOMY     CERVICAL DISC SURGERY     DILITATION & CURRETTAGE/HYSTROSCOPY WITH THERMACHOICE ABLATION N/A 01/30/2014   Procedure: DILATATION & CURETTAGE/HYSTEROSCOPY WITH THERMACHOICE ABLATION;  Surgeon: Lazaro Arms, MD;  Location: [facility];  Service: Gynecology;  Laterality: N/A;  Total Ablation Therapy Time 9 minutes 5 seconds; 86-88 deg C   ENDOMETRIAL ABLATION     LAPAROSCOPIC BILATERAL SALPINGECTOMY Bilateral 01/30/2014   Procedure: LAPAROSCOPIC BILATERAL SALPINGECTOMY;  Surgeon: Lazaro Arms, MD;  Location: [facility];  Service: Gynecology;  Laterality: Bilateral;   Patient Active Problem List   Diagnosis Date Noted   Symptomatic mammary hypertrophy 07/03/2021   Snoring 03/25/2021   Tachycardia, paroxysmal (HCC) 03/25/2021   Generalized anxiety disorder 01/03/2021   Sacral  back pain 10/26/2020   Encounter for weight management 08/30/2020   Eczema 08/30/2020   Body mass index (BMI) of 32.0-32.9 in adult 08/30/2020   Cervicogenic headache 08/01/2020   Bilateral posterior neck pain 07/19/2020   Allergy to bee sting 07/19/2020   Cervical radiculopathy, chronic 07/19/2020   Stress incontinence in female 07/19/2020   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 Did yoga again on Sunday which helped a little bit but pain is interfering with sleep and she feels not so good. Laying down and turning her head and her tennis ball exercise seemed to helped.   Eval: States that she has had pain in her neck for years but overall the pain has changed and has worsened. States she has pain with turning her neck and pain is sharp. States she has difficulty curling her hair or if her arms are up overhead for a couple minutes they get tired and her neck starts to hurt.    States that anything heavy on her shoulders bother her as it pulls on her neck and she can't use a regular purse.  States she has tried tons of pillows with minimal relief. Pain radiates down back of shoulders/upper back when it is back. States she used to work out a lot but she can no longer run because of the weight of her chest and can no longer lift weights because of the pain in her  neck. She has tried wearing multiple sports bras and compression tank tops but this does not help significantly.   States that she still walks but feels like her inability to participate in physical activity is negatively effecting her mental health. PERTINENT HISTORY:  neck surgery, headaches   PAIN:  Are you having pain? Yes: NPRS scale: 6/10 Pain location: left upper shoulder Pain description: achy, sharp Aggravating factors: OH motions in arm, neck motions Relieving factors: rest   PRECAUTIONS: None   WEIGHT BEARING RESTRICTIONS No   FALLS:  Has patient fallen in last 6 months? 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   PLOF: Independent   PATIENT GOALS to have less pain, to get back to running and the gym   OBJECTIVE:    DIAGNOSTIC FINDINGS:  No recent imaging       COGNITION: Overall cognitive status: Within functional limits for tasks assessed       POSTURE:  Forward head, rounded shoulders, slumped thoracic posture.   PALPATION: Increased resting tone in bilateral cervical and thoracic paraspinals, traps and posterior shoulder musculatures, tenderness to palpation in these areas with L>R              CERVICAL ROM:    Active ROM A/PROM (deg) 09/16/2021  Flexion 40*  Extension 25*  Right lateral flexion 12*  Left lateral flexion 10*  Right rotation 42*  Left rotation 30*   (Blank rows = not tested) *Pain along center/sides of lower neck               UE Measurements       Upper Extremity Right 09/16/2021 Left 09/16/2021    A/PROM MMT A/PROM MMT  Shoulder Flexion WFL* 4* WFL* 4*  Shoulder Extension          Shoulder Abduction WFL 4* WFL 4*  Shoulder Adduction [facility]   WFL 4*  Shoulder Internal Rotation WFL 4+ WFL 4*  Shoulder External Rotation   4      Elbow Flexion          Elbow Extension          Wrist Flexion          Wrist Extension          Wrist Supination  Wrist Pronation          Wrist Ulnar Deviation          Wrist Radial Deviation          Grip Strength NA   NA                         (Blank rows = not tested)                      * pain on ipsilateral neck  Patient is right handed.   CERVICAL SPECIAL TESTS:  Sprulings painful but negative, distraction =positive relief noted       TODAY'S TREATMENT:  10/22/2021 Therapeutic Exercise:            Aerobic: Supine: towel roll down spine 5 minutes then with scap squeezes 2 minutes, then with bear hug rolling 2 minutes, horizontal shoulder abd on towel roll 2 minutes, neck ROT 2 minutes, chin tucks 2 minutes on foam roller.neck mobilization on towel roll 2 minutes Seated - posture with towel roll down back - 2 minutes         PATIENT EDUCATION:  Education details:on  HEP Person educated: Patient Education method: Explanation, Facilities managerDemonstration, and Handouts Education comprehension: verbalized understanding       HOME EXERCISE PROGRAM: [id number]   ASSESSMENT:   CLINICAL IMPRESSION: 10/22/2021 Session focused on thoracic mobility with towel roll. Tolerated this very well with reduced symptoms noted afterwards. Educated patient in use of towel with sitting in car and use of towel at home for exercises. Will continue with current POC as tolerated   Eval: Patient is a 41 y.o. female who was seen today for physical therapy evaluation and treatment for neck and upper back pain. Patient presents with ROM limitations, increased resting muscle tone, reduced strength and postural deficits that are negatively effecting patient's current functional status. Patient would greatly benefit from skilled PT to improve overall function and quality of life.      OBJECTIVE IMPAIRMENTS decreased activity tolerance, decreased ROM, decreased strength, improper body mechanics, postural dysfunction, and pain.    ACTIVITY LIMITATIONS cleaning, community activity, occupation, and working out .    PERSONAL  FACTORS Age, Past/current experiences, Time since onset of injury/illness/exacerbation, and 1 comorbidity: - Symptomatic mammary hypertrophy  are also affecting patient's functional outcome.      REHAB POTENTIAL: Good   CLINICAL DECISION MAKING: Stable/uncomplicated   EVALUATION COMPLEXITY: Low     GOALS: Goals reviewed with patient?  yes   SHORT TERM GOALS:   Patient will be independent in self management strategies to improve quality of life and functional outcomes. Baseline: new program Target date: 10/07/2021 Goal status: INITIAL   2.  Patient will report at least 50% improvement in overall symptoms and/or function to demonstrate improved functional mobility Baseline: 0% Target date: 10/07/2021 Goal status: INITIAL   3.  Patient will be able to demonstrate at least 45 degrees of cervical rotation to improve ability to check blind spots while driving Baseline: see above Target date: 10/07/2021 Goal status: INITIAL       LONG TERM GOALS:   Patient will report at least 75% improvement in overall symptoms and/or function to demonstrate improved functional mobility Baseline: 0% Target date: 10/28/2021 Goal status: INITIAL   2.  Patient will be able to perform task overhead with no additional weights for at least 2 minutes to improve ability to use arms overhead without pain in  neck Baseline: unable Target date: 10/28/2021 Goal status: INITIAL   3.  Patient will be able to demonstrate at least 4+/5 MMT in bilateral UE to demonstrate improved strength and endurance to perform daily tasks at work. Baseline: see above Target date: 10/28/2021 Goal status: INITIAL       PLAN: PT FREQUENCY: 1x/week   PT DURATION: 6 weeks   PLANNED INTERVENTIONS: Therapeutic exercises, Therapeutic activity, Neuromuscular re-education, Balance training, Gait training, Patient/Family education, Joint manipulation, Joint mobilization, Dry Needling, Electrical stimulation, Spinal mobilization,  Cryotherapy, Moist heat, Ionotophoresis 4mg /ml Dexamethasone, and Manual therapy   PLAN FOR NEXT SESSION: posture, cervical traction, STM gentle as indicated    4:44 PM, 10/22/21 10/24/21, DPT Physical Therapy with [facility]

## 2021-10-24 ENCOUNTER — Ambulatory Visit: Payer: No Typology Code available for payment source | Admitting: Physical Therapy

## 2021-10-24 ENCOUNTER — Encounter: Payer: Self-pay | Admitting: Physical Therapy

## 2021-10-24 DIAGNOSIS — M6281 Muscle weakness (generalized): Secondary | ICD-10-CM

## 2021-10-24 DIAGNOSIS — M542 Cervicalgia: Secondary | ICD-10-CM

## 2021-10-24 NOTE — Therapy (Signed)
OUTPATIENT PHYSICAL THERAPY TREATMENT NOTE and Progress Note, Discharge Note    Progress Note Reporting Period 09/16/21 to 10/24/21  See note below for Objective Data and Assessment of Progress/Goals.    PHYSICAL THERAPY DISCHARGE SUMMARY  Visits from Start of Care: 6   Current functional level related to goals / functional outcomes: 2/3 short term goals met and no long term goals met    Remaining deficits: Continued pain, ROM deficits, strength deficits and difficulties with daily tasks like driving and putting hair up   Education / Equipment: Review of pain management strategies, HEP and current presentation.    Patient agrees to discharge. Patient goals were not met. Patient is being discharged due to lack of progress.   Patient Name: Elizabeth Franklin MRN: [id number] DOB:[birth year], 41 y.o., female Today's Date: 10/24/2021  END OF SESSION:   PT End of Session - 10/24/21 1559     Visit Number 6    Number of Visits 6    Date for PT Re-Evaluation 10/28/21    Progress Note Due on Visit 10    PT Start Time 1601    PT Stop Time 1625    PT Time Calculation (min) 24 min              Past Medical History:  Diagnosis Date   Acute maxillary sinusitis 08/02/2020   Anxiety    Bilateral posterior neck pain 07/19/2020   Body mass index (BMI) 34.0-34.9, adult 07/19/2020   Cephalalgia 08/01/2020   Chronic headaches    Dermatitis 08/30/2020   Encounter for medical examination to establish care 07/19/2020   Excessive or frequent menstruation 11/21/2013   Fall down stairs 10/26/2020   Laboratory tests ordered as part of a complete physical exam (CPE) 07/19/2020   Neck pain with history of cervical spinal surgery 08/01/2020   Screening mammogram for breast cancer 07/19/2020   Slow transit constipation 07/19/2020   Weight gain 07/19/2020   Past Surgical History:  Procedure Laterality Date   APPENDECTOMY     CERVICAL [place] & CURRETTAGE/HYSTROSCOPY WITH  THERMACHOICE ABLATION N/A 01/30/2014   Procedure: DILATATION & CURETTAGE/HYSTEROSCOPY WITH THERMACHOICE ABLATION;  Surgeon: Florian Buff, MD;  Location: [facility];  Service: Gynecology;  Laterality: N/A;  Total Ablation Therapy Time 9 minutes 5 seconds; 86-88 deg C   ENDOMETRIAL ABLATION     LAPAROSCOPIC BILATERAL SALPINGECTOMY Bilateral 01/30/2014   Procedure: LAPAROSCOPIC BILATERAL SALPINGECTOMY;  Surgeon: Florian Buff, MD;  Location: [facility];  Service: Gynecology;  Laterality: Bilateral;   Patient Active Problem List   Diagnosis Date Noted   Symptomatic mammary hypertrophy 07/03/2021   Snoring 03/25/2021   Tachycardia, paroxysmal ([place]) 03/25/2021   Generalized anxiety disorder 01/03/2021   Sacral back pain 10/26/2020   Encounter for weight management 08/30/2020   Eczema 08/30/2020   Body mass index (BMI) of 32.0-32.9 in adult 08/30/2020   Cervicogenic headache 08/01/2020   Bilateral posterior neck pain 07/19/2020   Allergy to bee sting 07/19/2020   Cervical radiculopathy, chronic 07/19/2020   Stress incontinence in female 07/19/2020   Dissection of vertebral artery ([place]) 10/25/2018   Dysmenorrhea 11/21/2013    PCP: Orma Render, NP   REFERRING PROVIDER:Dillingham, Loel Lofty, DO   REFERRING DIAG: N62 (ICD-10-CM) - Symptomatic mammary hypertrophy   THERAPY DIAG:  Cervicalgia - Plan: PT plan of care cert/re-cert   Muscle weakness (generalized) - Plan: PT plan of care cert/re-cert   ONSET DATE: since highschool   SUBJECTIVE:  SUBJECTIVE STATEMENT: 10/24/2021 Felt good after leaving last session but then the next day everything returned. Reports she has pain and tingling on both sides of her neck. State she did sit in her desk a bit more today. States she does her exercises and they  help in the moment but then it gets worse. States her exercises help her relax to go to sleep but then she wakes up in pain. States that she doesn't have to take as much ibuprofen during the day but still in pain. Reports after her exercises she feels about 75% but this is short lived, on a whole she feels 50% better because there is some relief to the point that the pain is manageable but then pain returns and she has to do her exercises again.   PERTINENT HISTORY:  neck surgery, headaches   PAIN:  Are you having pain? Yes: NPRS scale: 7/10 Pain location: both shoulder Pain description: tingling, dull, constant Aggravating factors: OH motions in arm, neck motions Relieving factors: rest    OBJECTIVE:    CERVICAL ROM:    Active ROM A/PROM (deg) 09/16/2021 A/PROM (deg) 10/24/2021  Flexion 40* 40**  Extension 25* 30  Right lateral flexion 12* 10**  Left lateral flexion 10* 8**  Right rotation 42* 40**  Left rotation 30* 30**   (Blank rows = not tested) *Pain along center/sides of lower neck Pain along center/sides of lower neck and into the shoulder blades               UE Measurements       Upper Extremity Right 10/24/2021 Left 10/24/2021    A/PROM MMT A/PROM MMT  Shoulder Flexion WFL* 4* WFL* 4*  Shoulder Extension          Shoulder Abduction WFL - tight 4 WFL-tight  4*  Shoulder Adduction       Shoulder Internal Rotation WFL* 4+ WFL 4+  Shoulder External Rotation  WFL 4*  WFL  4*  Elbow Flexion          Elbow Extension          Wrist Flexion          Wrist Extension          Wrist Supination          Wrist Pronation          Wrist Ulnar Deviation          Wrist Radial Deviation          Grip Strength NA   NA                         (Blank rows = not tested)                      * pain on ipsilateral neck         TODAY'S TREATMENT:  10/24/2021 Therapeutic Exercise:            Aerobic: Supine:towel roll laying on for  6 minutes, cervical ROT in supine - x25 10"  holds B        PATIENT EDUCATION:  Education details:on  HEP Person educated: Patient Education method: Explanation, Media planner, and Handouts Education comprehension: verbalized understanding       HOME EXERCISE PROGRAM: [id number]   ASSESSMENT:   CLINICAL IMPRESSION: 10/24/2021 Overall patient independent in home program and pain managements strategies but continues to have pain daily. Pain limits her ability  to get ready in the morning, perform daily tasks around the home and driving. At time, pain is so bad she has difficulties checking blind spots while driving. Answered all questions about current presentation and instructed patient to follow up with MD secondary to continued pain and a lack of significant progress while in therapy in regards to long term pain relief.     OBJECTIVE IMPAIRMENTS decreased activity tolerance, decreased ROM, decreased strength, improper body mechanics, postural dysfunction, and pain.    ACTIVITY LIMITATIONS cleaning, community activity, occupation, and working out .    PERSONAL FACTORS Age, Past/current experiences, Time since onset of injury/illness/exacerbation, and 1 comorbidity: - Symptomatic mammary hypertrophy  are also affecting patient's functional outcome.      REHAB POTENTIAL: Good   CLINICAL DECISION MAKING: Stable/uncomplicated   EVALUATION COMPLEXITY: Low     GOALS: Goals reviewed with patient?  yes   SHORT TERM GOALS:   Patient will be independent in self management strategies to improve quality of life and functional outcomes. Baseline: new program Target date: 10/07/2021 Goal status: MET   2.  Patient will report at least 50% improvement in overall symptoms and/or function to demonstrate improved functional mobility Baseline: 0% Target date: 10/07/2021 Goal status: MET   3.  Patient will be able to demonstrate at least 45 degrees of cervical rotation to improve ability to check blind spots while driving Baseline:  see above Target date: 10/07/2021 Goal status: NOT MET        LONG TERM GOALS:   Patient will report at least 75% improvement in overall symptoms and/or function to demonstrate improved functional mobility Baseline: 0% Target date: 10/28/2021 Goal status: NOT MET    2.  Patient will be able to perform task overhead with no additional weights for at least 2 minutes to improve ability to use arms overhead without pain in neck Baseline: unable Target date: 10/28/2021 Goal status:  NOT MET - still difficulties with basic overhead reaching like putting hair in ponytail   3.  Patient will be able to demonstrate at least 4+/5 MMT in bilateral UE to demonstrate improved strength and endurance to perform daily tasks at work. Baseline: see above Target date: 10/28/2021 Goal status:  NOT MET        PLAN: PT FREQUENCY: 1x/week   PT DURATION: 6 weeks   PLANNED INTERVENTIONS: Therapeutic exercises, Therapeutic activity, Neuromuscular re-education, Balance training, Gait training, Patient/Family education, Joint manipulation, Joint mobilization, Dry Needling, Electrical stimulation, Spinal mobilization, Cryotherapy, Moist heat, Ionotophoresis 65m/ml Dexamethasone, and Manual therapy   PLAN FOR NEXT SESSION: DC to HEP    4:25 PM, 10/24/21 MJerene Pitch DPT Physical Therapy with CRoyston Sinner

## 2021-10-28 ENCOUNTER — Encounter: Payer: No Typology Code available for payment source | Admitting: Physical Therapy

## 2021-11-04 ENCOUNTER — Other Ambulatory Visit (HOSPITAL_BASED_OUTPATIENT_CLINIC_OR_DEPARTMENT_OTHER): Payer: Self-pay

## 2021-11-05 NOTE — Progress Notes (Signed)
   Referring Provider Early, Sung Amabile, NP [address] [place],  [place] [address]   CC:  Chief Complaint  Patient presents with   Follow-up      Elizabeth Franklin is an 41 y.o. female.  HPI: Patient is a 41 y.o. year old female here for follow up after completing physical therapy for pain related to macromastia.   The patient, Elizabeth Franklin, gave permission to have this encounter performed via telemedicine, two identifiers were used to confirm patient's identity.  They also consented to chart review and treatment via this encounter.  She was seen for initial consult by Dr. Ulice Bold.  At that time, she complained of chronic back and neck discomfort in the context of large breasts.  Preoperative bra size equals E cup.  STN 31 cm each side. Estimated excess breast tissue removed at time of surgery equals 450 g each side.  Today, patient states that she has completed her physical therapy visits.  She has been given good tips for recovery and feels as though some of the exercises help temporarily alleviate her discomfort.  However, she still continues to endorse daily upper back and neck discomfort.  She just had to obtain medicated powder for inframammary rashes given the heat.  She continues to endorse shoulder grooves and activities hindered by her large breasts.  She would still very much like to proceed with surgical intervention.   Review of Systems General: Denies fevers or chills MSK: Endorses ongoing back and neck discomfort Skin: Endorses inframammary rashes  Physical Exam    08/14/2021    8:29 AM 08/09/2021    1:58 PM 03/25/2021    8:24 AM  Vitals with BMI  Height 5\' 9"  5\' 9"  5\' 8"   Weight 220 lbs 216 lbs 13 oz 211 lbs  BMI 32.47 32 32.09  Systolic 128 149  Diastolic 88 88 82  Pulse 86 74 97    Telephone exam.  Assessment/Plan  Patient is interested in pursuing surgical intervention for bilateral breast reduction. Patient has completed at least 6  weeks of physical therapy for pain related to macromastia.  Discussed with patient we would submit to insurance for authorization, discussed approval could take up to 6 weeks.   11/08/2021, 2:02 PM

## 2021-11-06 ENCOUNTER — Telehealth: Payer: Self-pay | Admitting: Plastic Surgery

## 2021-11-06 NOTE — Telephone Encounter (Signed)
Clinicals faxed to [facility] at [phone]. Authorization began for breast reduction surgery

## 2021-11-07 DIAGNOSIS — Z719 Counseling, unspecified: Secondary | ICD-10-CM

## 2021-11-08 ENCOUNTER — Ambulatory Visit (INDEPENDENT_AMBULATORY_CARE_PROVIDER_SITE_OTHER): Payer: Self-pay | Admitting: Physician Assistant

## 2021-11-08 DIAGNOSIS — N62 Hypertrophy of breast: Secondary | ICD-10-CM

## 2021-11-20 ENCOUNTER — Other Ambulatory Visit (HOSPITAL_BASED_OUTPATIENT_CLINIC_OR_DEPARTMENT_OTHER): Payer: Self-pay | Admitting: Nurse Practitioner

## 2021-11-20 ENCOUNTER — Other Ambulatory Visit (HOSPITAL_BASED_OUTPATIENT_CLINIC_OR_DEPARTMENT_OTHER): Payer: Self-pay

## 2021-11-20 DIAGNOSIS — M542 Cervicalgia: Secondary | ICD-10-CM

## 2021-11-20 DIAGNOSIS — M5412 Radiculopathy, cervical region: Secondary | ICD-10-CM

## 2021-11-21 ENCOUNTER — Other Ambulatory Visit (HOSPITAL_BASED_OUTPATIENT_CLINIC_OR_DEPARTMENT_OTHER): Payer: Self-pay | Admitting: Nurse Practitioner

## 2021-11-21 ENCOUNTER — Other Ambulatory Visit (HOSPITAL_BASED_OUTPATIENT_CLINIC_OR_DEPARTMENT_OTHER): Payer: Self-pay

## 2021-11-21 DIAGNOSIS — M5412 Radiculopathy, cervical region: Secondary | ICD-10-CM

## 2021-11-21 DIAGNOSIS — M542 Cervicalgia: Secondary | ICD-10-CM

## 2021-11-21 MED ORDER — GABAPENTIN 100 MG PO CAPS
100.0000 mg | ORAL_CAPSULE | Freq: Three times a day (TID) | ORAL | 3 refills | Status: AC | PRN
Start: 2021-11-21 — End: ?
  Filled 2021-11-21: qty 270, 90d supply, fill #0

## 2021-11-22 ENCOUNTER — Other Ambulatory Visit (HOSPITAL_BASED_OUTPATIENT_CLINIC_OR_DEPARTMENT_OTHER): Payer: Self-pay

## 2022-02-04 ENCOUNTER — Encounter: Payer: No Typology Code available for payment source | Admitting: Physician Assistant

## 2022-02-05 ENCOUNTER — Encounter: Payer: No Typology Code available for payment source | Admitting: Student

## 2022-02-14 ENCOUNTER — Other Ambulatory Visit (HOSPITAL_BASED_OUTPATIENT_CLINIC_OR_DEPARTMENT_OTHER): Payer: Self-pay | Admitting: Nurse Practitioner

## 2022-02-14 ENCOUNTER — Ambulatory Visit (HOSPITAL_BASED_OUTPATIENT_CLINIC_OR_DEPARTMENT_OTHER)
Admission: RE | Admit: 2022-02-14 | Discharge: 2022-02-14 | Disposition: A | Discharge status: Home / Self Care | Payer: No Typology Code available for payment source | Source: Ambulatory Visit | Attending: Nurse Practitioner | Admitting: Nurse Practitioner

## 2022-02-14 DIAGNOSIS — Z1239 Encounter for other screening for malignant neoplasm of breast: Secondary | ICD-10-CM

## 2022-02-14 DIAGNOSIS — Z1231 Encounter for screening mammogram for malignant neoplasm of breast: Secondary | ICD-10-CM | POA: Diagnosis present

## 2022-02-17 ENCOUNTER — Other Ambulatory Visit (HOSPITAL_BASED_OUTPATIENT_CLINIC_OR_DEPARTMENT_OTHER): Payer: Self-pay

## 2022-02-17 ENCOUNTER — Ambulatory Visit (INDEPENDENT_AMBULATORY_CARE_PROVIDER_SITE_OTHER): Payer: No Typology Code available for payment source | Admitting: Student

## 2022-02-17 VITALS — BP 119/86 | HR 100 | Ht 68.0 in | Wt 214.0 lb

## 2022-02-17 DIAGNOSIS — M542 Cervicalgia: Secondary | ICD-10-CM

## 2022-02-17 DIAGNOSIS — N62 Hypertrophy of breast: Secondary | ICD-10-CM

## 2022-02-17 MED ORDER — CEPHALEXIN 500 MG PO CAPS
500.0000 mg | ORAL_CAPSULE | Freq: Four times a day (QID) | ORAL | 0 refills | Status: AC
  Filled 2022-02-17: qty 12, 3d supply, fill #0

## 2022-02-17 MED ORDER — ONDANSETRON 4 MG PO TBDP
4.0000 mg | ORAL_TABLET | Freq: Three times a day (TID) | ORAL | 0 refills | Status: AC | PRN
  Filled 2022-02-17: qty 20, 7d supply, fill #0

## 2022-02-17 MED ORDER — TRAMADOL HCL 50 MG PO TABS
50.0000 mg | ORAL_TABLET | Freq: Three times a day (TID) | ORAL | 0 refills | Status: DC | PRN
  Filled 2022-02-17: qty 20, 7d supply, fill #0

## 2022-02-17 NOTE — H&P (View-Only) (Signed)
Patient ID: Elizabeth Franklin, female    DOB: [birth year], 40 y.o.   MRN: [id number]  Chief Complaint  Patient presents with   Pre-op Exam      ICD-10-CM   1. Symptomatic mammary hypertrophy  N62     2. Large breasts  N62     3. Bilateral posterior neck pain  M54.2        History of Present Illness: Elizabeth Franklin is a 41 y.o.  female  with a history of macromastia.  She presents for preoperative evaluation for upcoming procedure, Bilateral Breast Reduction with possible liposuction, scheduled for 02/27/22 with Dr.  Marla Roe  Patient reports that she has had anesthesia before.  She states that she gets some postoperative nausea and vomiting.  Patient reports family history of breast cancer in her aunt and her grandmother.  She denies any personal family history of breast cancer.  She denies any history of cardiac disease.  She denies taking any blood thinners.  Patient reports she is not a smoker.  Patient denies any use of birth control or hormone replacement.  She denies any history of miscarriages.  She denies any personal or family history of blood clots or clotting diseases.  She denies any recent traumas, surgeries, infections, pregnancies, strokes or heart attacks.  She denies any history of inflammatory bowel disease, lung disease or cancer.  Patient denies any fevers, chills, shortness of breath.  She denies any changes in her health.  Patient reports her preoperative bra size is an E cup.  She states that she would like to be a small D cup.  I discussed with the patient the limitations of surgery and acquiring certain cup sizes.    Summary of Previous Visit: Patient was seen in the clinic on 08/09/21 by Dr. Marla Roe for consult for breast reduction. Patient complained of mammary hypertrophy for several years and complained of symptoms including neck pain, back pain and shoulder strap grooving. Patient was found to have STN of 31 cm bilaterally. Her BMI was 22.9. Patient's  preoperative bra size was an E cup.   Patient underwent physical therapy without relief of her symptoms. Patient still expressed the interest in surgical intervention.   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.  She plans to take 3 weeks off of work.  I discussed with the patient she can send [place] FMLA paperwork if she needs [place] to fill out.  PMH Significant for: Neck pain, generalized anxiety disorder, mammary hypertrophy    Past Medical History: Allergies: Allergies  Allergen Reactions   Bee Venom Swelling   Codeine Nausea And Vomiting    Current Medications:  Current Outpatient Medications:    betamethasone dipropionate (DIPROLENE) 0.05 % ointment, Apply topically 2 (two) times daily. To eczema rash., Disp: 45 g, Rfl: 3   cephALEXin (KEFLEX) 500 MG capsule, Take 1 capsule (500 mg total) by mouth 4 (four) times daily for 3 days., Disp: 12 capsule, Rfl: 0   COVID-19 At Home Antigen Test KIT, Use as directed, Disp: 2 kit, Rfl: 0   cyclobenzaprine (FLEXERIL) 10 MG tablet, Take 1 tablet (10 mg total) by mouth at bedtime as needed for muscle spasms., Disp: 90 tablet, Rfl: 1   EPINEPHrine 0.3 mg/0.3 mL IJ SOAJ injection, Inject 0.3 mg into the muscle as needed for anaphylaxis., Disp: 0.6 mL, Rfl: 1   gabapentin (NEURONTIN) 100  MG capsule, Take 1 capsule (100 mg total) by mouth 3 (three) times daily as needed., Disp: 270 capsule, Rfl: 3   ondansetron (ZOFRAN-ODT) 4 MG disintegrating tablet, Take 1 tablet (4 mg total) by mouth every 8 (eight) hours as needed for nausea or vomiting., Disp: 30 tablet, Rfl: 1   ondansetron (ZOFRAN-ODT) 4 MG disintegrating tablet, Take 1 tablet (4 mg total) by mouth every 8 (eight) hours as needed for up to 20 doses for nausea or vomiting., Disp: 20 tablet, Rfl: 0   rizatriptan (MAXALT) 10 MG tablet, Take 1 tablet (10 mg total) by mouth as needed for  migraine. May repeat in 2 hours if needed, Disp: 18 tablet, Rfl: 4   traMADol (ULTRAM) 50 MG tablet, Take 1 tablet (50 mg total) by mouth every 8 (eight) hours as needed for up to 20 doses., Disp: 20 tablet, Rfl: 0  Past Medical Problems: Past Medical History:  Diagnosis Date   Acute maxillary sinusitis 08/02/2020   Anxiety    Bilateral posterior neck pain 07/19/2020   Body mass index (BMI) 34.0-34.9, adult 07/19/2020   Cephalalgia 08/01/2020   Chronic headaches    Dermatitis 08/30/2020   Encounter for medical examination to establish care 07/19/2020   Excessive or frequent menstruation 11/21/2013   Fall down stairs 10/26/2020   Laboratory tests ordered as part of a complete physical exam (CPE) 07/19/2020   Neck pain with history of cervical spinal surgery 08/01/2020   Screening mammogram for breast cancer 07/19/2020   Slow transit constipation 07/19/2020   Weight gain 07/19/2020    Past Surgical History: Past Surgical History:  Procedure Laterality Date   APPENDECTOMY     CERVICAL [place] & CURRETTAGE/HYSTROSCOPY WITH THERMACHOICE ABLATION N/A 01/30/2014   Procedure: DILATATION & CURETTAGE/HYSTEROSCOPY WITH THERMACHOICE ABLATION;  Surgeon: Florian Buff, MD;  Location: [facility];  Service: Gynecology;  Laterality: N/A;  Total Ablation Therapy Time 9 minutes 5 seconds; 86-88 deg C   ENDOMETRIAL ABLATION     LAPAROSCOPIC BILATERAL SALPINGECTOMY Bilateral 01/30/2014   Procedure: LAPAROSCOPIC BILATERAL SALPINGECTOMY;  Surgeon: Florian Buff, MD;  Location: [facility];  Service: Gynecology;  Laterality: Bilateral;    Social History: Social History   Socioeconomic History   Marital status: Married    Spouse name: Mervin Kung   Number of children: 1   Years of education: Not on file   Highest education level: Not on file  Occupational History   Not on file  Tobacco Use   Smoking status: Former    Packs/day: 0.25    Years: 1.00    Total pack years: 0.25    Types:  Cigarettes    Quit date: 01/26/1999    Years since quitting: 23.0   Smokeless tobacco: Never  Vaping Use   Vaping Use: Never used  Substance and Sexual Activity   Alcohol use: Yes    Comment: occassional   Drug use: No   Sexual activity: Yes    Partners: Male    Birth control/protection: Surgical    Comment: monogomous relationship  Other Topics Concern   Not on file  Social History Narrative   Not on file   Social Determinants of Health   Financial Resource Strain: Not on file  Food Insecurity: Not on file  Transportation Needs: Not on file  Physical Activity: Inactive (07/19/2020)   Exercise Vital Sign    Days of Exercise per Week: 0 days    Minutes of Exercise per Session: 0  min  Stress: Not on file  Social Connections: Not on file  Intimate Partner Violence: Not At Risk (07/19/2020)   Humiliation, Afraid, Rape, and Kick questionnaire    Fear of Current or Ex-Partner: No    Emotionally Abused: No    Physically Abused: No    Sexually Abused: No    Family History: Family History  Problem Relation Age of Onset   Hypertension Mother    Mental illness Mother    Heart failure Father    Heart disease Father    Hypertension Father    Diabetes Brother    Hyperlipidemia Brother    Diabetes Paternal Grandmother    Heart disease Paternal Grandmother    Breast cancer Paternal Grandmother 24   Breast cancer Paternal Aunt 18    Review of Systems: Denies fevers, chills, shortness of breath.  She denies any recent changes in her health.  Physical Exam: Vital Signs BP 119/86 (BP Location: Left Arm, Patient Position: Sitting, Cuff Size: Large)   Pulse 100   Ht '5\' 8"'  (1.727 m)   Wt 214 lb (97.1 kg)   SpO2 97%   BMI 32.54 kg/m   Physical Exam  Constitutional:      General: Not in acute distress.    Appearance: Normal appearance. Not ill-appearing.  HENT:     Head: Normocephalic and atraumatic.  Eyes:     Pupils: Pupils are equal, round Neck:     Musculoskeletal:  Normal range of motion.  Cardiovascular:     Rate and Rhythm: Normal rate Pulmonary:     Effort: Pulmonary effort is normal. No respiratory distress.  Musculoskeletal: Normal range of motion.  Lower Extremities: Nonswollen, no varicose veins noted bilaterally. Skin:    General: Skin is warm and dry.     Findings: No erythema or rash.  Neurological:     Mental Status: Alert and oriented to person, place, and time. Mental status is at baseline.  Psychiatric:        Mood and Affect: Mood normal.        Behavior: Behavior normal.    Assessment/Plan: The patient is scheduled for bilateral breast reduction with Dr. Marla Roe.  Risks, benefits, and alternatives of procedure discussed, questions answered and consent obtained.    Smoking Status: Non-smoker; Counseling Given?  N/A Last Mammogram: 02/14/2022; Results: Mammogram results are not yet available.  Patient states that she will call us if there is any abnormalities to her mammogram.  Caprini Score: 4; Risk Factors include: Age, BMI greater than 25, and length of planned surgery. Recommendation for mechanical prophylaxis. Encourage early ambulation.   Pictures obtained: '@consult'   Post-op Rx sent to pharmacy: Tramadol, Zofran ODT, Keflex  Patient reports she experiences nausea and vomiting when taking oxycodone. We discussed the option of tramadol and patient felt comfortable trying that for postoperative pain medication. Patient also requested zofran odt.   I discussed with the patient that she should hold her ibuprofen 1 week leading up to surgery.  I also discussed with the patient that she should hold her Flexeril, gabapentin and rizatriptan the day of surgery.  Patient expressed understanding.  Patient was provided with the breast reduction and General Surgical Risk consent document and Pain Medication Agreement prior to their appointment.  They had adequate time to read through the risk consent documents and Pain Medication  Agreement. We also discussed them in person together during this preop appointment. All of their questions were answered to their satisfaction.  Recommended calling if they have  any further questions.  Risk consent form and Pain Medication Agreement to be scanned into patient's chart.  The risk that can be encountered with breast reduction were discussed and include the following but not limited to these:  Breast asymmetry, fluid accumulation, firmness of the breast, inability to breast feed, loss of nipple or areola, skin loss, decrease or no nipple sensation, fat necrosis of the breast tissue, bleeding, infection, healing delay.  There are risks of anesthesia, changes to skin sensation and injury to nerves or blood vessels.  The muscle can be temporarily or permanently injured.  You may have an allergic reaction to tape, suture, glue, blood products which can result in skin discoloration, swelling, pain, skin lesions, poor healing.  Any of these can lead to the need for revisonal surgery or stage procedures.  A reduction has potential to interfere with diagnostic procedures.  Nipple or breast piercing can increase risks of infection.  This procedure is best done when the breast is fully developed.  Changes in the breast will continue to occur over time.  Pregnancy can alter the outcomes of previous breast reduction surgery, weight gain and weigh loss can also effect the long term appearance.   The consent was obtained with risks and complications reviewed which included bleeding, pain, scar, infection and the risk of anesthesia.  The patients questions were answered to the patients expressed satisfaction.   Electronically signed by: Clance Boll, PA-C 02/17/2022 4:33 PM

## 2022-02-17 NOTE — Progress Notes (Addendum)
Patient ID: Elizabeth Franklin, female    DOB: Jan 09, 1981, 41 y.o.   MRN: [id number]  Chief Complaint  Patient presents with   Pre-op Exam      ICD-10-CM   1. Symptomatic mammary hypertrophy  N62     2. Large breasts  N62     3. Bilateral posterior neck pain  M54.2        History of Present Illness: Elizabeth Franklin is a 41 y.o.  female  with a history of macromastia.  She presents for preoperative evaluation for upcoming procedure, Bilateral Breast Reduction with possible liposuction, scheduled for 02/27/22 with Dr.  Marla Roe  Patient reports that she has had anesthesia before.  She states that she gets some postoperative nausea and vomiting.  Patient reports family history of breast cancer in her aunt and her grandmother.  She denies any personal family history of breast cancer.  She denies any history of cardiac disease.  She denies taking any blood thinners.  Patient reports she is not a smoker.  Patient denies any use of birth control or hormone replacement.  She denies any history of miscarriages.  She denies any personal or family history of blood clots or clotting diseases.  She denies any recent traumas, surgeries, infections, pregnancies, strokes or heart attacks.  She denies any history of inflammatory bowel disease, lung disease or cancer.  Patient denies any fevers, chills, shortness of breath.  She denies any changes in her health.  Patient reports her preoperative bra size is an E cup.  She states that she would like to be a small D cup.  I discussed with the patient the limitations of surgery and acquiring certain cup sizes.    Summary of Previous Visit: Patient was seen in the clinic on 08/09/21 by Dr. Marla Roe for consult for breast reduction. Patient complained of mammary hypertrophy for several years and complained of symptoms including neck pain, back pain and shoulder strap grooving. Patient was found to have STN of 31 cm bilaterally. Her BMI was 22.9. Patient's  preoperative bra size was an E cup.   Patient underwent physical therapy without relief of her symptoms. Patient still expressed the interest in surgical intervention.   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.  She plans to take 3 weeks off of work.  I discussed with the patient she can send [place] FMLA paperwork if she needs [place] to fill out.  PMH Significant for: Neck pain, generalized anxiety disorder, mammary hypertrophy    Past Medical History: Allergies: Allergies  Allergen Reactions   Bee Venom Swelling   Codeine Nausea And Vomiting    Current Medications:  Current Outpatient Medications:    betamethasone dipropionate (DIPROLENE) 0.05 % ointment, Apply topically 2 (two) times daily. To eczema rash., Disp: 45 g, Rfl: 3   cephALEXin (KEFLEX) 500 MG capsule, Take 1 capsule (500 mg total) by mouth 4 (four) times daily for 3 days., Disp: 12 capsule, Rfl: 0   COVID-19 At Home Antigen Test KIT, Use as directed, Disp: 2 kit, Rfl: 0   cyclobenzaprine (FLEXERIL) 10 MG tablet, Take 1 tablet (10 mg total) by mouth at bedtime as needed for muscle spasms., Disp: 90 tablet, Rfl: 1   EPINEPHrine 0.3 mg/0.3 mL IJ SOAJ injection, Inject 0.3 mg into the muscle as needed for anaphylaxis., Disp: 0.6 mL, Rfl: 1   gabapentin (NEURONTIN) 100  MG capsule, Take 1 capsule (100 mg total) by mouth 3 (three) times daily as needed., Disp: 270 capsule, Rfl: 3   ondansetron (ZOFRAN-ODT) 4 MG disintegrating tablet, Take 1 tablet (4 mg total) by mouth every 8 (eight) hours as needed for nausea or vomiting., Disp: 30 tablet, Rfl: 1   ondansetron (ZOFRAN-ODT) 4 MG disintegrating tablet, Take 1 tablet (4 mg total) by mouth every 8 (eight) hours as needed for up to 20 doses for nausea or vomiting., Disp: 20 tablet, Rfl: 0   rizatriptan (MAXALT) 10 MG tablet, Take 1 tablet (10 mg total) by mouth as needed for  migraine. May repeat in 2 hours if needed, Disp: 18 tablet, Rfl: 4   traMADol (ULTRAM) 50 MG tablet, Take 1 tablet (50 mg total) by mouth every 8 (eight) hours as needed for up to 20 doses., Disp: 20 tablet, Rfl: 0  Past Medical Problems: Past Medical History:  Diagnosis Date   Acute maxillary sinusitis 08/02/2020   Anxiety    Bilateral posterior neck pain 07/19/2020   Body mass index (BMI) 34.0-34.9, adult 07/19/2020   Cephalalgia 08/01/2020   Chronic headaches    Dermatitis 08/30/2020   Encounter for medical examination to establish care 07/19/2020   Excessive or frequent menstruation 11/21/2013   Fall down stairs 10/26/2020   Laboratory tests ordered as part of a complete physical exam (CPE) 07/19/2020   Neck pain with history of cervical spinal surgery 08/01/2020   Screening mammogram for breast cancer 07/19/2020   Slow transit constipation 07/19/2020   Weight gain 07/19/2020    Past Surgical History: Past Surgical History:  Procedure Laterality Date   APPENDECTOMY     CERVICAL [place] & CURRETTAGE/HYSTROSCOPY WITH THERMACHOICE ABLATION N/A 01/30/2014   Procedure: DILATATION & CURETTAGE/HYSTEROSCOPY WITH THERMACHOICE ABLATION;  Surgeon: Florian Buff, MD;  Location: [facility];  Service: Gynecology;  Laterality: N/A;  Total Ablation Therapy Time 9 minutes 5 seconds; 86-88 deg C   ENDOMETRIAL ABLATION     LAPAROSCOPIC BILATERAL SALPINGECTOMY Bilateral 01/30/2014   Procedure: LAPAROSCOPIC BILATERAL SALPINGECTOMY;  Surgeon: Florian Buff, MD;  Location: [facility];  Service: Gynecology;  Laterality: Bilateral;    Social History: Social History   Socioeconomic History   Marital status: Married    Spouse name: Mervin Kung   Number of children: 1   Years of education: Not on file   Highest education level: Not on file  Occupational History   Not on file  Tobacco Use   Smoking status: Former    Packs/day: 0.25    Years: 1.00    Total pack years: 0.25    Types:  Cigarettes    Quit date: 01/26/1999    Years since quitting: 23.0   Smokeless tobacco: Never  Vaping Use   Vaping Use: Never used  Substance and Sexual Activity   Alcohol use: Yes    Comment: occassional   Drug use: No   Sexual activity: Yes    Partners: Male    Birth control/protection: Surgical    Comment: monogomous relationship  Other Topics Concern   Not on file  Social History Narrative   Not on file   Social Determinants of Health   Financial Resource Strain: Not on file  Food Insecurity: Not on file  Transportation Needs: Not on file  Physical Activity: Inactive (07/19/2020)   Exercise Vital Sign    Days of Exercise per Week: 0 days    Minutes of Exercise per Session: 0  min  Stress: Not on file  Social Connections: Not on file  Intimate Partner Violence: Not At Risk (07/19/2020)   Humiliation, Afraid, Rape, and Kick questionnaire    Fear of Current or Ex-Partner: No    Emotionally Abused: No    Physically Abused: No    Sexually Abused: No    Family History: Family History  Problem Relation Age of Onset   Hypertension Mother    Mental illness Mother    Heart failure Father    Heart disease Father    Hypertension Father    Diabetes Brother    Hyperlipidemia Brother    Diabetes Paternal Grandmother    Heart disease Paternal Grandmother    Breast cancer Paternal Grandmother 94   Breast cancer Paternal Aunt 62    Review of Systems: Denies fevers, chills, shortness of breath.  She denies any recent changes in her health.  Physical Exam: Vital Signs BP 119/86 (BP Location: Left Arm, Patient Position: Sitting, Cuff Size: Large)   Pulse 100   Ht '5\' 8"'  (1.727 m)   Wt 214 lb (97.1 kg)   SpO2 97%   BMI 32.54 kg/m   Physical Exam  Constitutional:      General: Not in acute distress.    Appearance: Normal appearance. Not ill-appearing.  HENT:     Head: Normocephalic and atraumatic.  Eyes:     Pupils: Pupils are equal, round Neck:     Musculoskeletal:  Normal range of motion.  Cardiovascular:     Rate and Rhythm: Normal rate Pulmonary:     Effort: Pulmonary effort is normal. No respiratory distress.  Musculoskeletal: Normal range of motion.  Lower Extremities: Nonswollen, no varicose veins noted bilaterally. Skin:    General: Skin is warm and dry.     Findings: No erythema or rash.  Neurological:     Mental Status: Alert and oriented to person, place, and time. Mental status is at baseline.  Psychiatric:        Mood and Affect: Mood normal.        Behavior: Behavior normal.    Assessment/Plan: The patient is scheduled for bilateral breast reduction with Dr. Marla Roe.  Risks, benefits, and alternatives of procedure discussed, questions answered and consent obtained.    Smoking Status: Non-smoker; Counseling Given?  N/A Last Mammogram: 02/14/2022; Results: Mammogram results are not yet available.  Patient states that she will call us if there is any abnormalities to her mammogram.  Caprini Score: 4; Risk Factors include: Age, BMI greater than 25, and length of planned surgery. Recommendation for mechanical prophylaxis. Encourage early ambulation.   Pictures obtained: '@consult'   Post-op Rx sent to pharmacy: Tramadol, Zofran ODT, Keflex  Patient reports she experiences nausea and vomiting when taking oxycodone. We discussed the option of tramadol and patient felt comfortable trying that for postoperative pain medication. Patient also requested zofran odt.   I discussed with the patient that she should hold her ibuprofen 1 week leading up to surgery.  I also discussed with the patient that she should hold her Flexeril, gabapentin and rizatriptan the day of surgery.  Patient expressed understanding.  Patient was provided with the breast reduction and General Surgical Risk consent document and Pain Medication Agreement prior to their appointment.  They had adequate time to read through the risk consent documents and Pain Medication  Agreement. We also discussed them in person together during this preop appointment. All of their questions were answered to their satisfaction.  Recommended calling if they have  any further questions.  Risk consent form and Pain Medication Agreement to be scanned into patient's chart.  The risk that can be encountered with breast reduction were discussed and include the following but not limited to these:  Breast asymmetry, fluid accumulation, firmness of the breast, inability to breast feed, loss of nipple or areola, skin loss, decrease or no nipple sensation, fat necrosis of the breast tissue, bleeding, infection, healing delay.  There are risks of anesthesia, changes to skin sensation and injury to nerves or blood vessels.  The muscle can be temporarily or permanently injured.  You may have an allergic reaction to tape, suture, glue, blood products which can result in skin discoloration, swelling, pain, skin lesions, poor healing.  Any of these can lead to the need for revisonal surgery or stage procedures.  A reduction has potential to interfere with diagnostic procedures.  Nipple or breast piercing can increase risks of infection.  This procedure is best done when the breast is fully developed.  Changes in the breast will continue to occur over time.  Pregnancy can alter the outcomes of previous breast reduction surgery, weight gain and weigh loss can also effect the long term appearance.   The consent was obtained with risks and complications reviewed which included bleeding, pain, scar, infection and the risk of anesthesia.  The patients questions were answered to the patients expressed satisfaction.   Electronically signed by: Clance Boll, PA-C 02/17/2022 4:33 PM

## 2022-02-18 ENCOUNTER — Telehealth: Payer: Self-pay

## 2022-02-18 ENCOUNTER — Other Ambulatory Visit: Payer: Self-pay

## 2022-02-18 ENCOUNTER — Encounter (HOSPITAL_BASED_OUTPATIENT_CLINIC_OR_DEPARTMENT_OTHER): Payer: Self-pay | Admitting: Plastic Surgery

## 2022-02-18 ENCOUNTER — Other Ambulatory Visit: Payer: Self-pay | Admitting: Nurse Practitioner

## 2022-02-18 DIAGNOSIS — R928 Other abnormal and inconclusive findings on diagnostic imaging of breast: Secondary | ICD-10-CM

## 2022-02-18 DIAGNOSIS — N6489 Other specified disorders of breast: Secondary | ICD-10-CM

## 2022-02-18 NOTE — Telephone Encounter (Signed)
Pt called in stating mammogram was needed per insurance company for breast reduction. Mammogram was abnormal on the right side requiring a diagnostic mammogram on the right. Order placed and pt will call to schedule at the [facility].

## 2022-02-21 ENCOUNTER — Ambulatory Visit: Payer: No Typology Code available for payment source

## 2022-02-21 ENCOUNTER — Ambulatory Visit
Admission: RE | Admit: 2022-02-21 | Discharge: 2022-02-21 | Disposition: A | Discharge status: Home / Self Care | Payer: No Typology Code available for payment source | Source: Ambulatory Visit | Attending: Nurse Practitioner | Admitting: Nurse Practitioner

## 2022-02-21 DIAGNOSIS — R928 Other abnormal and inconclusive findings on diagnostic imaging of breast: Secondary | ICD-10-CM

## 2022-02-22 ENCOUNTER — Emergency Department
Admission: EM | Admit: 2022-02-22 | Discharge: 2022-02-22 | Disposition: A | Discharge status: Home / Self Care | Payer: No Typology Code available for payment source | Attending: Emergency Medicine | Admitting: Emergency Medicine

## 2022-02-22 ENCOUNTER — Emergency Department: Payer: No Typology Code available for payment source

## 2022-02-22 ENCOUNTER — Other Ambulatory Visit: Payer: Self-pay

## 2022-02-22 DIAGNOSIS — M542 Cervicalgia: Secondary | ICD-10-CM | POA: Insufficient documentation

## 2022-02-22 DIAGNOSIS — Y92410 Unspecified street and highway as the place of occurrence of the external cause: Secondary | ICD-10-CM | POA: Insufficient documentation
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
## 2022-02-24 ENCOUNTER — Other Ambulatory Visit (HOSPITAL_BASED_OUTPATIENT_CLINIC_OR_DEPARTMENT_OTHER): Payer: Self-pay

## 2022-02-24 MED ORDER — INFLUENZA VAC SPLIT QUAD 0.5 ML IM SUSY
PREFILLED_SYRINGE | INTRAMUSCULAR | 0 refills | Status: AC
  Filled 2022-02-24: qty 0.5, 1d supply, fill #0

## 2022-02-26 NOTE — Anesthesia Preprocedure Evaluation (Signed)
Anesthesia Evaluation  Patient identified by MRN, date of birth, ID band Patient awake    Reviewed: Allergy & Precautions, NPO status , Patient's Chart, lab work & pertinent test results  History of Anesthesia Complications (+) PONV and history of anesthetic complications  Airway Mallampati: II  TM Distance: >3 FB Neck ROM: Full    Dental no notable dental hx. (+) Dental Advisory Given   Pulmonary neg pulmonary ROS, former smoker,    Pulmonary exam normal        Cardiovascular negative cardio ROS Normal cardiovascular exam     Neuro/Psych PSYCHIATRIC DISORDERS Anxiety negative neurological ROS     GI/Hepatic negative GI ROS, Neg liver ROS,   Endo/Other  negative endocrine ROS  Renal/GU negative Renal ROS     Musculoskeletal negative musculoskeletal ROS (+)   Abdominal   Peds  Hematology negative hematology ROS (+)   Anesthesia Other Findings   Reproductive/Obstetrics                            Anesthesia Physical Anesthesia Plan  ASA: 2  Anesthesia Plan: General   Post-op Pain Management: Tylenol PO (pre-op)* and Celebrex PO (pre-op)*   Induction: Intravenous  PONV Risk Score and Plan: 4 or greater and Ondansetron, Dexamethasone, Midazolam and Scopolamine patch - Pre-op  Airway Management Planned: Oral ETT  Additional Equipment:   Intra-op Plan:   Post-operative Plan: Extubation in OR  Informed Consent: I have reviewed the patients History and Physical, chart, labs and discussed the procedure including the risks, benefits and alternatives for the proposed anesthesia with the patient or authorized representative who has indicated his/her understanding and acceptance.     Dental advisory given  Plan Discussed with: Anesthesiologist and CRNA  Anesthesia Plan Comments:        Anesthesia Quick Evaluation

## 2022-02-27 ENCOUNTER — Ambulatory Visit (HOSPITAL_BASED_OUTPATIENT_CLINIC_OR_DEPARTMENT_OTHER)
Admission: RE | Admit: 2022-02-27 | Discharge: 2022-02-27 | Disposition: A | Discharge status: Home / Self Care | Payer: No Typology Code available for payment source | Attending: Plastic Surgery | Admitting: Plastic Surgery

## 2022-02-27 ENCOUNTER — Other Ambulatory Visit: Payer: Self-pay

## 2022-02-27 ENCOUNTER — Ambulatory Visit (HOSPITAL_BASED_OUTPATIENT_CLINIC_OR_DEPARTMENT_OTHER): Payer: No Typology Code available for payment source | Admitting: Anesthesiology

## 2022-02-27 ENCOUNTER — Encounter (HOSPITAL_BASED_OUTPATIENT_CLINIC_OR_DEPARTMENT_OTHER): Payer: Self-pay | Admitting: Plastic Surgery

## 2022-02-27 ENCOUNTER — Encounter (HOSPITAL_BASED_OUTPATIENT_CLINIC_OR_DEPARTMENT_OTHER)
Admission: RE | Disposition: A | Discharge status: Home / Self Care | Payer: Self-pay | Source: Home / Self Care | Attending: Plastic Surgery

## 2022-02-27 DIAGNOSIS — M542 Cervicalgia: Secondary | ICD-10-CM

## 2022-02-27 DIAGNOSIS — N6021 Fibroadenosis of right breast: Secondary | ICD-10-CM | POA: Insufficient documentation

## 2022-02-27 DIAGNOSIS — Z87891 Personal history of nicotine dependence: Secondary | ICD-10-CM | POA: Diagnosis not present

## 2022-02-27 DIAGNOSIS — N62 Hypertrophy of breast: Secondary | ICD-10-CM

## 2022-02-27 DIAGNOSIS — Z01818 Encounter for other preprocedural examination: Secondary | ICD-10-CM

## 2022-02-27 DIAGNOSIS — N6082 Other benign mammary dysplasias of left breast: Secondary | ICD-10-CM | POA: Insufficient documentation

## 2022-02-27 DIAGNOSIS — M549 Dorsalgia, unspecified: Secondary | ICD-10-CM | POA: Insufficient documentation

## 2022-02-27 HISTORY — PX: BREAST REDUCTION SURGERY: SHX8

## 2022-02-27 HISTORY — DX: Other specified postprocedural states: Z98.890

## 2022-02-27 LAB — POCT PREGNANCY, URINE: Preg Test, Ur: NEGATIVE

## 2022-02-27 SURGERY — BREAST REDUCTION WITH LIPOSUCTION
Anesthesia: General | Site: Breast | Laterality: Bilateral

## 2022-02-27 MED ORDER — SODIUM CHLORIDE 0.9% FLUSH: 3.0000 mL | INTRAVENOUS | Status: DC | PRN

## 2022-02-27 MED ORDER — ACETAMINOPHEN 500 MG PO TABS
1000.0000 mg | ORAL_TABLET | Freq: Once | ORAL | Status: AC
  Administered 2022-02-27: 1000 mg via ORAL

## 2022-02-27 MED ORDER — HYDROMORPHONE HCL 1 MG/ML IJ SOLN: INTRAMUSCULAR | Status: DC | PRN

## 2022-02-27 MED ORDER — EPINEPHRINE PF 1 MG/ML IJ SOLN
INTRAMUSCULAR | Status: AC
  Filled 2022-02-27: qty 1

## 2022-02-27 MED ORDER — LIDOCAINE HCL (CARDIAC) PF 100 MG/5ML IV SOSY
PREFILLED_SYRINGE | INTRAVENOUS | Status: DC | PRN
  Administered 2022-02-27: 100 mg via INTRAVENOUS

## 2022-02-27 MED ORDER — SODIUM CHLORIDE 0.9 % IV SOLN: 250.0000 mL | INTRAVENOUS | Status: DC | PRN

## 2022-02-27 MED ORDER — FENTANYL CITRATE (PF) 100 MCG/2ML IJ SOLN
INTRAMUSCULAR | Status: DC | PRN
  Administered 2022-02-27: 25 ug via INTRAVENOUS
  Administered 2022-02-27: 50 ug via INTRAVENOUS
  Administered 2022-02-27: 100 ug via INTRAVENOUS
  Administered 2022-02-27: 50 ug via INTRAVENOUS
  Administered 2022-02-27: 25 ug via INTRAVENOUS

## 2022-02-27 MED ORDER — MIDAZOLAM HCL 5 MG/5ML IJ SOLN
INTRAMUSCULAR | Status: DC | PRN
  Administered 2022-02-27: 2 mg via INTRAVENOUS

## 2022-02-27 MED ORDER — ROCURONIUM BROMIDE 100 MG/10ML IV SOLN
INTRAVENOUS | Status: DC | PRN
  Administered 2022-02-27: 100 mg via INTRAVENOUS

## 2022-02-27 MED ORDER — SCOPOLAMINE 1 MG/3DAYS TD PT72
MEDICATED_PATCH | TRANSDERMAL | Status: AC
  Filled 2022-02-27: qty 1

## 2022-02-27 MED ORDER — PROPOFOL 500 MG/50ML IV EMUL
INTRAVENOUS | Status: DC | PRN
  Administered 2022-02-27: 30 ug/kg/min via INTRAVENOUS

## 2022-02-27 MED ORDER — AMISULPRIDE (ANTIEMETIC) 5 MG/2ML IV SOLN
10.0000 mg | Freq: Once | INTRAVENOUS | Status: AC | PRN
  Administered 2022-02-27: 10 mg via INTRAVENOUS

## 2022-02-27 MED ORDER — ROCURONIUM BROMIDE 10 MG/ML (PF) SYRINGE
PREFILLED_SYRINGE | INTRAVENOUS | Status: AC
  Filled 2022-02-27: qty 10

## 2022-02-27 MED ORDER — ONDANSETRON HCL 4 MG/2ML IJ SOLN
INTRAMUSCULAR | Status: DC | PRN
  Administered 2022-02-27 (×2): 4 mg via INTRAVENOUS

## 2022-02-27 MED ORDER — PROPOFOL 10 MG/ML IV BOLUS
INTRAVENOUS | Status: DC | PRN
  Administered 2022-02-27: 200 mg via INTRAVENOUS

## 2022-02-27 MED ORDER — FENTANYL CITRATE (PF) 100 MCG/2ML IJ SOLN
INTRAMUSCULAR | Status: AC
  Filled 2022-02-27: qty 2

## 2022-02-27 MED ORDER — CEFAZOLIN SODIUM-DEXTROSE 2-4 GM/100ML-% IV SOLN
2.0000 g | INTRAVENOUS | Status: AC
  Administered 2022-02-27: 2 g via INTRAVENOUS

## 2022-02-27 MED ORDER — BUPIVACAINE HCL (PF) 0.25 % IJ SOLN
INTRAMUSCULAR | Status: AC
  Filled 2022-02-27: qty 30

## 2022-02-27 MED ORDER — LIDOCAINE HCL 1 % IJ SOLN
INTRAVENOUS | Status: DC | PRN
  Administered 2022-02-27: 500 mL

## 2022-02-27 MED ORDER — OXYCODONE HCL 5 MG PO TABS: 5.0000 mg | ORAL_TABLET | ORAL | Status: DC | PRN

## 2022-02-27 MED ORDER — CEFAZOLIN SODIUM-DEXTROSE 2-4 GM/100ML-% IV SOLN
INTRAVENOUS | Status: AC
  Filled 2022-02-27: qty 100

## 2022-02-27 MED ORDER — ACETAMINOPHEN 500 MG PO TABS
ORAL_TABLET | ORAL | Status: AC
  Filled 2022-02-27: qty 2

## 2022-02-27 MED ORDER — PROPOFOL 10 MG/ML IV BOLUS
INTRAVENOUS | Status: AC
  Filled 2022-02-27: qty 20

## 2022-02-27 MED ORDER — DEXAMETHASONE SODIUM PHOSPHATE 10 MG/ML IJ SOLN
INTRAMUSCULAR | Status: AC
  Filled 2022-02-27: qty 1

## 2022-02-27 MED ORDER — PROMETHAZINE HCL 25 MG/ML IJ SOLN
INTRAMUSCULAR | Status: AC
  Filled 2022-02-27: qty 1

## 2022-02-27 MED ORDER — HYDROMORPHONE HCL 1 MG/ML IJ SOLN
INTRAMUSCULAR | Status: DC | PRN
  Administered 2022-02-27 (×2): .25 mg via INTRAVENOUS

## 2022-02-27 MED ORDER — HYDROMORPHONE HCL 1 MG/ML IJ SOLN
INTRAMUSCULAR | Status: AC
  Filled 2022-02-27: qty 1

## 2022-02-27 MED ORDER — SCOPOLAMINE 1 MG/3DAYS TD PT72
1.0000 | MEDICATED_PATCH | TRANSDERMAL | Status: DC
  Administered 2022-02-27: 1.5 mg via TRANSDERMAL

## 2022-02-27 MED ORDER — ACETAMINOPHEN 325 MG PO TABS: 650.0000 mg | ORAL_TABLET | ORAL | Status: DC | PRN

## 2022-02-27 MED ORDER — FENTANYL CITRATE (PF) 100 MCG/2ML IJ SOLN
25.0000 ug | INTRAMUSCULAR | Status: DC | PRN
  Administered 2022-02-27: 50 ug via INTRAVENOUS

## 2022-02-27 MED ORDER — PROMETHAZINE HCL 25 MG/ML IJ SOLN
6.2500 mg | INTRAMUSCULAR | Status: DC | PRN
  Administered 2022-02-27: 6.25 mg via INTRAVENOUS

## 2022-02-27 MED ORDER — 0.9 % SODIUM CHLORIDE (POUR BTL) OPTIME
TOPICAL | Status: DC | PRN
  Administered 2022-02-27: 200 mL

## 2022-02-27 MED ORDER — CHLORHEXIDINE GLUCONATE CLOTH 2 % EX PADS: 6.0000 | MEDICATED_PAD | Freq: Once | CUTANEOUS | Status: DC

## 2022-02-27 MED ORDER — SODIUM CHLORIDE 0.9% FLUSH: 3.0000 mL | Freq: Two times a day (BID) | INTRAVENOUS | Status: DC

## 2022-02-27 MED ORDER — CELECOXIB 200 MG PO CAPS
ORAL_CAPSULE | ORAL | Status: AC
  Filled 2022-02-27: qty 1

## 2022-02-27 MED ORDER — LIDOCAINE 2% (20 MG/ML) 5 ML SYRINGE
INTRAMUSCULAR | Status: AC
  Filled 2022-02-27: qty 5

## 2022-02-27 MED ORDER — ACETAMINOPHEN 325 MG RE SUPP: 650.0000 mg | RECTAL | Status: DC | PRN

## 2022-02-27 MED ORDER — LIDOCAINE HCL (PF) 1 % IJ SOLN
INTRAMUSCULAR | Status: AC
  Filled 2022-02-27: qty 60

## 2022-02-27 MED ORDER — LIDOCAINE-EPINEPHRINE 1 %-1:100000 IJ SOLN
INTRAMUSCULAR | Status: DC | PRN
  Administered 2022-02-27: 50 mL via INTRAMUSCULAR

## 2022-02-27 MED ORDER — AMISULPRIDE (ANTIEMETIC) 5 MG/2ML IV SOLN
INTRAVENOUS | Status: AC
  Filled 2022-02-27: qty 4

## 2022-02-27 MED ORDER — DEXAMETHASONE SODIUM PHOSPHATE 4 MG/ML IJ SOLN
INTRAMUSCULAR | Status: DC | PRN
  Administered 2022-02-27: 5 mg via INTRAVENOUS

## 2022-02-27 MED ORDER — LIDOCAINE-EPINEPHRINE 1 %-1:100000 IJ SOLN
INTRAMUSCULAR | Status: AC
  Filled 2022-02-27: qty 1

## 2022-02-27 MED ORDER — CELECOXIB 200 MG PO CAPS
200.0000 mg | ORAL_CAPSULE | Freq: Once | ORAL | Status: AC
  Administered 2022-02-27: 200 mg via ORAL

## 2022-02-27 MED ORDER — MIDAZOLAM HCL 2 MG/2ML IJ SOLN
INTRAMUSCULAR | Status: AC
  Filled 2022-02-27: qty 2

## 2022-02-27 MED ORDER — SUGAMMADEX SODIUM 200 MG/2ML IV SOLN
INTRAVENOUS | Status: DC | PRN
  Administered 2022-02-27: 200 mg via INTRAVENOUS

## 2022-02-27 MED ORDER — ONDANSETRON HCL 4 MG/2ML IJ SOLN
INTRAMUSCULAR | Status: AC
  Filled 2022-02-27: qty 2

## 2022-02-27 MED ORDER — LACTATED RINGERS IV SOLN: INTRAVENOUS | Status: DC

## 2022-02-27 SURGICAL SUPPLY — 65 items
ADH SKN CLS APL DERMABOND .7 (GAUZE/BANDAGES/DRESSINGS) ×4
AGENT HMST PWDR BTL CLGN 5GM (Miscellaneous) ×1 IMPLANT
BAG DECANTER FOR FLEXI CONT (MISCELLANEOUS) ×2 IMPLANT
BINDER BREAST LRG (GAUZE/BANDAGES/DRESSINGS) IMPLANT
BINDER BREAST MEDIUM (GAUZE/BANDAGES/DRESSINGS) IMPLANT
BINDER BREAST XLRG (GAUZE/BANDAGES/DRESSINGS) IMPLANT
BINDER BREAST XXLRG (GAUZE/BANDAGES/DRESSINGS) IMPLANT
BIOPATCH RED 1 DISK 7.0 (GAUZE/BANDAGES/DRESSINGS) IMPLANT
BLADE HEX COATED 2.75 (ELECTRODE) IMPLANT
BLADE KNIFE PERSONA 10 (BLADE) ×4 IMPLANT
BLADE SURG 15 STRL LF DISP TIS (BLADE) ×2 IMPLANT
BLADE SURG 15 STRL SS (BLADE) ×1
CANISTER SUCT 1200ML W/VALVE (MISCELLANEOUS) ×2 IMPLANT
COLLAGEN CELLERATERX 5 GRAM (Miscellaneous) IMPLANT
COVER BACK TABLE 60X90IN (DRAPES) ×2 IMPLANT
COVER MAYO STAND STRL (DRAPES) ×2 IMPLANT
DERMABOND ADVANCED .7 DNX12 (GAUZE/BANDAGES/DRESSINGS) ×4 IMPLANT
DRAIN CHANNEL 19F RND (DRAIN) IMPLANT
DRAPE LAPAROSCOPIC ABDOMINAL (DRAPES) ×2 IMPLANT
DRSG OPSITE POSTOP 4X12 (GAUZE/BANDAGES/DRESSINGS) IMPLANT
DRSG OPSITE POSTOP 4X6 (GAUZE/BANDAGES/DRESSINGS) IMPLANT
ELECT BLADE 4.0 EZ CLEAN MEGAD (MISCELLANEOUS) ×1
ELECT REM PT RETURN 9FT ADLT (ELECTROSURGICAL) ×1
ELECTRODE BLDE 4.0 EZ CLN MEGD (MISCELLANEOUS) ×2 IMPLANT
ELECTRODE REM PT RTRN 9FT ADLT (ELECTROSURGICAL) ×2 IMPLANT
EVACUATOR SILICONE 100CC (DRAIN) IMPLANT
GAUZE PAD ABD 8X10 STRL (GAUZE/BANDAGES/DRESSINGS) ×4 IMPLANT
GLOVE BIO SURGEON STRL SZ 6.5 (GLOVE) ×6 IMPLANT
GLOVE BIO SURGEON STRL SZ7.5 (GLOVE) ×2 IMPLANT
GLOVE BIOGEL PI IND STRL 7.0 (GLOVE) ×2 IMPLANT
GOWN STRL REUS W/ TWL LRG LVL3 (GOWN DISPOSABLE) ×4 IMPLANT
GOWN STRL REUS W/TWL LRG LVL3 (GOWN DISPOSABLE) ×2
NDL FILTER BLUNT 18X1 1/2 (NEEDLE) ×2 IMPLANT
NDL HYPO 25X1 1.5 SAFETY (NEEDLE) ×2 IMPLANT
NDL SAFETY ECLIP 18X1.5 (MISCELLANEOUS) IMPLANT
NEEDLE FILTER BLUNT 18X1 1/2 (NEEDLE) ×1 IMPLANT
NEEDLE HYPO 25X1 1.5 SAFETY (NEEDLE) ×1 IMPLANT
NS IRRIG 1000ML POUR BTL (IV SOLUTION) IMPLANT
PACK BASIN DAY SURGERY FS (CUSTOM PROCEDURE TRAY) ×2 IMPLANT
PAD ALCOHOL SWAB (MISCELLANEOUS) IMPLANT
PAD FOAM SILICONE BACKED (GAUZE/BANDAGES/DRESSINGS) IMPLANT
PENCIL SMOKE EVACUATOR (MISCELLANEOUS) ×2 IMPLANT
PIN SAFETY STERILE (MISCELLANEOUS) IMPLANT
SLEEVE SCD COMPRESS KNEE MED (STOCKING) ×2 IMPLANT
SPIKE FLUID TRANSFER (MISCELLANEOUS) IMPLANT
SPONGE T-LAP 18X18 ~~LOC~~+RFID (SPONGE) ×4 IMPLANT
STRIP SUTURE WOUND CLOSURE 1/2 (MISCELLANEOUS) ×4 IMPLANT
SUT MNCRL AB 4-0 PS2 18 (SUTURE) ×8 IMPLANT
SUT MON AB 3-0 SH 27 (SUTURE) ×7
SUT MON AB 3-0 SH27 (SUTURE) ×8 IMPLANT
SUT MON AB 5-0 PS2 18 (SUTURE) IMPLANT
SUT PDS 3-0 CT2 (SUTURE) ×4
SUT PDS II 3-0 CT2 27 ABS (SUTURE) ×8 IMPLANT
SUT SILK 3 0 PS 1 (SUTURE) IMPLANT
SYR 3ML 23GX1 SAFETY (SYRINGE) IMPLANT
SYR 50ML LL SCALE MARK (SYRINGE) IMPLANT
SYR BULB IRRIG 60ML STRL (SYRINGE) ×2 IMPLANT
SYR CONTROL 10ML LL (SYRINGE) ×2 IMPLANT
TOWEL GREEN STERILE FF (TOWEL DISPOSABLE) ×4 IMPLANT
TRAY DSU PREP LF (CUSTOM PROCEDURE TRAY) ×2 IMPLANT
TUBE CONNECTING 20X1/4 (TUBING) ×2 IMPLANT
TUBING INFILTRATION IT-10001 (TUBING) IMPLANT
TUBING SET GRADUATE ASPIR 12FT (MISCELLANEOUS) IMPLANT
UNDERPAD 30X36 HEAVY ABSORB (UNDERPADS AND DIAPERS) ×4 IMPLANT
YANKAUER SUCT BULB TIP NO VENT (SUCTIONS) ×2 IMPLANT

## 2022-02-27 NOTE — Interval H&P Note (Signed)
History and Physical Interval Note:  02/27/2022 7:10 AM  Elizabeth Franklin  has presented today for surgery, with the diagnosis of symptomatic mammary hypertrophy.  The various methods of treatment have been discussed with the patient and family. After consideration of risks, benefits and other options for treatment, the patient has consented to  Procedure(s): MAMMARY REDUCTION  (BREAST) (Bilateral) as a surgical intervention.  The patient's history has been reviewed, patient examined, no change in status, stable for surgery.  I have reviewed the patient's chart and labs.  Questions were answered to the patient's satisfaction.     Loel Lofty Lodema Parma

## 2022-02-27 NOTE — Transfer of Care (Signed)
Immediate Anesthesia Transfer of Care Note  Patient: Elizabeth Franklin  Procedure(s) Performed: BREAST REDUCTION WITH LIPOSUCTION (Bilateral: Breast)  Patient Location: PACU  Anesthesia Type:General  Level of Consciousness: awake, alert  and oriented  Airway & Oxygen Therapy: Patient Spontanous Breathing and Patient connected to face mask oxygen  Post-op Assessment: Report given to RN and Post -op Vital signs reviewed and stable  Post vital signs: Reviewed and stable  Last Vitals:  Vitals Value Taken Time  BP 113/75 02/27/22 0946  Temp    Pulse 94 02/27/22 0947  Resp 16 02/27/22 0947  SpO2 91 % 02/27/22 0947  Vitals shown include unvalidated device data.  Last Pain:  Vitals:   02/27/22 0635  TempSrc: Oral  PainSc: 0-No pain         Complications: No notable events documented.

## 2022-02-27 NOTE — Op Note (Signed)
Breast Reduction Op note:    DATE OF PROCEDURE: 02/27/2022  LOCATION: [facility]  SURGEON: Lyndee Leo Sanger Shineka Auble, DO  ASSISTANT: Roetta Sessions, PA  PREOPERATIVE DIAGNOSIS 1. Macromastia 2. Neck Pain 3. Back Pain  POSTOPERATIVE DIAGNOSIS 1. Macromastia 2. Neck Pain 3. Back Pain  PROCEDURES 1. Bilateral breast reduction.  Right reduction 500 g, Left reduction 520 g 2. Placement of Cellerate in bilateral breasts 5 gm.  COMPLICATIONS: None.  DRAINS: none  INDICATIONS FOR PROCEDURE Elizabeth Franklin is a 41 y.o. year-old female born on February 21, 1981,with a history of symptomatic macromastia with concominant back pain, neck pain, shoulder grooving from her bra.   MRN: [id number]  CONSENT Informed consent was obtained directly from the patient. The risks, benefits and alternatives were fully discussed. Specific risks including but not limited to bleeding, infection, hematoma, seroma, scarring, pain, nipple necrosis, asymmetry, poor cosmetic results, and need for further surgery were discussed. The patient had ample opportunity to have her questions answered to her satisfaction.  DESCRIPTION OF PROCEDURE  Patient was brought into the operating room and placed in a supine position.  SCDs were placed and appropriate padding was performed.  Antibiotics were given. The patient underwent general anesthesia and the chest was prepped and draped in a sterile fashion.  A timeout was performed and all information was confirmed to be correct.  Right side: Preoperative markings were confirmed.  Incision lines were injected with local with epinephrine.  After waiting for vasoconstriction, the marked lines were incised.  A Wise-pattern superomedial breast reduction was performed by de-epithelializing the pedicle, using bovie to create the superomedial pedicle, and removing breast tissue from the lateral and inferior portions of the breast.  Care was taken to not undermine  the breast pedicle. Hemostasis was achieved.  The nipple was gently rotated into position and the soft tissue closed with 4-0 Monocryl.   The pocket was irrigated and hemostasis confirmed.  Cellerate was placed to aid in healing. The deep tissues were approximated with 3-0 PDS and 3-0 Monocryl sutures and the skin was closed with deep dermal and subcuticular 4-0 Monocryl sutures.  The nipple and skin flaps had good capillary refill at the end of the procedure.    Left side: Preoperative markings were confirmed.  Incision lines were injected with local with epinephrine.  After waiting for vasoconstriction, the marked lines were incised.  A Wise-pattern superomedial breast reduction was performed by de-epithelializing the pedicle, using bovie to create the superomedial pedicle, and removing breast tissue from the lateral and inferior portions of the breast.  Care was taken to not undermine the breast pedicle. Hemostasis was achieved.  The nipple was gently rotated into position and the soft tissue was closed with 4-0 Monocryl.  The patient was sat upright and size and shape symmetry was confirmed.  The pocket was irrigated and hemostasis confirmed.  Cellerate was placed to aid in healing. The deep tissues were approximated with 3-0 PDS and 3-0 Monocryl sutures and the skin was closed with deep dermal and subcuticular 4-0 Monocryl sutures.  Dermabond was applied.  A breast binder and ABDs were placed.  The nipple and skin flaps had good capillary refill at the end of the procedure.  The patient tolerated the procedure well. The patient was allowed to wake from anesthesia and taken to the recovery room in satisfactory condition.  The advanced practice practitioner (APP) assisted throughout the case.  The APP was essential in retraction and counter traction when needed to make the  case progress smoothly.  This retraction and assistance made it possible to see the tissue plans for the procedure.  The assistance was  needed for blood control, tissue re-approximation and assisted with closure of the incision site.

## 2022-02-27 NOTE — Anesthesia Postprocedure Evaluation (Signed)
Anesthesia Post Note  Patient: Astronomer  Procedure(s) Performed: BREAST REDUCTION WITH LIPOSUCTION (Bilateral: Breast)     Patient location during evaluation: PACU Anesthesia Type: General Level of consciousness: sedated Pain management: pain level controlled Vital Signs Assessment: post-procedure vital signs reviewed and stable Respiratory status: spontaneous breathing and respiratory function stable Cardiovascular status: stable Postop Assessment: no apparent nausea or vomiting Anesthetic complications: no   No notable events documented.  Last Vitals:  Vitals:   02/27/22 1100 02/27/22 1116  BP:  125/80  Pulse: 77 78  Resp: 15 14  Temp:  36.5 C  SpO2: 97% 99%    Last Pain:  Vitals:   02/27/22 1116  TempSrc: Oral  PainSc: 0-No pain                 Ugonna Keirsey DANIEL

## 2022-02-27 NOTE — Discharge Instructions (Addendum)
INSTRUCTIONS FOR AFTER BREAST SURGERY   You will likely have some questions about what to expect following your operation.  The following information will help you and your family understand what to expect when you are discharged from the hospital.  Following these guidelines will help ensure a smooth recovery and reduce risks of complications.  Postoperative instructions include information on: diet, wound care, medications and physical activity.  AFTER SURGERY Expect to go home after the procedure.  In some cases, you may need to spend one night in the hospital for observation.  DIET Breast surgery does not require a specific diet.  However, the healthier you eat the better your body can start healing. It is important to increasing your protein intake.  This means limiting the foods with sugar and carbohydrates.  Focus on vegetables and some meat.  If you have any liposuction during your procedure be sure to drink water.  If your urine is bright yellow, then it is concentrated, and you need to drink more water.  As a general rule after surgery, you should have 8 ounces of water every hour while awake.  If you find you are persistently nauseated or unable to take in liquids let us know.  NO TOBACCO USE or EXPOSURE.  This will slow your healing process and increase the risk of a wound.  WOUND CARE Leave the binder on for 3 days . Use fragrance free soap.   After 3 days you can remove the binder to shower. Once dry apply binder or sports bra.  Use a mild soap like Dial, Dove and [place]. You may have Topifoam or Lipofoam on.  It is soft and spongy and helps keep you from getting creases if you have liposuction.  This can be removed before the shower and then replaced.  If you need more it is available on [employer] (Lipofoam). If you have steri-strips / tape directly attached to your skin leave them in place. It is OK to get these wet.   No baths, pools or hot tubs for four weeks. We close your incision to  leave the smallest and best-looking scar. No ointment or creams on your incisions until given the go ahead.  Especially not Neosporin (Too many skin reactions with this one).  A few weeks after surgery you can use Mederma and start massaging the scar. We ask you to wear your binder or sports bra for the first 6 weeks around the clock, including while sleeping. This provides added comfort and helps reduce the fluid accumulation at the surgery site.  ACTIVITY No heavy lifting until cleared by the doctor.  This usually means no more than a half-gallon of milk.  It is OK to walk and climb stairs. In fact, moving your legs is very important to decrease your risk of a blood clot.  It will also help keep you from getting deconditioned.  Every 1 to 2 hours get up and walk for 5 minutes. This will help with a quicker recovery back to normal.  Let pain be your guide so you don't do too much.  This is not the time for spring cleaning and don't plan on taking care of anyone else.  This time is for you to recover,  You will be more comfortable if you sleep and rest with your head elevated either with a few pillows under you or in a recliner.  No stomach sleeping for a three months.  WORK Everyone returns to work at different times. As a  rough guide, most people take at least 1 - 2 weeks off prior to returning to work. If you need documentation for your job, bring the forms to your postoperative follow up visit.  DRIVING Arrange for someone to bring you home from the hospital.  You may be able to drive a few days after surgery but not while taking any narcotics or valium.  BOWEL MOVEMENTS Constipation can occur after anesthesia and while taking pain medication.  It is important to stay ahead for your comfort.  We recommend taking Milk of Magnesia (2 tablespoons; twice a day) while taking the pain pills.  MEDICATIONS You may be prescribed should start after surgery At your preoperative visit for you history and  physical you may have been given the following medications: An antibiotic: Start this medication when you get home and take according to the instructions on the bottle. Zofran 4 mg:  This is to treat nausea and vomiting.  You can take this every 6 hours as needed and only if needed. Valium 2 mg: This is for muscle tightness if you have an implant or expander. This will help relax your muscle which also helps with pain control.  This can be taken every 12 hours as needed. Don't drive after taking this medication. Norco (hydrocodone/acetaminophen) 5/325 mg:  This is only to be used after you have taken the motrin or the tylenol. Every 8 hours as needed.   Over the counter Medication to take: Ibuprofen (Motrin) 600 mg:  Take this every 6 hours.  If you have additional pain then take 500 mg of the tylenol every 8 hours.  Only take the Norco after you have tried these two. Miralax or stool softener of choice: Take this according to the bottle if you take the [place] Call your surgeon's office if any of the following occur: Fever 101 degrees F or greater Excessive bleeding or fluid from the incision site. Pain that increases over time without aid from the medications Redness, warmth, or pus draining from incision sites Persistent nausea or inability to take in liquids Severe misshapen area that underwent the operation.   Post Anesthesia Home Care Instructions  Activity: Get plenty of rest for the remainder of the day. A responsible individual must stay with you for 24 hours following the procedure.  For the next 24 hours, DO NOT: -Drive a car -Paediatric nurse -Drink alcoholic beverages -Take any medication unless instructed by your physician -Make any legal decisions or sign important papers.  Meals: Start with liquid foods such as gelatin or soup. Progress to regular foods as tolerated. Avoid greasy, spicy, heavy foods. If nausea and/or vomiting occur, drink only clear  liquids until the nausea and/or vomiting subsides. Call your physician if vomiting continues.  Special Instructions/Symptoms: Your throat may feel dry or sore from the anesthesia or the breathing tube placed in your throat during surgery. If this causes discomfort, gargle with warm salt water. The discomfort should disappear within 24 hours.  If you had a scopolamine patch placed behind your ear for the management of post- operative nausea and/or vomiting:  1. The medication in the patch is effective for 72 hours, after which it should be removed.  Wrap patch in a tissue and discard in the trash. Wash hands thoroughly with soap and water. 2. You may remove the patch earlier than 72 hours if you experience unpleasant side effects which may include dry mouth, dizziness or visual disturbances. 3. Avoid touching the patch.  Wash your hands with soap and water after contact with the patch.     *May have Tylenol or Ibuprofen at 12:45pm today 02/27/22

## 2022-02-27 NOTE — Anesthesia Procedure Notes (Signed)
Procedure Name: Intubation Date/Time: 02/27/2022 7:46 AM  Performed by: Verita Lamb, CRNAPre-anesthesia Checklist: Patient identified, Emergency Drugs available, Suction available and Patient being monitored Patient Re-evaluated:Patient Re-evaluated prior to induction Oxygen Delivery Method: Circle system utilized Preoxygenation: Pre-oxygenation with 100% oxygen Induction Type: IV induction Ventilation: Mask ventilation without difficulty Laryngoscope Size: Mac and 4 Grade View: Grade I Tube type: Oral Tube size: 7.0 mm Number of attempts: 1 Airway Equipment and Method: Stylet and Oral airway Placement Confirmation: ETT inserted through vocal cords under direct vision, positive ETCO2, breath sounds checked- equal and bilateral and CO2 detector Secured at: 23 cm Tube secured with: Tape Dental Injury: Teeth and Oropharynx as per pre-operative assessment

## 2022-02-28 ENCOUNTER — Encounter (HOSPITAL_BASED_OUTPATIENT_CLINIC_OR_DEPARTMENT_OTHER): Payer: Self-pay | Admitting: Plastic Surgery

## 2022-02-28 LAB — SURGICAL PATHOLOGY

## 2022-03-06 ENCOUNTER — Other Ambulatory Visit (HOSPITAL_BASED_OUTPATIENT_CLINIC_OR_DEPARTMENT_OTHER): Payer: Self-pay

## 2022-03-06 ENCOUNTER — Ambulatory Visit (INDEPENDENT_AMBULATORY_CARE_PROVIDER_SITE_OTHER): Payer: No Typology Code available for payment source | Admitting: Plastic Surgery

## 2022-03-06 ENCOUNTER — Encounter: Payer: Self-pay | Admitting: Plastic Surgery

## 2022-03-06 ENCOUNTER — Ambulatory Visit (INDEPENDENT_AMBULATORY_CARE_PROVIDER_SITE_OTHER): Payer: No Typology Code available for payment source | Admitting: Nurse Practitioner

## 2022-03-06 ENCOUNTER — Encounter (HOSPITAL_BASED_OUTPATIENT_CLINIC_OR_DEPARTMENT_OTHER): Payer: Self-pay | Admitting: Nurse Practitioner

## 2022-03-06 DIAGNOSIS — M549 Dorsalgia, unspecified: Secondary | ICD-10-CM | POA: Diagnosis not present

## 2022-03-06 DIAGNOSIS — N62 Hypertrophy of breast: Secondary | ICD-10-CM

## 2022-03-06 MED ORDER — IBUPROFEN 800 MG PO TABS
800.0000 mg | ORAL_TABLET | Freq: Three times a day (TID) | ORAL | 6 refills | Status: AC | PRN
  Filled 2022-03-06: qty 90, 30d supply, fill #0
  Filled 2022-12-13: qty 90, 30d supply, fill #1
  Filled 2023-02-09: qty 90, 30d supply, fill #2

## 2022-03-06 MED ORDER — CYCLOBENZAPRINE HCL 10 MG PO TABS
10.0000 mg | ORAL_TABLET | Freq: Three times a day (TID) | ORAL | 6 refills | Status: DC | PRN
  Filled 2022-03-06: qty 60, 20d supply, fill #0
  Filled 2022-06-27: qty 60, 20d supply, fill #1
  Filled 2022-12-13: qty 60, 20d supply, fill #2
  Filled 2023-02-09: qty 60, 20d supply, fill #3
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
## 2022-03-06 NOTE — Progress Notes (Signed)
   Subjective:    Patient ID: Elizabeth Franklin, female    DOB: 1981/04/23, 41 y.o.   MRN: [id number]  The patient is a 41 year old female here for follow-up after bilateral breast reduction a week ago.  She had approximately 500 g removed from both breasts.  She is pleased with her overall size.  She has swelling and bruising as expected especially on the lateral aspect.  I removed the dressings today but the Steri-Strips are still in place.  There is no sign of a hematoma or seroma.      Review of Systems  Constitutional: Negative.   Eyes: Negative.   Respiratory: Negative.    Cardiovascular: Negative.   Gastrointestinal: Negative.   Genitourinary: Negative.        Objective:   Physical Exam Constitutional:      Appearance: Normal appearance.  Cardiovascular:     Rate and Rhythm: Normal rate.     Pulses: Normal pulses.  Neurological:     Mental Status: She is alert and oriented to person, place, and time.  Psychiatric:        Mood and Affect: Mood normal.        Behavior: Behavior normal.        Thought Content: Thought content normal.       Assessment & Plan:     ICD-10-CM   1. Symptomatic mammary hypertrophy  N62        Pictures were obtained of the patient and placed in the chart with the patient's or guardian's permission.

## 2022-03-07 ENCOUNTER — Encounter: Payer: No Typology Code available for payment source | Admitting: Plastic Surgery
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
## 2022-03-20 NOTE — Progress Notes (Signed)
Patient is a 41 year old female with PMH of macromastia s/p bilateral breast reduction with liposuction performed 02/27/2022 by Dr. Ulice Bold who presents to clinic for postoperative follow-up.  She was last seen here in clinic on 03/06/2022.  At that time, she was pleased with overall size.  Dressings were removed, Steri-Strips were left in place.  There is no evidence concerning for hematoma or seroma and plan was for continued activity modifications and compressive garments.  Today, patient is doing well.  She is accompanied by her husband at bedside.  No complaints today.  She has been tempted to pick some of the residual Dermabond from around periphery of nipples.  She is hopeful that the Steri-Strips can be removed today.  Pain is well controlled and she is overall pleased with the outcome of her breast reduction surgery.    Physical exam is entirely reassuring.  Breasts are soft.  Excellent shape and symmetry.  NAC's are viable.  Incisions CDI.  Small, 1 x 1 cm of superficial wound at left inferior T zone, nondraining.  No surrounding erythema or concern for infection.  Follow-up in 3 weeks.  Continued activity modifications and compressive garments in the interim.  Provided a return to work note for next week, but only if she can avoid lifting, pushing, or pulling 15 pounds or greater until she is 6 weeks postop.  Picture(s) obtained of the patient and placed in the chart were with the patient's or guardian's permission.   Patient's vital signs for today's encounter are as follows: BP (!) 159/103 (BP Location: Right Arm, Patient Position: Sitting, Cuff Size: Large)   Pulse 82   SpO2 97%   She tells me that she had three cups of coffee immediately before coming into the practice.  She is going to closely monitor her BP and follow-up with her PCP again.  She just spoke with them about her elevated BP a couple of weeks ago.  Advised them to obtain an at-home automatic blood pressure cuff and  check their blood pressure at the same time each day and before any nicotine or caffeine containing products.

## 2022-03-21 ENCOUNTER — Ambulatory Visit (HOSPITAL_BASED_OUTPATIENT_CLINIC_OR_DEPARTMENT_OTHER)
Admission: RE | Admit: 2022-03-21 | Discharge: 2022-03-21 | Disposition: A | Discharge status: Home / Self Care | Payer: No Typology Code available for payment source | Source: Ambulatory Visit | Attending: Nurse Practitioner | Admitting: Nurse Practitioner

## 2022-03-21 ENCOUNTER — Ambulatory Visit (INDEPENDENT_AMBULATORY_CARE_PROVIDER_SITE_OTHER): Payer: No Typology Code available for payment source | Admitting: Physician Assistant

## 2022-03-21 VITALS — BP 159/103 | HR 82

## 2022-03-21 DIAGNOSIS — Y939 Activity, unspecified: Secondary | ICD-10-CM | POA: Insufficient documentation

## 2022-03-21 DIAGNOSIS — M549 Dorsalgia, unspecified: Secondary | ICD-10-CM

## 2022-03-21 DIAGNOSIS — Y929 Unspecified place or not applicable: Secondary | ICD-10-CM | POA: Insufficient documentation

## 2022-03-21 DIAGNOSIS — Z9889 Other specified postprocedural states: Secondary | ICD-10-CM

## 2022-03-22 ENCOUNTER — Encounter: Payer: Self-pay | Admitting: Physician Assistant

## 2022-03-22 DIAGNOSIS — Z719 Counseling, unspecified: Secondary | ICD-10-CM

## 2022-03-24 NOTE — Telephone Encounter (Signed)
No, it's all set. Her FMLA will arrive and when it does I will write her out until 12/1.

## 2022-03-26 ENCOUNTER — Ambulatory Visit: Payer: No Typology Code available for payment source | Admitting: Orthopedic Surgery

## 2022-03-26 NOTE — Telephone Encounter (Signed)
It's all taken care of, all set!

## 2022-03-28 ENCOUNTER — Telehealth: Payer: Self-pay | Admitting: Plastic Surgery

## 2022-03-28 NOTE — Telephone Encounter (Signed)
Please call her about bill that was sent to insurance.  Insurance is stating that it was billed a certain way that is why providers were not pay for.  She would like to discuss with you.

## 2022-03-31 ENCOUNTER — Encounter: Payer: Self-pay | Admitting: Nurse Practitioner

## 2022-03-31 ENCOUNTER — Ambulatory Visit (INDEPENDENT_AMBULATORY_CARE_PROVIDER_SITE_OTHER): Payer: No Typology Code available for payment source | Admitting: Nurse Practitioner

## 2022-03-31 VITALS — BP 122/80 | HR 74 | Wt 223.4 lb

## 2022-03-31 DIAGNOSIS — I7774 Dissection of vertebral artery: Secondary | ICD-10-CM | POA: Diagnosis not present

## 2022-03-31 DIAGNOSIS — I479 Paroxysmal tachycardia, unspecified: Secondary | ICD-10-CM

## 2022-03-31 DIAGNOSIS — R42 Dizziness and giddiness: Secondary | ICD-10-CM | POA: Diagnosis not present

## 2022-03-31 DIAGNOSIS — R002 Palpitations: Secondary | ICD-10-CM | POA: Diagnosis not present
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
## 2022-04-01 LAB — COMPREHENSIVE METABOLIC PANEL
ALT: 43 IU/L — ABNORMAL HIGH (ref 0–32)
AST: 32 IU/L (ref 0–40)
Albumin/Globulin Ratio: 1.8 (ref 1.2–2.2)
Albumin: 4.6 g/dL (ref 3.9–4.9)
Alkaline Phosphatase: 54 IU/L (ref 44–121)
BUN/Creatinine Ratio: 10 (ref 9–23)
BUN: 9 mg/dL (ref 6–24)
Bilirubin Total: 0.9 mg/dL (ref 0.0–1.2)
CO2: 23 mmol/L (ref 20–29)
Calcium: 9.7 mg/dL (ref 8.7–10.2)
Chloride: 102 mmol/L (ref 96–106)
Creatinine, Ser: 0.86 mg/dL (ref 0.57–1.00)
Globulin, Total: 2.5 g/dL (ref 1.5–4.5)
Glucose: 93 mg/dL (ref 70–99)
Potassium: 4.8 mmol/L (ref 3.5–5.2)
Sodium: 139 mmol/L (ref 134–144)
Total Protein: 7.1 g/dL (ref 6.0–8.5)
eGFR: 87 mL/min/{1.73_m2} (ref >59)

## 2022-04-01 LAB — CBC WITH DIFFERENTIAL/PLATELET
Basophils Absolute: 0.1 10*3/uL (ref 0.0–0.2)
Basos: 1 %
EOS (ABSOLUTE): 0.3 10*3/uL (ref 0.0–0.4)
Eos: 4 %
Hematocrit: 41.5 % (ref 34.0–46.6)
Hemoglobin: 14.1 g/dL (ref 11.1–15.9)
Immature Grans (Abs): 0 10*3/uL (ref 0.0–0.1)
Immature Granulocytes: 0 %
Lymphocytes Absolute: 2.6 10*3/uL (ref 0.7–3.1)
Lymphs: 38 %
MCH: 31.3 pg (ref 26.6–33.0)
MCHC: 34 g/dL (ref 31.5–35.7)
MCV: 92 fL (ref 79–97)
Monocytes Absolute: 0.4 10*3/uL (ref 0.1–0.9)
Monocytes: 6 %
Neutrophils Absolute: 3.4 10*3/uL (ref 1.4–7.0)
Neutrophils: 51 %
Platelets: 226 10*3/uL (ref 150–450)
RBC: 4.51 x10E6/uL (ref 3.77–5.28)
RDW: 12.1 % (ref 11.7–15.4)
WBC: 6.8 10*3/uL (ref 3.4–10.8)

## 2022-04-01 LAB — IRON,TIBC AND FERRITIN PANEL
Ferritin: 95 ng/mL (ref 15–150)
Iron Saturation: 24 % (ref 15–55)
Iron: 77 ug/dL (ref 27–159)
Total Iron Binding Capacity: 317 ug/dL (ref 250–450)
UIBC: 240 ug/dL (ref 131–425)

## 2022-04-01 LAB — B12 AND FOLATE PANEL
Folate: 9.1 ng/mL (ref >3.0)
Vitamin B-12: 351 pg/mL (ref 232–1245)

## 2022-04-01 LAB — VITAMIN D 25 HYDROXY (VIT D DEFICIENCY, FRACTURES): Vit D, 25-Hydroxy: 27.8 ng/mL — ABNORMAL LOW (ref 30.0–100.0)

## 2022-04-01 LAB — T4, FREE: Free T4: 1.13 ng/dL (ref 0.82–1.77)

## 2022-04-01 LAB — TSH: TSH: 1.94 u[IU]/mL (ref 0.450–4.500)

## 2022-04-02 MED ORDER — MECLIZINE HCL 25 MG PO TABS: 25.0000 mg | ORAL_TABLET | Freq: Three times a day (TID) | ORAL | 3 refills | Status: AC | PRN

## 2022-04-02 MED ORDER — DIAZEPAM 2 MG PO TABS: 2.0000 mg | ORAL_TABLET | Freq: Three times a day (TID) | ORAL | 0 refills | Status: DC | PRN

## 2022-04-07 ENCOUNTER — Ambulatory Visit (INDEPENDENT_AMBULATORY_CARE_PROVIDER_SITE_OTHER): Payer: No Typology Code available for payment source

## 2022-04-07 ENCOUNTER — Encounter: Payer: Self-pay | Admitting: Internal Medicine

## 2022-04-07 ENCOUNTER — Ambulatory Visit: Payer: No Typology Code available for payment source | Attending: Internal Medicine | Admitting: Internal Medicine

## 2022-04-07 VITALS — BP 140/100 | HR 76 | Ht 68.0 in | Wt 224.0 lb

## 2022-04-07 DIAGNOSIS — R002 Palpitations: Secondary | ICD-10-CM

## 2022-04-07 NOTE — Progress Notes (Unsigned)
Enrolled for [employer] to mail a ZIO XT long term holter monitor to the patients address on file.  

## 2022-04-07 NOTE — Progress Notes (Signed)
Cardiology Office Note:    Date:  04/07/2022   ID:  Elizabeth Franklin, DOB 30-Sep-1980, MRN [id number]  PCP:  Tollie EthEarly, Sara E, NP   [facility] Providers Cardiologist:  Maisie FusBranch, Kalyse Meharg E, MD     Referring MD: Tollie EthEarly, Sara E, NP   No chief complaint on file. Dizziness  History of Present Illness:    Elizabeth PitcherCeleste Franklin is a 41 y.o. female with a hx of referral for EKG. EKG 10/7 shows NSR with right axis. Normal R wave progression. She is having dizzy spells. Notes fast heart rate in clinic. She has noticed some heart racing. She's taking valium and flexeril.  TSH is normal. Normal iron. Hgb is normal. Notes some higher Bps in the past. She was taken off Bp meds 20 years ago. Father -MI, PGF- MI at age 41 and stroke. Brother-HTN, afib. No cardiac dx hx for her.  Past Medical History:  Diagnosis Date   Acute maxillary sinusitis 08/02/2020   Anxiety    Bilateral posterior neck pain 07/19/2020   Body mass index (BMI) 34.0-34.9, adult 07/19/2020   Cephalalgia 08/01/2020   Chronic headaches    Dermatitis 08/30/2020   Excessive or frequent menstruation 11/21/2013   Neck pain with history of cervical spinal surgery 08/01/2020   PONV (postoperative nausea and vomiting)    Screening mammogram for breast cancer 07/19/2020   Slow transit constipation 07/19/2020   Weight gain 07/19/2020    Past Surgical History:  Procedure Laterality Date   APPENDECTOMY     BREAST REDUCTION SURGERY Bilateral 02/27/2022   Procedure: BREAST REDUCTION WITH LIPOSUCTION;  Surgeon: Peggye Formillingham, Claire S, DO;  Location: [facility];  Service: Plastics;  Laterality: Bilateral;   CERVICAL DISC SURGERY     DILITATION & CURRETTAGE/HYSTROSCOPY WITH THERMACHOICE ABLATION N/A 01/30/2014   Procedure: DILATATION & CURETTAGE/HYSTEROSCOPY WITH THERMACHOICE ABLATION;  Surgeon: Lazaro ArmsLuther H Eure, MD;  Location: [facility];  Service: Gynecology;  Laterality: N/A;  Total Ablation Therapy Time 9 minutes 5 seconds; 86-88  deg C   ENDOMETRIAL ABLATION     LAPAROSCOPIC BILATERAL SALPINGECTOMY Bilateral 01/30/2014   Procedure: LAPAROSCOPIC BILATERAL SALPINGECTOMY;  Surgeon: Lazaro ArmsLuther H Eure, MD;  Location: [facility];  Service: Gynecology;  Laterality: Bilateral;    Current Medications: Current Meds  Medication Sig   betamethasone dipropionate (DIPROLENE) 0.05 % ointment Apply topically 2 (two) times daily. To eczema rash.   cyclobenzaprine (FLEXERIL) 10 MG tablet Take 1 tablet (10 mg total) by mouth 3 (three) times daily as needed for muscle spasms.   diazepam (VALIUM) 2 MG tablet Take 1 tablet (2 mg total) by mouth every 8 (eight) hours as needed for anxiety.   EPINEPHrine 0.3 mg/0.3 mL IJ SOAJ injection Inject 0.3 mg into the muscle as needed for anaphylaxis.   gabapentin (NEURONTIN) 100 MG capsule Take 1 capsule (100 mg total) by mouth 3 (three) times daily as needed.   ibuprofen (ADVIL) 800 MG tablet Take 1 tablet (800 mg total) by mouth every 8 (eight) hours as needed.   influenza vac split quadrivalent PF (FLUARIX) 0.5 ML injection Inject into the muscle.   meclizine (ANTIVERT) 25 MG tablet Take 1 tablet (25 mg total) by mouth 3 (three) times daily as needed for dizziness or nausea.   ondansetron (ZOFRAN-ODT) 4 MG disintegrating tablet Take 1 tablet (4 mg total) by mouth every 8 (eight) hours as needed for up to 20 doses for nausea or vomiting.   rizatriptan (MAXALT) 10 MG tablet Take 1 tablet (10 mg total)  by mouth as needed for migraine. May repeat in 2 hours if needed     Allergies:   Bee venom and Codeine   Social History   Socioeconomic History   Marital status: Married    Spouse name: Susette Racer   Number of children: 1   Years of education: Not on file   Highest education level: Not on file  Occupational History   Not on file  Tobacco Use   Smoking status: Former    Packs/day: 0.25    Years: 1.00    Total pack years: 0.25    Types: Cigarettes    Quit date: 01/26/1999    Years since  quitting: 23.2   Smokeless tobacco: Never  Vaping Use   Vaping Use: Never used  Substance and Sexual Activity   Alcohol use: Yes    Comment: occassional   Drug use: No   Sexual activity: Yes    Partners: Male    Birth control/protection: Surgical    Comment: monogomous relationship  Other Topics Concern   Not on file  Social History Narrative   Not on file   Social Determinants of Health   Financial Resource Strain: Not on file  Food Insecurity: Not on file  Transportation Needs: Not on file  Physical Activity: Inactive (07/19/2020)   Exercise Vital Sign    Days of Exercise per Week: 0 days    Minutes of Exercise per Session: 0 min  Stress: Not on file  Social Connections: Not on file     Family History: The patient's family history includes Breast cancer (age of onset: 12) in her paternal aunt and paternal grandmother; Diabetes in her brother and paternal grandmother; Heart disease in her father and paternal grandmother; Heart failure in her father; Hyperlipidemia in her brother; Hypertension in her father and mother; Mental illness in her mother.  ROS:   Please see the history of present illness.     All other systems reviewed and are negative.  EKGs/Labs/Other Studies Reviewed:    The following studies were reviewed today:   EKG:  EKG is  ordered today.  The ekg ordered today demonstrates   04/07/2022- NSR, IRBBB  Recent Labs: 03/31/2022: ALT 43; BUN 9; Creatinine, Ser 0.86; Hemoglobin 14.1; Platelets 226; Potassium 4.8; Sodium 139; TSH 1.940  Recent Lipid Panel    Component Value Date/Time   CHOL 177 07/19/2020 1507   TRIG 45 07/19/2020 1507   HDL 71 07/19/2020 1507   CHOLHDL 2.5 07/19/2020 1507   VLDL 9 07/19/2020 1507   LDLCALC 97 07/19/2020 1507     Risk Assessment/Calculations:         Physical Exam:    VS: Vitals:   04/07/22 1402  BP: (!) 140/100  Pulse: 76    Wt Readings from Last 3 Encounters:  04/07/22 224 lb (101.6 kg)  03/31/22  223 lb 6.4 oz (101.3 kg)  03/06/22 217 lb (98.4 kg)     GEN:  Well nourished, well developed in no acute distress HEENT: Normal NECK: No JVD; No carotid bruits LYMPHATICS: No lymphadenopathy CARDIAC: RRR, no murmurs, rubs, gallops RESPIRATORY:  Clear to auscultation without rales, wheezing or rhonchi  ABDOMEN: Soft, non-tender, non-distended MUSCULOSKELETAL:  No edema; No deformity  SKIN: Warm and dry NEUROLOGIC:  Alert and oriented x 3 PSYCHIATRIC:  Normal affect   ASSESSMENT:    Palpitations: Symptoms are not high risk for a concerning arrhythmia. TSH is normal. She has a brother with afib, with get a ziopatch with  recurrent symptoms. 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  Elevated BP: recommend home measurements, will assess home readings to determine if initiation of therapy is warranted. Can start if needed, this can be followed by her PCP PLAN:    In order of problems listed above:  7 day ziopatch Follow up if abnormal           Medication Adjustments/Labs and Tests Ordered: Current medicines are reviewed at length with the patient today.  Concerns regarding medicines are outlined above.  Orders Placed This Encounter  Procedures   LONG TERM MONITOR (3-14 DAYS)   EKG 12-Lead   No orders of the defined types were placed in this encounter.   Patient Instructions  Medication Instructions:  No Changes In Medications at this time.  *If you need a refill on your cardiac medications before your next appointment, please call your pharmacy*  Lab Work: None Ordered At This Time.  If you have labs (blood work) drawn today and your tests are completely normal, you will receive your results only by: MyChart Message (if you have MyChart) OR A paper copy in the mail If you have any lab test that is abnormal or we need to change your treatment, we will call you to review the results.  Testing/Procedures:  Christena Deem- Long Term Monitor Instructions   Your  physician has requested you wear your ZIO patch monitor___7____days.   This is a single patch monitor.  [employer] supplies one patch monitor per enrollment.  Additional stickers are not available.   Please do not apply patch if you will be having a Nuclear Stress Test, Echocardiogram, Cardiac CT, MRI, or Chest Xray during the time frame you would be wearing the monitor. The patch cannot be worn during these tests.  You cannot remove and re-apply the ZIO XT patch monitor.   Your ZIO patch monitor will be sent USPS Priority mail from [facility] directly to your home address. The monitor may also be mailed to a PO BOX if home delivery is not available.   It may take 3-5 days to receive your monitor after you have been enrolled.   Once you have received you monitor, please review enclosed instructions.  Your monitor has already been registered assigning a specific monitor serial # to you.   Applying the monitor   Shave hair from upper left chest.   Hold abrader disc by orange tab.  Rub abrader in 40 strokes over left upper chest as indicated in your monitor instructions.   Clean area with 4 enclosed alcohol pads .  Use all pads to assure are is cleaned thoroughly.  Let dry.   Apply patch as indicated in monitor instructions.  Patch will be place under collarbone on left side of chest with arrow pointing upward.   Rub patch adhesive wings for 2 minutes.Remove white label marked "1".  Remove white label marked "2".  Rub patch adhesive wings for 2 additional minutes.   While looking in a mirror, press and release button in center of patch.  A small green light will flash 3-4 times .  This will be your only indicator the monitor has been turned on.     Do not shower for the first 24 hours.  You may shower after the first 24 hours.   Press button if you feel a symptom. You will hear a small click.  Record Date, Time and Symptom in the Patient Log Book.   When you are ready  to remove patch,  follow instructions on last 2 pages of Patient Log Book.  Stick patch monitor onto last page of Patient Log Book.   Place Patient Log Book in [place] box.  Use locking tab on box and tape box closed securely.  The [employer] has [employer] on it.  Please place in mailbox as soon as possible.  Your physician should have your test results approximately 7 days after the monitor has been mailed back to [facility].   Call [facility] Customer Care at [phone] if you have questions regarding your ZIO XT patch monitor.  Call them immediately if you see an orange light blinking on your monitor.   If your monitor falls off in less than 4 days contact our Monitor department at [phone].  If your monitor becomes loose or falls off after 4 days call [employer] at [phone] for suggestions on securing your monitor.    Follow-Up: At [facility], you and your health needs are our priority.  As part of our continuing mission to provide you with exceptional heart care, we have created designated Provider Care Teams.  These Care Teams include your primary Cardiologist (physician) and Advanced Practice Providers (APPs -  Physician Assistants and Nurse Practitioners) who all work together to provide you with the care you need, when you need it.  Your next appointment:   AS NEEDED   The format for your next appointment:   In Person  Provider:   Maisie Fus, MD    PLEASE CHECK YOUR BLOOD PRESSURE ONCE DAILY FOR 7 DAYS WHILE WEARING THE MONITOR- WRITE THIS DOWN AND SEND BACK TO [place] VIA MYCHART          Signed, Maisie Fus, MD  04/07/2022 2:28 PM    [facility]

## 2022-04-07 NOTE — Patient Instructions (Signed)
Medication Instructions:  No Changes In Medications at this time.  *If you need a refill on your cardiac medications before your next appointment, please call your pharmacy*  Lab Work: None Ordered At This Time.  If you have labs (blood work) drawn today and your tests are completely normal, you will receive your results only by: MyChart Message (if you have MyChart) OR A paper copy in the mail If you have any lab test that is abnormal or we need to change your treatment, we will call you to review the results.  Testing/Procedures:  ZIO XT- Long Term Monitor Instructions   Your physician has requested you wear your ZIO patch monitor___7____days.   This is a single patch monitor.  [employer] supplies one patch monitor per enrollment.  Additional stickers are not available.   Please do not apply patch if you will be having a Nuclear Stress Test, Echocardiogram, Cardiac CT, MRI, or Chest Xray during the time frame you would be wearing the monitor. The patch cannot be worn during these tests.  You cannot remove and re-apply the ZIO XT patch monitor.   Your ZIO patch monitor will be sent USPS Priority mail from [employer] directly to your home address. The monitor may also be mailed to a PO BOX if home delivery is not available.   It may take 3-5 days to receive your monitor after you have been enrolled.   Once you have received you monitor, please review enclosed instructions.  Your monitor has already been registered assigning a specific monitor serial # to you.   Applying the monitor   Shave hair from upper left chest.   Hold abrader disc by orange tab.  Rub abrader in 40 strokes over left upper chest as indicated in your monitor instructions.   Clean area with 4 enclosed alcohol pads .  Use all pads to assure are is cleaned thoroughly.  Let dry.   Apply patch as indicated in monitor instructions.  Patch will be place under collarbone on left side of chest with arrow pointing  upward.   Rub patch adhesive wings for 2 minutes.Remove white label marked "1".  Remove white label marked "2".  Rub patch adhesive wings for 2 additional minutes.   While looking in a mirror, press and release button in center of patch.  A small green light will flash 3-4 times .  This will be your only indicator the monitor has been turned on.     Do not shower for the first 24 hours.  You may shower after the first 24 hours.   Press button if you feel a symptom. You will hear a small click.  Record Date, Time and Symptom in the Patient Log Book.   When you are ready to remove patch, follow instructions on last 2 pages of Patient Log Book.  Stick patch monitor onto last page of Patient Log Book.   Place Patient Log Book in [place] box.  Use locking tab on box and tape box closed securely.  The [employer] box has prepaid postage on it.  Please place in mailbox as soon as possible.  Your physician should have your test results approximately 7 days after the monitor has been mailed back to [employer].   Call [employer] Customer Care at [phone] if you have questions regarding your ZIO XT patch monitor.  Call them immediately if you see an orange light blinking on your monitor.   If your monitor falls off in less   than 4 days contact our Monitor department at [phone].  If your monitor becomes loose or falls off after 4 days call [employer] at [phone] for suggestions on securing your monitor.    Follow-Up: At [facility], you and your health needs are our priority.  As part of our continuing mission to provide you with exceptional heart care, we have created designated Provider Care Teams.  These Care Teams include your primary Cardiologist (physician) and Advanced Practice Providers (APPs -  Physician Assistants and Nurse Practitioners) who all work together to provide you with the care you need, when you need it.  Your next appointment:   AS NEEDED   The  format for your next appointment:   In Person  Provider:   Maisie Fus, MD    PLEASE CHECK YOUR BLOOD PRESSURE ONCE DAILY FOR 7 DAYS WHILE WEARING THE MONITOR- WRITE THIS DOWN AND SEND BACK TO [place] VIA [facility]

## 2022-04-11 ENCOUNTER — Encounter: Payer: Self-pay | Admitting: Student

## 2022-04-11 ENCOUNTER — Ambulatory Visit (INDEPENDENT_AMBULATORY_CARE_PROVIDER_SITE_OTHER): Payer: No Typology Code available for payment source | Admitting: Student

## 2022-04-11 VITALS — BP 161/110 | HR 82

## 2022-04-11 DIAGNOSIS — Z9889 Other specified postprocedural states: Secondary | ICD-10-CM

## 2022-04-11 DIAGNOSIS — R002 Palpitations: Secondary | ICD-10-CM

## 2022-04-12 NOTE — Progress Notes (Signed)
Patient is a 41 year old female who underwent bilateral breast reduction with liposuction on 02/27/2022 with Dr. Ulice Bold.  She presents to the clinic today for postoperative follow-up.  Patient was last seen in the clinic on 03/21/2022.  At this visit, she reported she was doing well.  Physical exam was entirely reassuring at this visit.  Breasts are soft.  NAC's are viable.  There is a small one by one superficial wound at the left inferior T-zone.  Plan is for patient to follow-up in 3 weeks.  Today, patient reports she is doing okay.  She states that she recently has been experiencing episodes of dizziness.  She states that she has been following up with her primary care provider regarding this.  She reports that she is now wearing a heart monitor and has been referred to cardiology as well.  Patient also states she has been following up with her PCP regarding her high blood pressure.  Patient states that from a surgical standpoint, she is having difficulty with range of motion to her left upper extremity.  She feels that when she lifts her arm, she feels tightness to the lateral aspect of her left chest wall and abdomen.  Patient states that this area is also tender when she touches it.  She states that it is nonpainful when she does not touch it.  Chaperone present on exam.  On exam, patient is sitting upright in no acute distress.  Breasts are soft and symmetric bilaterally.  There is no overlying erythema or skin changes.  NAC's are viable bilaterally.  There are no significant fluid collections palpated on exam.  Incisions are intact and healing well.  There are no wounds noted on exam.  To the left inframammary incision, there appears to be a small bump, which appears to be consistent with a suture underneath the skin.  There is no surrounding erythema.  There is no drainage.  Patient has limited range of motion of her left upper extremity on exam.  There is mild tenderness to palpation to the left  chest wall and anterior abdomen.  There is some minor swelling it appears in this area.  There is no bruising noted.  There are no overlying skin changes.  I discussed with the patient that it is not clear exactly what is causing her limited range of motion and the tenderness to the left chest wall and abdomen.  I discussed with the patient that it could be some scarring/tightening from surgery possibly in combination with underuse of the left upper extremity since surgery.  I discussed with the patient that there are no signs of infection on exam.  I discussed with the patient that she should follow-up with her primary care provider regarding this though, to rule out anything else that may be going on.  I discussed with the patient that in the meantime, she should start physical therapy to help with the range of motion to her left upper extremity.  Patient expressed understanding.  I discussed with the patient that if the pain /tenderness were to worsen she needs to let us know or go to the emergency room to be evaluated.  Patient expressed understanding.  Order for PT evaluation has been placed.  I discussed with the patient that she may take Tylenol or ibuprofen for pain.  I would like the patient to come back next week for reevaluation.  Pictures were obtained of the patient and placed in the chart with the patient's or guardian's permission.  I instructed the patient in the meantime to call if she has any questions or concerns.   Patient's vital signs for today's encounter are as follows: BP (!) 161/110 (BP Location: Left Arm, Patient Position: Sitting, Cuff Size: Large) Comment: pt is currently wearing a Zio Patch to monitor heart in regards to elevated blood pressure  Pulse 82   SpO2 100%   Patient is encouraged to follow-up with their primary care provider for evaluation and management of their elevated blood pressure.

## 2022-04-16 NOTE — Progress Notes (Unsigned)
Patient is a 41 year old female who underwent bilateral breast reduction with liposuction on 02/27/2022 Dr. Ulice Bold.  Patient presents to the clinic today for follow-up.  Patient was last seen in the clinic on 04/11/2022.  At this visit, she reported she was doing okay.  She reported that she had been experiencing episodes of dizziness which she had been following up with her primary care provider and cardiology for.  Patient reported she was also having difficulty with range of motion to her left upper extremity.  She feels that there is a tightness to the lateral aspect of her left chest wall and abdomen when she lifts her arm.  She reports it was slightly tender upon palpation.  On exam, breasts were soft and symmetric bilaterally.  There is no overlying erythema or skin changes.  NAC's were viable bilaterally.  There were no wounds noted on exam.  Patient appeared to have some limited range of motion of her left upper extremity.  There is mild tenderness to palpation to the left chest wall and anterior abdomen.  There is no bruising noted.  Plan was for patient to start physical therapy for her left upper extremity, but I encouraged the patient to follow-up with her primary care provider as well in regard to this.  Plan is for patient to follow-up in 1 week.  Today,

## 2022-04-17 ENCOUNTER — Ambulatory Visit (INDEPENDENT_AMBULATORY_CARE_PROVIDER_SITE_OTHER): Payer: No Typology Code available for payment source | Admitting: Physician Assistant

## 2022-04-17 ENCOUNTER — Encounter: Payer: Self-pay | Admitting: Plastic Surgery

## 2022-04-17 ENCOUNTER — Encounter: Payer: Self-pay | Admitting: Physician Assistant

## 2022-04-17 VITALS — BP 137/89 | HR 84

## 2022-04-17 DIAGNOSIS — Z9889 Other specified postprocedural states: Secondary | ICD-10-CM

## 2022-04-17 DIAGNOSIS — Z719 Counseling, unspecified: Secondary | ICD-10-CM

## 2022-04-17 NOTE — Progress Notes (Signed)
Patient is a very pleasant 41 year old female with PMH of macromastia s/p bilateral breast reduction with liposuction performed 02/27/2022 by Dr. Ulice Bold who presents to clinic for postoperative follow-up.   Patient was last seen here in clinic on 04/11/2022.  At that time, she reported she was doing okay.  She reported that she had been experiencing episodes of dizziness which she had been for which she has been seeing primary care and cardiology.  Patient reported she was also having difficulty with range of motion to her left upper extremity.  She feels that there is a tightness to the lateral aspect of her left chest wall and abdomen when she lifts her arm.  She reports it was slightly tender upon palpation.  On exam, breasts were soft and symmetric bilaterally.  There is no overlying erythema or skin changes.  NAC's were viable bilaterally.  There were no wounds noted on exam.  Patient appeared to have some limited range of motion of her left upper extremity.  There is mild tenderness to palpation to the left chest wall and anterior abdomen.  There is no bruising noted.  Plan was for patient to start physical therapy for her left upper extremity, but I encouraged the patient to follow-up with her primary care provider as well in regard to this.  Plan is for patient to follow-up in 1 week.  Today, patient is doing well.  She states that her range of motion was predominantly limited due to pain, particular with any overhead movement.  She states that this has improved considerably and she is seeing improved range of motion.  She debates really not she would even benefit from proceeding with PT, but advised that she at least go for a single session.  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  From a surgical standpoint, she states that she is doing quite well.  She is overall very satisfied  with the outcome of her reduction.  She reported almost immediate improvement in her chronic upper back and neck discomfort due to her macromastia.  Physical exam is entirely reassuring.  Breasts with excellent shape and symmetry.  Incisions all CDI.  No obvious seromas or hematomas noted.  Possible small area of fat necrosis over most lateral aspect of left breast towards inframammary incision.  No fluctuance or crepitus.  Recommended gentle massage of the small area of possible fat necrosis over lateral aspect of left breast.  Otherwise, her exam is entirely benign.  She can begin using scar treatments.  Photos were obtained at previous appointment, no need to repeat.  No specific follow-up needed.  Encouraged her to go to at least 1 PT session to give her appropriate exercises for mitigating her discomfort and increasing ROM.  Clear muscular component, doubt other etiology of her symptoms.

## 2022-04-18 ENCOUNTER — Other Ambulatory Visit: Payer: Self-pay | Admitting: Nurse Practitioner

## 2022-04-18 DIAGNOSIS — E559 Vitamin D deficiency, unspecified: Secondary | ICD-10-CM

## 2022-04-18 MED ORDER — VITAMIN D (ERGOCALCIFEROL) 1.25 MG (50000 UNIT) PO CAPS
50000.0000 [IU] | ORAL_CAPSULE | ORAL | 1 refills | Status: DC
  Filled 2022-04-18: qty 12, 84d supply, fill #0

## 2022-04-19 ENCOUNTER — Other Ambulatory Visit (HOSPITAL_BASED_OUTPATIENT_CLINIC_OR_DEPARTMENT_OTHER): Payer: Self-pay

## 2022-04-19 ENCOUNTER — Other Ambulatory Visit: Payer: Self-pay

## 2022-04-24 ENCOUNTER — Encounter: Payer: Self-pay | Admitting: Internal Medicine

## 2022-04-24 DIAGNOSIS — R42 Dizziness and giddiness: Secondary | ICD-10-CM | POA: Insufficient documentation

## 2022-04-24 NOTE — Assessment & Plan Note (Addendum)
Intermittent dizziness of unknown etiology. No neurologic concerns present on examination. No signs of URI. Consider possible vestibular nature. Given her extensive history, I would like to ensure that these are not cardiac related. No alarm symptoms present at this time, but we will monitor very closely. Her BP is stable today, unclear if the dizziness is related to intermittent elevations in BP. We can consider BP medication to see if this is helpful for symptom management. Will wait for cardiology evaluation and make final recommendation based on their assessment unless symptoms change or worsen.

## 2022-04-24 NOTE — Assessment & Plan Note (Signed)
Historical diagnosis with symptoms of headache and neck pain. No symptoms of this at this time. Dizziness appears to be correlating with vertigo. She is not having any neck pain, vision changes, or other associated symptoms that correlate with her previous symptoms. We will continue to monitor closely and have low threshold for imaging. At this time would like to avoid additional radiation given she has had several imaging studies in the recent past. She understands to follow-up if any new or worsening symptoms present.

## 2022-04-24 NOTE — Assessment & Plan Note (Signed)
Palpitations intermittently, possibly related to previous diagnosis of paroxysmal tachycardia. Dizziness does not appear to be associated, however, I do feel that cardiology evaluation is appropriate given her history and age. We will send referral today for further evaluation and recommendations. No alarm sx present today. HRRR.

## 2022-05-01 ENCOUNTER — Other Ambulatory Visit (HOSPITAL_BASED_OUTPATIENT_CLINIC_OR_DEPARTMENT_OTHER): Payer: Self-pay

## 2022-05-01 ENCOUNTER — Ambulatory Visit (INDEPENDENT_AMBULATORY_CARE_PROVIDER_SITE_OTHER): Payer: No Typology Code available for payment source | Admitting: Nurse Practitioner

## 2022-05-01 ENCOUNTER — Encounter: Payer: Self-pay | Admitting: Nurse Practitioner

## 2022-05-01 VITALS — BP 144/100 | HR 79 | Wt 227.2 lb

## 2022-05-01 DIAGNOSIS — F411 Generalized anxiety disorder: Secondary | ICD-10-CM | POA: Diagnosis not present

## 2022-05-01 DIAGNOSIS — F5101 Primary insomnia: Secondary | ICD-10-CM | POA: Diagnosis not present

## 2022-05-01 MED ORDER — HYDROXYZINE HCL 25 MG PO TABS
25.0000 mg | ORAL_TABLET | Freq: Every evening | ORAL | 11 refills | Status: DC | PRN
  Filled 2022-05-01: qty 60, 30d supply, fill #0
  Filled 2022-05-27: qty 60, 30d supply, fill #1

## 2022-05-01 MED ORDER — ESCITALOPRAM OXALATE 10 MG PO TABS
10.0000 mg | ORAL_TABLET | Freq: Every day | ORAL | 3 refills | Status: DC
  Filled 2022-05-01: qty 30, 30d supply, fill #0
  Filled 2022-05-27: qty 30, 30d supply, fill #1
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
## 2022-05-01 NOTE — Patient Instructions (Signed)
I have sent in the lexapro for you to try. Give this about 2-4 weeks and let me know how you are feeling.   I have sent in the hydroxyzine for you to try for sleep and for anxiety at night.

## 2022-05-12 NOTE — Assessment & Plan Note (Signed)
Symptoms and presentation consistent with anxiety.  She has been through quite a bit of personal and professional stress and anxiety over the past 1 and half to 2 years which are likely contributing to this change.  We discussed the options of medication with her today.  She is interested in trialing medication that may be helpful.  Joint decision today to begin Lexapro 10 mg daily and monitoring closely.  We will also add hydroxyzine to see if this is helpful for insomnia related to her anxiety as well.  There are no alarm symptoms present at this time.  I have encouraged her to monitor closely for any new or worsening symptoms and follow-up immediately.  We will plan to check in and see how she is doing on the medication in approximately 4 weeks or sooner as needed.

## 2022-06-12 ENCOUNTER — Telehealth: Payer: 59 | Admitting: Nurse Practitioner

## 2022-06-13 ENCOUNTER — Telehealth (INDEPENDENT_AMBULATORY_CARE_PROVIDER_SITE_OTHER): Payer: 59 | Admitting: Nurse Practitioner

## 2022-06-13 ENCOUNTER — Encounter: Payer: Self-pay | Admitting: Nurse Practitioner

## 2022-06-13 ENCOUNTER — Other Ambulatory Visit (HOSPITAL_BASED_OUTPATIENT_CLINIC_OR_DEPARTMENT_OTHER): Payer: Self-pay

## 2022-06-13 DIAGNOSIS — L308 Other specified dermatitis: Secondary | ICD-10-CM

## 2022-06-13 DIAGNOSIS — F5101 Primary insomnia: Secondary | ICD-10-CM

## 2022-06-13 DIAGNOSIS — F411 Generalized anxiety disorder: Secondary | ICD-10-CM | POA: Diagnosis not present

## 2022-06-13 MED ORDER — BETAMETHASONE DIPROPIONATE 0.05 % EX OINT
TOPICAL_OINTMENT | Freq: Two times a day (BID) | CUTANEOUS | 3 refills | Status: AC
  Filled 2022-06-13: qty 45, 30d supply, fill #0

## 2022-06-13 MED ORDER — ESCITALOPRAM OXALATE 10 MG PO TABS
10.0000 mg | ORAL_TABLET | Freq: Every day | ORAL | 3 refills | Status: DC
  Filled 2022-06-13 – 2022-06-27 (×2): qty 90, 90d supply, fill #0
  Filled 2022-09-23: qty 90, 90d supply, fill #1

## 2022-06-13 NOTE — Progress Notes (Signed)
Virtual Visit Encounter mychart visit.   I connected with  Elizabeth Franklin on 06/21/22 at  4:15 PM EST by secure video and audio telemedicine application. I verified that I am speaking with the correct person using two identifiers.   I introduced myself as a Designer, jewellery with the practice. The limitations of evaluation and management by telemedicine discussed with the patient and the availability of in person appointments. The patient expressed verbal understanding and consent to proceed.  Participating parties in this visit include: Myself and patient  The patient is: Patient Location: Home I am: Provider Location: Office/Clinic Subjective:    CC and HPI: Elizabeth Franklin is a 42 y.o. year old female presenting for follow up of anxiety. Patient reports the following: Anxiety  Elizabeth Franklin tells me that her anxiety is significantly improved since starting the lexapro a few weeks ago. She feels that she is better able to manage her anxiety symptoms and feels generally calmer and able to function. At our last meeting she was struggling with issues at work. She tells me she has been able to work through many of the issues she was dealing with and things have improved. She tells me she is sleeping well and is not having any panic symptoms. She would like to stay on the medication at this time.   She also reports flaking and itching on her eyelids with redness and scaling. She has used steroid cream in the past with success.    Past medical history, Surgical history, Family history not pertinant except as noted below, Social history, Allergies, and medications have been entered into the medical record, reviewed, and corrections made.   Review of Systems:  All review of systems negative except what is listed in the HPI  Objective:    Alert and oriented x 4 Speaking in clear sentences with no shortness of breath. No distress.  Impression and Recommendations:    Problem List Items  Addressed This Visit     Eczema    Symptoms consistent with eczema reaction. She has had this in the past and successful treatment with short term betamethasone. Will send this treatment in for her again. Advised to use very light layer and use for no more than 7 days at a time. She will let me know if this is not effective.       Relevant Medications   betamethasone dipropionate (DIPROLENE) 0.05 % ointment   Generalized anxiety disorder    Anxiety well controlled at this time with lexapro 52m. Based on her response, we will plan to keep her on this dose at this time and continue to monitor. Refills provided today. She will contact me if she feels like the medication is not working as well for her or if her symptoms worsen in the future.       Relevant Medications   escitalopram (LEXAPRO) 10 MG tablet   Primary insomnia    She is sleeping very well now on the lexapro with no concerns for nighttime awakening. She has PRN hydroxyzine available for use if she finds it difficult to fall asleep due to anxiety. Will continue to monitor.       Relevant Medications   escitalopram (LEXAPRO) 10 MG tablet    current treatment plan is effective, no change in therapy, orders and follow up as documented in EMR I discussed the assessment and treatment plan with the patient. The patient was provided an opportunity to ask questions and all were answered. The patient agreed with the  plan and demonstrated an understanding of the instructions.   The patient was advised to call back or seek an in-person evaluation if the symptoms worsen or if the condition fails to improve as anticipated.  Follow-Up: in 6 months  I provided 22 minutes of non-face-to-face interaction with this non face-to-face encounter including intake, same-day documentation, and chart review.   Orma Render, NP , DNP, AGNP-c [facility]

## 2022-06-21 NOTE — Assessment & Plan Note (Signed)
Anxiety well controlled at this time with lexapro 96m. Based on her response, we will plan to keep her on this dose at this time and continue to monitor. Refills provided today. She will contact me if she feels like the medication is not working as well for her or if her symptoms worsen in the future.

## 2022-06-21 NOTE — Assessment & Plan Note (Signed)
Symptoms consistent with eczema reaction. She has had this in the past and successful treatment with short term betamethasone. Will send this treatment in for her again. Advised to use very light layer and use for no more than 7 days at a time. She will let me know if this is not effective.

## 2022-06-21 NOTE — Assessment & Plan Note (Signed)
She is sleeping very well now on the lexapro with no concerns for nighttime awakening. She has PRN hydroxyzine available for use if she finds it difficult to fall asleep due to anxiety. Will continue to monitor.

## 2022-06-27 ENCOUNTER — Other Ambulatory Visit (HOSPITAL_BASED_OUTPATIENT_CLINIC_OR_DEPARTMENT_OTHER): Payer: Self-pay

## 2022-06-27 ENCOUNTER — Other Ambulatory Visit: Payer: Self-pay

## 2022-08-21 ENCOUNTER — Encounter: Payer: Self-pay | Admitting: Nurse Practitioner

## 2022-08-21 ENCOUNTER — Other Ambulatory Visit (HOSPITAL_BASED_OUTPATIENT_CLINIC_OR_DEPARTMENT_OTHER): Payer: Self-pay

## 2022-08-21 ENCOUNTER — Ambulatory Visit (INDEPENDENT_AMBULATORY_CARE_PROVIDER_SITE_OTHER): Payer: 59 | Admitting: Nurse Practitioner

## 2022-08-21 VITALS — BP 124/74 | HR 78

## 2022-08-21 DIAGNOSIS — R635 Abnormal weight gain: Secondary | ICD-10-CM | POA: Diagnosis not present

## 2022-08-21 DIAGNOSIS — R17 Unspecified jaundice: Secondary | ICD-10-CM

## 2022-08-21 DIAGNOSIS — N926 Irregular menstruation, unspecified: Secondary | ICD-10-CM | POA: Diagnosis not present

## 2022-08-21 DIAGNOSIS — R601 Generalized edema: Secondary | ICD-10-CM

## 2022-08-21 DIAGNOSIS — Z0000 Encounter for general adult medical examination without abnormal findings: Secondary | ICD-10-CM | POA: Diagnosis not present

## 2022-08-21 DIAGNOSIS — F411 Generalized anxiety disorder: Secondary | ICD-10-CM | POA: Diagnosis not present

## 2022-08-21 MED ORDER — FUROSEMIDE 20 MG PO TABS
20.0000 mg | ORAL_TABLET | Freq: Every day | ORAL | 3 refills | Status: DC
  Filled 2022-08-21: qty 30, 30d supply, fill #0
  Filled 2022-09-23: qty 30, 30d supply, fill #1
  Filled 2022-11-01: qty 30, 30d supply, fill #2
  Filled 2023-02-09: qty 30, 30d supply, fill #3

## 2022-08-21 NOTE — Progress Notes (Signed)
Shawna Clamp, DNP, AGNP-c [facility] [address] [place], [place] [address] Main Office [phone]  BP 124/74   Pulse 78    Subjective:    Patient ID: Elizabeth Franklin, female    DOB: [birth year], 42 y.o.   MRN: [id number]  HPI: Elizabeth Franklin is a 42 y.o. female presenting on 08/21/2022 for comprehensive medical examination.   Raeghan presents today with chief complaints of feeling swollen all over every day, with puffy legs and tight rings on her hands. She reports gaining weight despite intentionally increasing her daily step count to between 10,000 and 15,000 steps and making no significant changes in her habits.  The patient also experiences reduction of her occipital neuralgia headaches, which she believes may be related to reduced pressure on her breasts after undergoing breast reduction surgery. She mentions having sporadic periods and experiencing hot flashes, raising concerns about the possibility of Jory Tanguma menopause.  Currently, the patient is taking Lexapro and occasionally uses Flexeril for muscle pain after excessive physical activity. She has been making efforts to eat healthier meals and snacks throughout the day but acknowledges that her eating habits have not significantly changed over time.   IMMUNIZATIONS:   Flu: Flu vaccine completed elsewhere this season Prevnar 13: Prevnar 13 N/A for this patient Prevnar 20: Prevnar 20 N/A for this patient Pneumovax 23: Pneumovax 23 N/A for this patient Vac Shingrix: Shingrix N/A for this patient HPV: HPV N/A for this patient Tetanus: Tetanus completed in the last 10 years  HEALTH MAINTENANCE: Pap Smear HM Status: is up to date Mammogram HM Status: is up to date Colon Cancer Screening HM Status: is not applicable for this patient Bone Density HM Status: is not applicable for this patient STI Testing HM Status: was declined   She reports regular vision exams q1-5y: Yes  She reports regular dental  exams q 20m:  Yes  The patient eats a regular, healthy diet. 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  Most Recent Depression Screen:     08/21/2022    2:56 PM 08/14/2021    8:36 AM 07/19/2020    3:39 PM 10/09/2014    8:11 AM  Depression screen PHQ 2/9  Decreased Interest 0 0 0 0  Down, Depressed, Hopeless 0 0 0 0  PHQ - 2 Score 0 0 0 0  Altered sleeping   3   Tired, decreased energy   2   Change in appetite   0   Feeling bad or failure about yourself    0   Trouble concentrating   2   Moving slowly or fidgety/restless   2   Suicidal thoughts   0   PHQ-9 Score   9   Difficult doing work/chores   Somewhat difficult    Most Recent Anxiety Screen:     07/19/2020    3:40 PM  GAD 7 : Generalized Anxiety Score  Nervous, Anxious, on Edge 1  Control/stop worrying 1  Worry too much - different things 1  Trouble relaxing 1  Restless 1  Easily annoyed or irritable 1  Afraid - awful might happen 0  Total GAD 7 Score 6  Anxiety Difficulty Somewhat difficult   Most Recent Fall Screen:    08/21/2022    2:55 PM 08/14/2021    8:35 AM 07/19/2020  3:39 PM  Fall Risk   Falls in the past year? 0 0 0  Number falls in past yr: 0 0 0  Injury with Fall? 0 0   Risk for fall due to : No Fall Risks No Fall Risks No Fall Risks  Follow up Falls evaluation completed Falls evaluation completed;Education provided Falls evaluation completed    Past medical history, surgical history, medications, allergies, family history and social history reviewed with patient today and changes made to appropriate areas of the chart.  Past Medical History:  Past Medical History:  Diagnosis Date   Acute maxillary sinusitis 08/02/2020   Anxiety    Bilateral posterior neck pain 07/19/2020   Body mass index (BMI)  34.0-34.9, adult 07/19/2020   Cephalalgia 08/01/2020   Chronic headaches    Dermatitis 08/30/2020   Excessive or frequent menstruation 11/21/2013   Neck pain with history of cervical spinal surgery 08/01/2020   PONV (postoperative nausea and vomiting)    Screening mammogram for breast cancer 07/19/2020   Slow transit constipation 07/19/2020   Weight gain 07/19/2020   Medications:  Current Outpatient Medications on File Prior to Visit  Medication Sig   betamethasone dipropionate (DIPROLENE) 0.05 % ointment Apply topically 2 (two) times daily. To eczema rash.   cyclobenzaprine (FLEXERIL) 10 MG tablet Take 1 tablet (10 mg total) by mouth 3 (three) times daily as needed for muscle spasms.   escitalopram (LEXAPRO) 10 MG tablet Take 1 tablet (10 mg total) by mouth daily.   rizatriptan (MAXALT) 10 MG tablet Take 1 tablet (10 mg total) by mouth as needed for migraine. May repeat in 2 hours if needed   Vitamin D, Ergocalciferol, (DRISDOL) 1.25 MG (50000 UNIT) CAPS capsule Take 1 capsule (50,000 Units total) by mouth every 7 (seven) days.   diazepam (VALIUM) 2 MG tablet Take 1 tablet (2 mg total) by mouth every 8 (eight) hours as needed for anxiety. (Patient not taking: Reported on 08/21/2022)   EPINEPHrine 0.3 mg/0.3 mL IJ SOAJ injection Inject 0.3 mg into the muscle as needed for anaphylaxis. (Patient not taking: Reported on 08/21/2022)   gabapentin (NEURONTIN) 100 MG capsule Take 1 capsule (100 mg total) by mouth 3 (three) times daily as needed. (Patient not taking: Reported on 08/21/2022)   hydrOXYzine (ATARAX) 25 MG tablet Take 1 tablet (25 mg total) by mouth at bedtime and may repeat dose one time if needed. For sleep and anxiety (Patient not taking: Reported on 08/21/2022)   ibuprofen (ADVIL) 800 MG tablet Take 1 tablet (800 mg total) by mouth every 8 (eight) hours as needed. (Patient not taking: Reported on 08/21/2022)   influenza vac split quadrivalent PF (FLUARIX) 0.5 ML injection Inject into the  muscle. (Patient not taking: Reported on 06/13/2022)   meclizine (ANTIVERT) 25 MG tablet Take 1 tablet (25 mg total) by mouth 3 (three) times daily as needed for dizziness or nausea. (Patient not taking: Reported on 08/21/2022)   ondansetron (ZOFRAN-ODT) 4 MG disintegrating tablet Take 1 tablet (4 mg total) by mouth every 8 (eight) hours as needed for up to 20 doses for nausea or vomiting. (Patient not taking: Reported on 08/21/2022)   No current facility-administered medications on file prior to visit.   Surgical History:  Past Surgical History:  Procedure Laterality Date   APPENDECTOMY     BREAST REDUCTION SURGERY Bilateral 02/27/2022   Procedure: BREAST REDUCTION WITH LIPOSUCTION;  Surgeon: Peggye Form, DO;  Location: [facility];  Service: Plastics;  Laterality: Bilateral;  CERVICAL DISC SURGERY     DILITATION & CURRETTAGE/HYSTROSCOPY WITH THERMACHOICE ABLATION N/A 01/30/2014   Procedure: DILATATION & CURETTAGE/HYSTEROSCOPY WITH THERMACHOICE ABLATION;  Surgeon: Lazaro Arms, MD;  Location: [facility];  Service: Gynecology;  Laterality: N/A;  Total Ablation Therapy Time 9 minutes 5 seconds; 86-88 deg C   ENDOMETRIAL ABLATION     LAPAROSCOPIC BILATERAL SALPINGECTOMY Bilateral 01/30/2014   Procedure: LAPAROSCOPIC BILATERAL SALPINGECTOMY;  Surgeon: Lazaro Arms, MD;  Location: [facility];  Service: Gynecology;  Laterality: Bilateral;   Allergies:  Allergies  Allergen Reactions   Bee Venom Swelling   Codeine Nausea And Vomiting   Family History:  Family History  Problem Relation Age of Onset   Hypertension Mother    Mental illness Mother    Heart failure Father    Heart disease Father    Hypertension Father    Diabetes Brother    Hyperlipidemia Brother    Diabetes Paternal Grandmother    Heart disease Paternal Grandmother    Breast cancer Paternal Grandmother 58   Breast cancer Paternal Aunt 35       Objective:    BP 124/74   Pulse 78   Wt Readings from Last 3  Encounters:  06/13/22 227 lb (103 kg)  05/01/22 227 lb 3.2 oz (103.1 kg)  04/07/22 224 lb (101.6 kg)    Physical Exam Vitals and nursing note reviewed.  Constitutional:      General: She is not in acute distress.    Appearance: Normal appearance.  HENT:     Head: Normocephalic and atraumatic.     Right Ear: Hearing, tympanic membrane, ear canal and external ear normal.     Left Ear: Hearing, tympanic membrane, ear canal and external ear normal.     Nose: Nose normal.     Right Sinus: No maxillary sinus tenderness or frontal sinus tenderness.     Left Sinus: No maxillary sinus tenderness or frontal sinus tenderness.     Mouth/Throat:     Lips: Pink.     Mouth: Mucous membranes are moist.     Pharynx: Oropharynx is clear.  Eyes:     General: Lids are normal. Vision grossly intact.     Extraocular Movements: Extraocular movements intact.     Conjunctiva/sclera: Conjunctivae normal.     Pupils: Pupils are equal, round, and reactive to light.     Funduscopic exam:    Right eye: Red reflex present.        Left eye: Red reflex present.    Visual Fields: Right eye visual fields normal and left eye visual fields normal.  Neck:     Thyroid: No thyromegaly.     Vascular: No carotid bruit.  Cardiovascular:     Rate and Rhythm: Normal rate and regular rhythm.     Chest Wall: PMI is not displaced.     Pulses: Normal pulses.          Dorsalis pedis pulses are 2+ on the right side and 2+ on the left side.       Posterior tibial pulses are 2+ on the right side and 2+ on the left side.     Heart sounds: Normal heart sounds. No murmur heard. Pulmonary:     Effort: Pulmonary effort is normal. No respiratory distress.     Breath sounds: Normal breath sounds.  Abdominal:     General: Abdomen is flat. Bowel sounds are normal. There is no distension.     Palpations: Abdomen is soft. There  is no hepatomegaly, splenomegaly or mass.     Tenderness: There is no abdominal tenderness. There is no  right CVA tenderness, left CVA tenderness, guarding or rebound.  Musculoskeletal:        General: Normal range of motion.     Cervical back: Full passive range of motion without pain, normal range of motion and neck supple. No tenderness.     Right lower leg: No edema.     Left lower leg: No edema.  Feet:     Left foot:     Toenail Condition: Left toenails are normal.  Lymphadenopathy:     Cervical: No cervical adenopathy.     Upper Body:     Right upper body: No supraclavicular adenopathy.     Left upper body: No supraclavicular adenopathy.  Skin:    General: Skin is warm and dry.     Capillary Refill: Capillary refill takes less than 2 seconds.     Nails: There is no clubbing.  Neurological:     General: No focal deficit present.     Mental Status: She is alert and oriented to person, place, and time.     GCS: GCS eye subscore is 4. GCS verbal subscore is 5. GCS motor subscore is 6.     Sensory: Sensation is intact.     Motor: Motor function is intact.     Coordination: Coordination is intact.     Gait: Gait is intact.     Deep Tendon Reflexes: Reflexes are normal and symmetric.  Psychiatric:        Attention and Perception: Attention normal.        Mood and Affect: Mood normal.        Speech: Speech normal.        Behavior: Behavior normal. Behavior is cooperative.        Thought Content: Thought content normal.        Cognition and Memory: Cognition and memory normal.        Judgment: Judgment normal.     Results for orders placed or performed in visit on 08/21/22  CBC with Differential/Platelet  Result Value Ref Range   WBC 7.4 3.4 - 10.8 x10E3/uL   RBC 4.56 3.77 - 5.28 x10E6/uL   Hemoglobin 14.3 11.1 - 15.9 g/dL   Hematocrit 16.1 09.6 - 46.6 %   MCV 92 79 - 97 fL   MCH 31.4 26.6 - 33.0 pg   MCHC 34.0 31.5 - 35.7 g/dL   RDW 04.5 40.9 - 81.1 %   Platelets 273 150 - 450 x10E3/uL   Neutrophils 55 Not Estab. %   Lymphs 37 Not Estab. %   Monocytes 5 Not Estab. %    Eos 2 Not Estab. %   Basos 1 Not Estab. %   Neutrophils Absolute 4.1 1.4 - 7.0 x10E3/uL   Lymphocytes Absolute 2.7 0.7 - 3.1 x10E3/uL   Monocytes Absolute 0.4 0.1 - 0.9 x10E3/uL   EOS (ABSOLUTE) 0.1 0.0 - 0.4 x10E3/uL   Basophils Absolute 0.1 0.0 - 0.2 x10E3/uL   Immature Granulocytes 0 Not Estab. %   Immature Grans (Abs) 0.0 0.0 - 0.1 x10E3/uL  Comprehensive metabolic panel  Result Value Ref Range   Glucose 85 70 - 99 mg/dL   BUN 13 6 - 24 mg/dL   Creatinine, Ser 9.14 0.57 - 1.00 mg/dL   eGFR 782 >95 AO/ZHY/8.65   BUN/Creatinine Ratio 17 9 - 23   Sodium 138 134 - 144 mmol/L   Potassium  3.9 3.5 - 5.2 mmol/L   Chloride 102 96 - 106 mmol/L   CO2 22 20 - 29 mmol/L   Calcium 9.3 8.7 - 10.2 mg/dL   Total Protein 7.2 6.0 - 8.5 g/dL   Albumin 4.5 3.9 - 4.9 g/dL   Globulin, Total 2.7 1.5 - 4.5 g/dL   Albumin/Globulin Ratio 1.7 1.2 - 2.2   Bilirubin Total 1.4 (H) 0.0 - 1.2 mg/dL   Alkaline Phosphatase 51 44 - 121 IU/L   AST 16 0 - 40 IU/L   ALT 14 0 - 32 IU/L  Lipid panel  Result Value Ref Range   Cholesterol, Total 178 100 - 199 mg/dL   Triglycerides 92 0 - 149 mg/dL   HDL 83 >98 mg/dL   VLDL Cholesterol Cal 16 5 - 40 mg/dL   LDL Chol Calc (NIH) 79 0 - 99 mg/dL   Chol/HDL Ratio 2.1 0.0 - 4.4 ratio  Insulin, Free and Total  Result Value Ref Range   Free Insulin 7.2 uU/mL   Total Insulin 7.2 uU/mL  DHEA  Result Value Ref Range   Dehydroepiandrosterone (DHEA) 140 31 - 701 ng/dL  TSH  Result Value Ref Range   TSH 1.570 0.450 - 4.500 uIU/mL  Testosterone, Total, LC/MS/MS  Result Value Ref Range   Testosterone, total 17.1 ng/dL  FSH/LH  Result Value Ref Range   LH 5.4 mIU/mL   FSH 6.8 mIU/mL         Assessment & Plan:   Problem List Items Addressed This Visit     Weight gain    Kariyah has struggled with her weight for a while now. Initially she did have success on Wegovy, but that eventually stopped being effective for her. She has been working diligently on portion  control and healthy options as well as exercise. Her stress levels have been elevated, which could be contributing to the difficulty with management. It may be helpful to evaluate for possible insulin resistance to see if this is contributing.  Plan: - keep working on your diet and exercise - we will check a few labs today to see if there are any findings that could be contributing      Relevant Orders   Insulin, Free and Total (Completed)   DHEA (Completed)   TSH (Completed)   Testosterone, Total, LC/MS/MS (Completed)   FSH/LH (Completed)   Encounter for annual physical exam - Primary    CPE today with no abnormalities noted on exam.  Labs pending. Will make changes as necessary based on results.  Review of HM activities and recommendations discussed and provided on AVS Anticipatory guidance, diet, and exercise recommendations provided.  Medications, allergies, and hx reviewed and updated as necessary.  Plan to f/u with CPE in 1 year or sooner for acute/chronic health needs as directed.        Relevant Orders   CBC with Differential/Platelet (Completed)   Comprehensive metabolic panel (Completed)   Lipid panel (Completed)   Generalized anxiety disorder    Blossie is currently managed with lexapro and is doing well. This has been a very stressful year for her with work related issues but she is doing well.  Plan: - continue with your lexapro - let me know if your mood worsens or changes.       Missed menses    Menstrual irregularities noted with missed periods and hot flashes present. Symptoms appear to be consistent with perimenopause. She is young for this, but her symptoms are certainly not  unheard of. We can monitor her labs today to see if there are any abnormalities that we need to address.  Plan: - please let me know if you have any new or worsening symptoms.  - I will let you know if the labs show anything concerning.       Relevant Orders   Testosterone, Total,  LC/MS/MS (Completed)   FSH/LH (Completed)   Generalized edema    Aliya is experiencing generalized edema that is worse towards the end of the day. We will monitor labs today to ensure there are no complications. Examination is benign.  Plan: - labs pending - consider PRN lasix to help remove excess fluid.  - be sure you are staying well hydrated.       Other Visit Diagnoses     Health care maintenance       Relevant Orders   CBC with Differential/Platelet (Completed)   Comprehensive metabolic panel (Completed)   Lipid panel (Completed)          Follow up plan: Return in about 1 year (around 08/21/2023) for CPE.  NEXT PREVENTATIVE PHYSICAL DUE IN 1 YEAR.  PATIENT COUNSELING PROVIDED FOR ALL ADULT PATIENTS:  Consume a well balanced diet low in saturated fats, cholesterol, and moderation in carbohydrates.   This can be as simple as monitoring portion sizes and cutting back on sugary beverages such as soda and juice to start with.    Daily water consumption of at least 64 ounces.  Physical activity at least 180 minutes per week, if just starting out.   This can be as simple as taking the stairs instead of the elevator and walking 2-3 laps around the office  purposefully every day.   STD protection, partner selection, and regular testing if high risk.  Limited consumption of alcoholic beverages if alcohol is consumed.  For women, I recommend no more than 7 alcoholic beverages per week, spread out throughout the week.  Avoid "binge" drinking or consuming large quantities of alcohol in one setting.   Please let me know if you feel you may need help with reduction or quitting alcohol consumption.   Avoidance of nicotine, if used.  Please let me know if you feel you may need help with reduction or quitting nicotine use.   Daily mental health attention.  This can be in the form of 5 minute daily meditation, prayer, journaling, yoga, reflection, etc.   Purposeful attention to  your emotions and mental state can significantly improve your overall wellbeing and Health.  Please know that I am here to help you with all of your health care goals and am happy to work with you to find a solution that works best for you.  The greatest advice I have received with any changes in life are to take it one step at a time, that even means if all you can focus on is the next 60 seconds, then do that and celebrate your victories.  With any changes in life, you will have set backs, and that is OK. The important thing to remember is, if you have a set back, it is not a failure, it is an opportunity to try again!  Health Maintenance Recommendations Screening Testing Mammogram Every 1 -2 years based on history and risk factors Starting at age 42 Pap Smear Ages 21-39 every 3 years Ages 2630-65 every 5 years with HPV testing More frequent testing may be required based on results and history Colon Cancer Screening Every 1-10 years based  on test performed, risk factors, and history Starting at age 70 Bone Density Screening Every 2-10 years based on history Starting at age 33 for women Recommendations for men differ based on medication usage, history, and risk factors AAA Screening One time ultrasound Men 26-85 years old who have every smoked Lung Cancer Screening Low Dose Lung CT every 12 months Age 38-80 years with a 30 pack-year smoking history who still smoke or who have quit within the last 15 years  Screening Labs Routine  Labs: Complete Blood Count (CBC), Complete Metabolic Panel (CMP), Cholesterol (Lipid Panel) Every 6-12 months based on history and medications May be recommended more frequently based on current conditions or previous results Hemoglobin A1c Lab Every 3-12 months based on history and previous results Starting at age 84 or earlier with diagnosis of diabetes, high cholesterol, BMI >26, and/or risk factors Frequent monitoring for patients with diabetes to  ensure blood sugar control Thyroid Panel (TSH w/ T3 & T4) Every 6 months based on history, symptoms, and risk factors May be repeated more often if on medication HIV One time testing for all patients 80 and older May be repeated more frequently for patients with increased risk factors or exposure Hepatitis C One time testing for all patients 28 and older May be repeated more frequently for patients with increased risk factors or exposure Gonorrhea, Chlamydia Every 12 months for all sexually active persons 13-24 years Additional monitoring may be recommended for those who are considered high risk or who have symptoms PSA Men 13-57 years old with risk factors Additional screening may be recommended from age 49-69 based on risk factors, symptoms, and history  Vaccine Recommendations Tetanus Booster All adults every 10 years Flu Vaccine All patients 6 months and older every year COVID Vaccine All patients 12 years and older Initial dosing with booster May recommend additional booster based on age and health history HPV Vaccine 2 doses all patients age 48-26 Dosing may be considered for patients over 26 Shingles Vaccine (Shingrix) 2 doses all adults 55 years and older Pneumonia (Pneumovax 23) All adults 65 years and older May recommend earlier dosing based on health history Pneumonia (Prevnar 78) All adults 65 years and older Dosed 1 year after Pneumovax 23  Additional Screening, Testing, and Vaccinations may be recommended on an individualized basis based on family history, health history, risk factors, and/or exposure.

## 2022-08-21 NOTE — Patient Instructions (Addendum)
WEIGHT LOSS PLANNING For best management of weight, it is vital to balance intake versus output. This means the number of calories burned per day must be less than the calories you take in with food and drink.   I recommend trying to follow a diet with the following: Calories: 1200-1500 calories per day Carbohydrates: 150-180 grams of carbohydrates per day  Why: Gives your body enough "quick fuel" for cells to maintain normal function without sending them into starvation mode.  Protein: At least 90 grams of protein per day- 30 grams with each meal Why: Protein takes longer and uses more energy than carbohydrates to break down for fuel. The carbohydrates in your meals serves as quick energy sources and proteins help use some of that extra quick energy to break down to produce long term energy. This helps you not feel hungry as quickly and protein breakdown burns calories.  Water: Drink AT LEAST 64 ounces of water per day  Why: Water is essential to healthy metabolism. Water helps to fill the stomach and keep you fuller longer. Water is required for healthy digestion and filtering of waste in the body.  Fat: Limit fats in your diet- when choosing fats, choose foods with lower fats content such as lean meats (chicken, fish, turkey).  Why: Increased fat intake leads to storage "for later". Once you burn your carbohydrate energy, your body goes into fat and protein breakdown mode to help you loose weight.  Cholesterol: Fats and oils that are LIQUID at room temperature are best. Choose vegetable oils (olive oil, avocado oil, nuts). Avoid fats that are SOLID at room temperature (animal fats, processed meats). Healthy fats are often found in whole grains, beans, nuts, seeds, and berries.  Why: Elevated cholesterol levels lead to build up of cholesterol on the inside of your blood vessels. This will eventually cause the blood vessels to become hard and can lead to high blood pressure and damage to your organs.  When the blood flow is reduced, but the pressure is high from cholesterol buildup, parts of the cholesterol can break off and form clots that can go to the brain or heart leading to a stroke or heart attack.  Fiber: Increase amount of SOLUBLE the fiber in your diet. This helps to fill you up, lowers cholesterol, and helps with digestion. Some foods high in soluble fiber are oats, peas, beans, apples, carrots, barley, and citrus fruits.   Why: Fiber fills you up, helps remove excess cholesterol, and aids in healthy digestion which are all very important in weight management.   I recommend the following as a minimum activity routine: Purposeful walk or other physical activity at least 20 minutes every single day. This means purposefully taking a walk, jog, bike, swim, treadmill, elliptical, dance, etc.  This activity should be ABOVE your normal daily activities, such as walking at work. Goal exercise should be at least 150 minutes a week- work your way up to this.   Heart Rate: Your maximum exercise heart rate should be 220 - Your Age in Years. When exercising, get your heart rate up, but avoid going over the maximum targeted heart rate.  60-70% of your maximum heart rate is where you tend to burn the most fat. To find this number:  220 - Age In Years= Max HR  Max HR x 0.6 (or 0.7) = Fat Burning HR The Fat Burning HR is your goal heart rate while working out to burn the most fat.  NEVER exercise to the   point your feel lightheaded, weak, nauseated, dizzy. If you experience ANY of these symptoms- STOP exercise! Allow yourself to cool down and your heart rate to come down. Then restart slower next time.  If at ANY TIME you feel chest pain or chest pressure during exercise, STOP IMMEDIATELY and seek medical attention.     For all adult patients, I recommend A well balanced diet low in saturated fats, cholesterol, and moderation in carbohydrates.   This can be as simple as monitoring portion sizes  and cutting back on sugary beverages such as soda and juice to start with.    Daily water consumption of at least 64 ounces.  Physical activity at least 180 minutes per week, if just starting out.   This can be as simple as taking the stairs instead of the elevator and walking 2-3 laps around the office  purposefully every day.   STD protection, partner selection, and regular testing if high risk.  Limited consumption of alcoholic beverages if alcohol is consumed.  For women, I recommend no more than 7 alcoholic beverages per week, spread out throughout the week.  Avoid "binge" drinking or consuming large quantities of alcohol in one setting.   Please let me know if you feel you may need help with reduction or quitting alcohol consumption.   Avoidance of nicotine, if used.  Please let me know if you feel you may need help with reduction or quitting nicotine use.   Daily mental health attention.  This can be in the form of 5 minute daily meditation, prayer, journaling, yoga, reflection, etc.   Purposeful attention to your emotions and mental state can significantly improve your overall wellbeing  and  Health.  Please know that I am here to help you with all of your health care goals and am happy to work with you to find a solution that works best for you.  The greatest advice I have received with any changes in life are to take it one step at a time, that even means if all you can focus on is the next 60 seconds, then do that and celebrate your victories.  With any changes in life, you will have set backs, and that is OK. The important thing to remember is, if you have a set back, it is not a failure, it is an opportunity to try again!  Health Maintenance Recommendations Screening Testing Mammogram Every 1 -2 years based on history and risk factors Starting at age 40 Pap Smear Ages 21-39 every 3 years Ages 30-65 every 5 years with HPV testing More frequent testing may be required  based on results and history Colon Cancer Screening Every 1-10 years based on test performed, risk factors, and history Starting at age 45 Bone Density Screening Every 2-10 years based on history Starting at age 65 for women Recommendations for men differ based on medication usage, history, and risk factors AAA Screening One time ultrasound Men 65-75 years old who have every smoked Lung Cancer Screening Low Dose Lung CT every 12 months Age 55-80 years with a 30 pack-year smoking history who still smoke or who have quit within the last 15 years  Screening Labs Routine  Labs: Complete Blood Count (CBC), Complete Metabolic Panel (CMP), Cholesterol (Lipid Panel) Every 6-12 months based on history and medications May be recommended more frequently based on current conditions or previous results Hemoglobin A1c Lab Every 3-12 months based on history and previous results Starting at age 45 or earlier with diagnosis   of diabetes, high cholesterol, BMI >26, and/or risk factors Frequent monitoring for patients with diabetes to ensure blood sugar control Thyroid Panel (TSH w/ T3 & T4) Every 6 months based on history, symptoms, and risk factors May be repeated more often if on medication HIV One time testing for all patients 13 and older May be repeated more frequently for patients with increased risk factors or exposure Hepatitis C One time testing for all patients 18 and older May be repeated more frequently for patients with increased risk factors or exposure Gonorrhea, Chlamydia Every 12 months for all sexually active persons 13-24 years Additional monitoring may be recommended for those who are considered high risk or who have symptoms PSA Men 40-54 years old with risk factors Additional screening may be recommended from age 55-69 based on risk factors, symptoms, and history  Vaccine Recommendations Tetanus Booster All adults every 10 years Flu Vaccine All patients 6 months and  older every year COVID Vaccine All patients 12 years and older Initial dosing with booster May recommend additional booster based on age and health history HPV Vaccine 2 doses all patients age 9-26 Dosing may be considered for patients over 26 Shingles Vaccine (Shingrix) 2 doses all adults 55 years and older Pneumonia (Pneumovax 23) All adults 65 years and older May recommend earlier dosing based on health history Pneumonia (Prevnar 13) All adults 65 years and older Dosed 1 year after Pneumovax 23  Additional Screening, Testing, and Vaccinations may be recommended on an individualized basis based on family history, health history, risk factors, and/or exposure.   

## 2022-08-22 LAB — TSH: TSH: 1.57 u[IU]/mL (ref 0.450–4.500)

## 2022-08-24 LAB — FSH/LH
FSH: 6.8 m[IU]/mL
LH: 5.4 m[IU]/mL

## 2022-08-24 LAB — TESTOSTERONE, TOTAL, LC/MS/MS: Testosterone, total: 17.1 ng/dL

## 2022-08-27 ENCOUNTER — Encounter: Payer: Self-pay | Admitting: Nurse Practitioner

## 2022-08-27 DIAGNOSIS — N926 Irregular menstruation, unspecified: Secondary | ICD-10-CM | POA: Insufficient documentation

## 2022-08-27 DIAGNOSIS — R601 Generalized edema: Secondary | ICD-10-CM | POA: Insufficient documentation

## 2022-08-27 LAB — CBC WITH DIFFERENTIAL/PLATELET
Basophils Absolute: 0.1 10*3/uL (ref 0.0–0.2)
Basos: 1 %
EOS (ABSOLUTE): 0.1 10*3/uL (ref 0.0–0.4)
Eos: 2 %
Hematocrit: 42.1 % (ref 34.0–46.6)
Hemoglobin: 14.3 g/dL (ref 11.1–15.9)
Immature Grans (Abs): 0 10*3/uL (ref 0.0–0.1)
Immature Granulocytes: 0 %
Lymphocytes Absolute: 2.7 10*3/uL (ref 0.7–3.1)
Lymphs: 37 %
MCH: 31.4 pg (ref 26.6–33.0)
MCHC: 34 g/dL (ref 31.5–35.7)
MCV: 92 fL (ref 79–97)
Monocytes Absolute: 0.4 10*3/uL (ref 0.1–0.9)
Monocytes: 5 %
Neutrophils Absolute: 4.1 10*3/uL (ref 1.4–7.0)
Neutrophils: 55 %
Platelets: 273 10*3/uL (ref 150–450)
RBC: 4.56 x10E6/uL (ref 3.77–5.28)
RDW: 12.8 % (ref 11.7–15.4)
WBC: 7.4 10*3/uL (ref 3.4–10.8)

## 2022-08-27 LAB — COMPREHENSIVE METABOLIC PANEL
ALT: 14 IU/L (ref 0–32)
AST: 16 IU/L (ref 0–40)
Albumin/Globulin Ratio: 1.7 (ref 1.2–2.2)
Albumin: 4.5 g/dL (ref 3.9–4.9)
Alkaline Phosphatase: 51 IU/L (ref 44–121)
BUN/Creatinine Ratio: 17 (ref 9–23)
BUN: 13 mg/dL (ref 6–24)
Bilirubin Total: 1.4 mg/dL — ABNORMAL HIGH (ref 0.0–1.2)
CO2: 22 mmol/L (ref 20–29)
Calcium: 9.3 mg/dL (ref 8.7–10.2)
Chloride: 102 mmol/L (ref 96–106)
Creatinine, Ser: 0.75 mg/dL (ref 0.57–1.00)
Globulin, Total: 2.7 g/dL (ref 1.5–4.5)
Glucose: 85 mg/dL (ref 70–99)
Potassium: 3.9 mmol/L (ref 3.5–5.2)
Sodium: 138 mmol/L (ref 134–144)
Total Protein: 7.2 g/dL (ref 6.0–8.5)
eGFR: 102 mL/min/{1.73_m2} (ref >59)

## 2022-08-27 LAB — LIPID PANEL
Chol/HDL Ratio: 2.1 ratio (ref 0.0–4.4)
Cholesterol, Total: 178 mg/dL (ref 100–199)
HDL: 83 mg/dL (ref >39)
LDL Chol Calc (NIH): 79 mg/dL (ref 0–99)
Triglycerides: 92 mg/dL (ref 0–149)
VLDL Cholesterol Cal: 16 mg/dL (ref 5–40)

## 2022-08-27 LAB — DHEA: Dehydroepiandrosterone (DHEA): 140 ng/dL (ref 31–701)

## 2022-08-27 LAB — INSULIN, FREE AND TOTAL
Free Insulin: 7.2 uU/mL
Total Insulin: 7.2 uU/mL

## 2022-08-27 NOTE — Assessment & Plan Note (Signed)
Elizabeth Franklin has struggled with her weight for a while now. Initially she did have success on Wegovy, but that eventually stopped being effective for her. She has been working diligently on portion control and healthy options as well as exercise. Her stress levels have been elevated, which could be contributing to the difficulty with management. It may be helpful to evaluate for possible insulin resistance to see if this is contributing.  Plan: - keep working on your diet and exercise - we will check a few labs today to see if there are any findings that could be contributing

## 2022-08-27 NOTE — Assessment & Plan Note (Signed)
Elizabeth Franklin is experiencing generalized edema that is worse towards the end of the day. We will monitor labs today to ensure there are no complications. Examination is benign.  Plan: - labs pending - consider PRN lasix to help remove excess fluid.  - be sure you are staying well hydrated.

## 2022-08-27 NOTE — Assessment & Plan Note (Signed)
Elizabeth Franklin is currently managed with lexapro and is doing well. This has been a very stressful year for her with work related issues but she is doing well.  Plan: - continue with your lexapro - let me know if your mood worsens or changes.

## 2022-08-27 NOTE — Assessment & Plan Note (Signed)

## 2022-08-27 NOTE — Assessment & Plan Note (Signed)
Menstrual irregularities noted with missed periods and hot flashes present. Symptoms appear to be consistent with perimenopause. She is young for this, but her symptoms are certainly not unheard of. We can monitor her labs today to see if there are any abnormalities that we need to address.  Plan: - please let me know if you have any new or worsening symptoms.  - I will let you know if the labs show anything concerning.

## 2022-09-08 ENCOUNTER — Other Ambulatory Visit: Payer: Self-pay | Admitting: Nurse Practitioner

## 2022-09-08 ENCOUNTER — Other Ambulatory Visit (HOSPITAL_BASED_OUTPATIENT_CLINIC_OR_DEPARTMENT_OTHER): Payer: Self-pay

## 2022-09-08 DIAGNOSIS — Z6832 Body mass index (BMI) 32.0-32.9, adult: Secondary | ICD-10-CM

## 2022-09-08 MED ORDER — PHENTERMINE HCL 37.5 MG PO TABS
37.5000 mg | ORAL_TABLET | Freq: Every day | ORAL | 0 refills | Status: DC
Start: 2022-09-08 — End: 2022-12-29
  Filled 2022-09-08: qty 90, 90d supply, fill #0

## 2022-10-27 ENCOUNTER — Ambulatory Visit
Admission: RE | Admit: 2022-10-27 | Discharge: 2022-10-27 | Disposition: A | Discharge status: Home / Self Care | Payer: 59 | Source: Ambulatory Visit | Attending: Urgent Care | Admitting: Urgent Care

## 2022-10-27 ENCOUNTER — Ambulatory Visit (INDEPENDENT_AMBULATORY_CARE_PROVIDER_SITE_OTHER): Payer: 59

## 2022-10-27 VITALS — BP 146/98 | HR 68 | Temp 98.7°F | Resp 18

## 2022-10-27 DIAGNOSIS — Z23 Encounter for immunization: Secondary | ICD-10-CM | POA: Diagnosis not present

## 2022-10-27 DIAGNOSIS — S60221A Contusion of right hand, initial encounter: Secondary | ICD-10-CM | POA: Diagnosis not present

## 2022-10-27 DIAGNOSIS — S60413A Abrasion of left middle finger, initial encounter: Secondary | ICD-10-CM

## 2022-10-27 DIAGNOSIS — M79645 Pain in left finger(s): Secondary | ICD-10-CM | POA: Diagnosis not present

## 2022-10-27 DIAGNOSIS — S41132A Puncture wound without foreign body of left upper arm, initial encounter: Secondary | ICD-10-CM | POA: Diagnosis not present

## 2022-10-27 DIAGNOSIS — S60943A Unspecified superficial injury of left middle finger, initial encounter: Secondary | ICD-10-CM | POA: Diagnosis not present

## 2022-10-27 MED ORDER — TETANUS-DIPHTH-ACELL PERTUSSIS 5-2.5-18.5 LF-MCG/0.5 IM SUSY
0.5000 mL | PREFILLED_SYRINGE | Freq: Once | INTRAMUSCULAR | Status: AC
  Administered 2022-10-27: 0.5 mL via INTRAMUSCULAR

## 2022-10-27 NOTE — ED Triage Notes (Signed)
Pt presents with c/o puncture to the left upper extremity of arm and rt middle finger injury. Pt states she is unsure of when she had her last tetanus shot.

## 2022-10-27 NOTE — ED Provider Notes (Signed)
[facility]  Note:  This document was prepared using Conservation officer, historic buildings and may include unintentional dictation errors.  MRN: [id number] DOB: [birth year]  Subjective:   Elizabeth Franklin is a 42 y.o. female presenting for multiple injuries over the weekend. She suffered a puncture wound from an old rusty nail to the left lateral upper extremity, crush injury from a beam to the right dorsum of her hand, left middle finger. Had slight break to the skin just past the nail cuticle for the left middle finger. No nail injury. Has some ecchymosis of the dorsum of the right hand and slight pain to the right hand.   No current facility-administered medications for this encounter.  Current Outpatient Medications:    betamethasone dipropionate (DIPROLENE) 0.05 % ointment, Apply topically 2 (two) times daily. To eczema rash., Disp: 45 g, Rfl: 3   cyclobenzaprine (FLEXERIL) 10 MG tablet, Take 1 tablet (10 mg total) by mouth 3 (three) times daily as needed for muscle spasms., Disp: 60 tablet, Rfl: 6   diazepam (VALIUM) 2 MG tablet, Take 1 tablet (2 mg total) by mouth every 8 (eight) hours as needed for anxiety. (Patient not taking: Reported on 08/21/2022), Disp: 30 tablet, Rfl: 0   EPINEPHrine 0.3 mg/0.3 mL IJ SOAJ injection, Inject 0.3 mg into the muscle as needed for anaphylaxis. (Patient not taking: Reported on 08/21/2022), Disp: 0.6 mL, Rfl: 1   escitalopram (LEXAPRO) 10 MG tablet, Take 1 tablet (10 mg total) by mouth daily., Disp: 90 tablet, Rfl: 3   furosemide (LASIX) 20 MG tablet, Take 1 tablet (20 mg total) by mouth daily., Disp: 30 tablet, Rfl: 3   gabapentin (NEURONTIN) 100 MG capsule, Take 1 capsule (100 mg total) by mouth 3 (three) times daily as needed. (Patient not taking: Reported on 08/21/2022), Disp: 270 capsule, Rfl: 3   hydrOXYzine (ATARAX) 25 MG tablet, Take 1 tablet (25 mg total) by mouth at bedtime and may repeat dose one time if needed. For sleep and  anxiety (Patient not taking: Reported on 08/21/2022), Disp: 60 tablet, Rfl: 11   ibuprofen (ADVIL) 800 MG tablet, Take 1 tablet (800 mg total) by mouth every 8 (eight) hours as needed. (Patient not taking: Reported on 08/21/2022), Disp: 90 tablet, Rfl: 6   influenza vac split quadrivalent PF (FLUARIX) 0.5 ML injection, Inject into the muscle. (Patient not taking: Reported on 06/13/2022), Disp: 0.5 mL, Rfl: 0   meclizine (ANTIVERT) 25 MG tablet, Take 1 tablet (25 mg total) by mouth 3 (three) times daily as needed for dizziness or nausea. (Patient not taking: Reported on 08/21/2022), Disp: 90 tablet, Rfl: 3   ondansetron (ZOFRAN-ODT) 4 MG disintegrating tablet, Take 1 tablet (4 mg total) by mouth every 8 (eight) hours as needed for up to 20 doses for nausea or vomiting. (Patient not taking: Reported on 08/21/2022), Disp: 20 tablet, Rfl: 0   phentermine (ADIPEX-P) 37.5 MG tablet, Take 1 tablet (37.5 mg total) by mouth daily before breakfast., Disp: 90 tablet, Rfl: 0   rizatriptan (MAXALT) 10 MG tablet, Take 1 tablet (10 mg total) by mouth as needed for migraine. May repeat in 2 hours if needed, Disp: 18 tablet, Rfl: 4   Vitamin D, Ergocalciferol, (DRISDOL) 1.25 MG (50000 UNIT) CAPS capsule, Take 1 capsule (50,000 Units total) by mouth every 7 (seven) days., Disp: 12 capsule, Rfl: 1   Allergies  Allergen Reactions   Bee Venom Swelling   Codeine Nausea And Vomiting    Past Medical History:  Diagnosis Date   Acute maxillary sinusitis 08/02/2020   Anxiety    Bilateral posterior neck pain 07/19/2020   Body mass index (BMI) 34.0-34.9, adult 07/19/2020   Cephalalgia 08/01/2020   Chronic headaches    Dermatitis 08/30/2020   Excessive or frequent menstruation 11/21/2013   Neck pain with history of cervical spinal surgery 08/01/2020   PONV (postoperative nausea and vomiting)    Screening mammogram for breast cancer 07/19/2020   Slow transit constipation 07/19/2020   Weight gain 07/19/2020     Past  Surgical History:  Procedure Laterality Date   APPENDECTOMY     BREAST REDUCTION SURGERY Bilateral 02/27/2022   Procedure: BREAST REDUCTION WITH LIPOSUCTION;  Surgeon: Peggye Form, DO;  Location: [facility];  Service: Plastics;  Laterality: Bilateral;   CERVICAL DISC SURGERY     DILITATION & CURRETTAGE/HYSTROSCOPY WITH THERMACHOICE ABLATION N/A 01/30/2014   Procedure: DILATATION & CURETTAGE/HYSTEROSCOPY WITH THERMACHOICE ABLATION;  Surgeon: Lazaro Arms, MD;  Location: [facility];  Service: Gynecology;  Laterality: N/A;  Total Ablation Therapy Time 9 minutes 5 seconds; 86-88 deg C   ENDOMETRIAL ABLATION     LAPAROSCOPIC BILATERAL SALPINGECTOMY Bilateral 01/30/2014   Procedure: LAPAROSCOPIC BILATERAL SALPINGECTOMY;  Surgeon: Lazaro Arms, MD;  Location: [facility];  Service: Gynecology;  Laterality: Bilateral;    Family History  Problem Relation Age of Onset   Hypertension Mother    Mental illness Mother    Heart failure Father    Heart disease Father    Hypertension Father    Diabetes Brother    Hyperlipidemia Brother    Diabetes Paternal Grandmother    Heart disease Paternal Grandmother    Breast cancer Paternal Grandmother 74   Breast cancer Paternal Aunt 50    Social History   Tobacco Use   Smoking status: Former    Packs/day: 0.25    Years: 1.00    Additional pack years: 0.00    Total pack years: 0.25    Types: Cigarettes    Quit date: 01/26/1999    Years since quitting: 23.7   Smokeless tobacco: Never  Vaping Use   Vaping Use: Never used  Substance Use Topics   Alcohol use: Yes    Comment: occassional   Drug use: No    ROS   Objective:   Vitals: BP (!) 146/98 (BP Location: Right Arm)   Pulse 68   Temp 98.7 F (37.1 C) (Oral)   Resp 18   SpO2 96%   Physical Exam Constitutional:      General: She is not in acute distress.    Appearance: Normal appearance. She is well-developed. She is not ill-appearing, toxic-appearing or diaphoretic.   HENT:     Head: Normocephalic and atraumatic.     Nose: Nose normal.     Mouth/Throat:     Mouth: Mucous membranes are moist.  Eyes:     General: No scleral icterus.       Right eye: No discharge.        Left eye: No discharge.     Extraocular Movements: Extraocular movements intact.  Cardiovascular:     Rate and Rhythm: Normal rate.  Pulmonary:     Effort: Pulmonary effort is normal.  Musculoskeletal:       Arms:       Hands:  Skin:    General: Skin is warm and dry.  Neurological:     General: No focal deficit present.     Mental Status: She is alert  and oriented to person, place, and time.  Psychiatric:        Mood and Affect: Mood normal.        Behavior: Behavior normal.    Tdap updated in clinic.    Assessment and Plan :   PDMP not reviewed this encounter.  1. Puncture wound of left upper extremity   2. Abrasion of left middle finger, initial encounter   3. Contusion of dorsum of right hand   4. Need for Tdap vaccination     X-ray over-read was pending at time of discharge, recommended follow up with only abnormal results. Otherwise will not call for negative over-read. Patient was in agreement. Recommended conservative management, general wound care for her multiple injuries. Will defer antibiotic prophylaxis. Counseled patient on potential for adverse effects with medications prescribed/recommended today, ER and return-to-clinic precautions discussed, patient verbalized understanding.    Wallis Bamberg, PA-C 10/27/22 1650

## 2022-12-29 ENCOUNTER — Ambulatory Visit (INDEPENDENT_AMBULATORY_CARE_PROVIDER_SITE_OTHER): Payer: 59 | Admitting: Nurse Practitioner

## 2022-12-29 ENCOUNTER — Encounter: Payer: Self-pay | Admitting: Nurse Practitioner

## 2022-12-29 VITALS — BP 130/84 | HR 91 | Wt 226.4 lb

## 2022-12-29 DIAGNOSIS — E559 Vitamin D deficiency, unspecified: Secondary | ICD-10-CM | POA: Diagnosis not present

## 2022-12-29 DIAGNOSIS — R5383 Other fatigue: Secondary | ICD-10-CM | POA: Diagnosis not present

## 2022-12-29 DIAGNOSIS — M25441 Effusion, right hand: Secondary | ICD-10-CM | POA: Diagnosis not present

## 2022-12-29 DIAGNOSIS — E538 Deficiency of other specified B group vitamins: Secondary | ICD-10-CM

## 2022-12-29 NOTE — Patient Instructions (Signed)
I think right now you are overwhelmed with all of the things going on in life right now. I want you to find a way to focus on you for at least one day a week for a few hours.

## 2022-12-29 NOTE — Progress Notes (Signed)
Elizabeth Clamp, DNP, AGNP-c [facility]  [address] [place], [place] [address] [phone]  ESTABLISHED PATIENT- Chronic Health and/or Follow-Up Visit  Blood pressure 130/84, pulse 91, weight 226 lb 6.4 oz (102.7 kg).    Elizabeth Franklin is a 42 y.o. year old female presenting today for evaluation and management of chronic conditions.   Mood [name] reports that she recently stopped her Lexapro as she felt that the circumstances in her life which triggered her to start the medication have resolved.  She tells me that since stopping the medicine she has felt good and does not feel that she needs to return on the medication.  She does report significant fatigue however.  She tells me that after working all day she comes home and is tired and often ready to go to bed Elizabeth Franklin.  She does get up Elizabeth Franklin as well.  She tells me that her husband is concerned about her fatigue as he tends to get up around 4 AM and exercises and is able to continue on until late into the night.  She reports that he is concerned that there could be something wrong which is why she has presented today.  She does report that there are numerous processes going on in her life right now with various projects around the house.  She does admit to feeling a little overwhelmed by the continuous starting of new projects without a break.  All ROS negative with exception of what is listed above.   PHYSICAL EXAM Physical Exam Vitals and nursing note reviewed.  Constitutional:      Appearance: Normal appearance.  HENT:     Head: Normocephalic.  Eyes:     Conjunctiva/sclera: Conjunctivae normal.     Pupils: Pupils are equal, round, and reactive to light.  Neck:     Vascular: No carotid bruit.  Cardiovascular:     Rate and Rhythm: Normal rate and regular rhythm.     Pulses: Normal pulses.     Heart sounds: Normal heart sounds.  Pulmonary:     Effort: Pulmonary effort is normal.     Breath sounds: Normal  breath sounds.  Abdominal:     General: Bowel sounds are normal. There is no distension.     Palpations: Abdomen is soft.     Tenderness: There is no abdominal tenderness.  Musculoskeletal:        General: Normal range of motion.     Cervical back: Normal range of motion. No tenderness.     Right lower leg: No edema.     Left lower leg: No edema.  Skin:    General: Skin is warm.     Capillary Refill: Capillary refill takes less than 2 seconds.  Neurological:     General: No focal deficit present.     Mental Status: She is alert and oriented to person, place, and time.     Motor: No weakness.  Psychiatric:        Mood and Affect: Mood normal.        Behavior: Behavior normal.     PLAN Problem List Items Addressed This Visit     Fatigue - Primary    Ongoing fatigue of unknown etiology.  At this time it does not appear that her mood is affecting this.  We have monitored her labs recently and there was no significant abnormality noted in her hemoglobin or thyroid which could contribute.  She has had a history of vitamin D deficiency which could be contributory  but unlikely causing this level of fatigue.  She does mention some swelling in her finger with a little paresthesias which could indicate possible B12 deficiency.  We will monitor labs today to determine if any of these are causative factors and begin replacement as necessary.  Will plan to follow-up after the labs if symptoms persist or no findings are present.      Finger joint swelling, right   Relevant Orders   Uric Acid (Completed)   Other Visit Diagnoses     Low serum vitamin B12       Relevant Orders   Vitamin B12 (Completed)   Vitamin D deficiency       Relevant Orders   VITAMIN D 25 Hydroxy (Vit-D Deficiency, Fractures) (Completed)       Return if symptoms worsen or fail to improve.    Elizabeth Clamp, DNP, AGNP-c

## 2022-12-30 LAB — VITAMIN D 25 HYDROXY (VIT D DEFICIENCY, FRACTURES): Vit D, 25-Hydroxy: 26.8 ng/mL — ABNORMAL LOW (ref 30.0–100.0)

## 2022-12-30 LAB — URIC ACID: Uric Acid: 3.4 mg/dL (ref 2.6–6.2)

## 2022-12-30 LAB — VITAMIN B12: Vitamin B-12: 284 pg/mL (ref 232–1245)

## 2023-01-07 ENCOUNTER — Encounter: Payer: Self-pay | Admitting: Nurse Practitioner

## 2023-01-07 ENCOUNTER — Other Ambulatory Visit: Payer: Self-pay | Admitting: Nurse Practitioner

## 2023-01-07 ENCOUNTER — Other Ambulatory Visit (HOSPITAL_BASED_OUTPATIENT_CLINIC_OR_DEPARTMENT_OTHER): Payer: Self-pay

## 2023-01-07 DIAGNOSIS — E538 Deficiency of other specified B group vitamins: Secondary | ICD-10-CM

## 2023-01-07 DIAGNOSIS — R5383 Other fatigue: Secondary | ICD-10-CM | POA: Insufficient documentation

## 2023-01-07 DIAGNOSIS — E559 Vitamin D deficiency, unspecified: Secondary | ICD-10-CM

## 2023-01-07 DIAGNOSIS — M25441 Effusion, right hand: Secondary | ICD-10-CM | POA: Insufficient documentation

## 2023-01-07 MED ORDER — VITAMIN D (ERGOCALCIFEROL) 1.25 MG (50000 UNIT) PO CAPS
ORAL_CAPSULE | ORAL | 3 refills | Status: AC
  Filled 2023-01-07: qty 24, 84d supply, fill #0
  Filled 2023-04-07: qty 24, 84d supply, fill #1

## 2023-01-07 MED ORDER — CYANOCOBALAMIN 1000 MCG/ML IJ SOLN
1000.0000 ug | INTRAMUSCULAR | 10 refills | Status: AC
Start: 2023-01-07 — End: ?
  Filled 2023-01-07: qty 1, 30d supply, fill #0
  Filled 2023-02-09: qty 1, 30d supply, fill #1
  Filled 2023-04-07: qty 1, 30d supply, fill #2

## 2023-01-07 MED ORDER — BD ECLIPSE SYRINGE/NEEDLE 23G X 1" 3 ML MISC
99 refills | Status: AC
Start: 2023-01-07 — End: ?
  Filled 2023-01-07: qty 2, 28d supply, fill #0
  Filled 2023-02-09: qty 2, 30d supply, fill #0
  Filled 2023-04-07: qty 2, 30d supply, fill #1

## 2023-01-07 NOTE — Assessment & Plan Note (Signed)
Ongoing fatigue of unknown etiology.  At this time it does not appear that her mood is affecting this.  We have monitored her labs recently and there was no significant abnormality noted in her hemoglobin or thyroid which could contribute.  She has had a history of vitamin D deficiency which could be contributory but unlikely causing this level of fatigue.  She does mention some swelling in her finger with a little paresthesias which could indicate possible B12 deficiency.  We will monitor labs today to determine if any of these are causative factors and begin replacement as necessary.  Will plan to follow-up after the labs if symptoms persist or no findings are present.

## 2023-02-10 ENCOUNTER — Other Ambulatory Visit (HOSPITAL_BASED_OUTPATIENT_CLINIC_OR_DEPARTMENT_OTHER): Payer: Self-pay

## 2023-02-10 ENCOUNTER — Other Ambulatory Visit: Payer: Self-pay

## 2023-02-18 ENCOUNTER — Ambulatory Visit (HOSPITAL_BASED_OUTPATIENT_CLINIC_OR_DEPARTMENT_OTHER): Payer: 59

## 2023-02-18 ENCOUNTER — Other Ambulatory Visit (HOSPITAL_BASED_OUTPATIENT_CLINIC_OR_DEPARTMENT_OTHER): Payer: Self-pay | Admitting: Student

## 2023-02-18 DIAGNOSIS — M25561 Pain in right knee: Secondary | ICD-10-CM

## 2023-02-18 DIAGNOSIS — M79671 Pain in right foot: Secondary | ICD-10-CM | POA: Diagnosis not present

## 2023-02-19 ENCOUNTER — Encounter (HOSPITAL_BASED_OUTPATIENT_CLINIC_OR_DEPARTMENT_OTHER): Payer: Self-pay | Admitting: Student

## 2023-02-19 ENCOUNTER — Ambulatory Visit (INDEPENDENT_AMBULATORY_CARE_PROVIDER_SITE_OTHER): Payer: 59 | Admitting: Student

## 2023-02-19 DIAGNOSIS — M722 Plantar fascial fibromatosis: Secondary | ICD-10-CM

## 2023-02-19 DIAGNOSIS — M25561 Pain in right knee: Secondary | ICD-10-CM

## 2023-02-19 NOTE — Progress Notes (Signed)
Chief Complaint: Right knee and foot pain     History of Present Illness:    Elizabeth Franklin is a 42 y.o. female presenting today with pain in her right knee and right foot.  Patient reports that she had a fall on 11/17/2022 when she slipped on water on the bathroom floor.  During the fall, her right leg went underneath her body.  Her knee is the main concern today which she states continues to be moderately painful and does not feel stable.  She has had instances of buckling.  Pain is located mainly over the medial and lateral aspect.  This is worse with going up and down steps.  Her foot is painful on the medial and plantar aspects and is worse with weightbearing.  States that this is notably worse when she stands up in the morning.  For treatment she has tried ibuprofen, Flexeril, Voltaren gel, Biofreeze, icing and heating, as well as compression socks.  She is not getting any significant relief to this point.  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   Surgical History:   None  PMH/PSH/Family History/Social History/Meds/Allergies:    Past Medical History:  Diagnosis Date   Acute maxillary sinusitis 08/02/2020   Anxiety    Bilateral posterior neck pain 07/19/2020   Body mass index (BMI) 34.0-34.9, adult 07/19/2020   Cephalalgia 08/01/2020   Chronic headaches    Dermatitis 08/30/2020   Excessive or frequent menstruation 11/21/2013   Neck pain with history of cervical spinal surgery 08/01/2020   PONV (postoperative nausea and vomiting)    Screening mammogram for breast cancer 07/19/2020   Slow transit constipation 07/19/2020   Weight gain 07/19/2020   Past Surgical History:  Procedure Laterality Date   APPENDECTOMY     BREAST REDUCTION SURGERY Bilateral 02/27/2022   Procedure: BREAST REDUCTION WITH LIPOSUCTION;  Surgeon: Peggye Form, DO;  Location: [facility];  Service: Plastics;   Laterality: Bilateral;   CERVICAL DISC SURGERY     DILITATION & CURRETTAGE/HYSTROSCOPY WITH THERMACHOICE ABLATION N/A 01/30/2014   Procedure: DILATATION & CURETTAGE/HYSTEROSCOPY WITH THERMACHOICE ABLATION;  Surgeon: Lazaro Arms, MD;  Location: [facility];  Service: Gynecology;  Laterality: N/A;  Total Ablation Therapy Time 9 minutes 5 seconds; 86-88 deg C   ENDOMETRIAL ABLATION     LAPAROSCOPIC BILATERAL SALPINGECTOMY Bilateral 01/30/2014   Procedure: LAPAROSCOPIC BILATERAL SALPINGECTOMY;  Surgeon: Lazaro Arms, MD;  Location: [facility];  Service: Gynecology;  Laterality: Bilateral;   Social History   Socioeconomic History   Marital status: Married    Spouse name: Susette Racer   Number of children: 1   Years of education: Not on file   Highest education level: Not on file  Occupational History   Not on file  Tobacco Use   Smoking status: Former    Current packs/day: 0.00    Average packs/day: 0.3 packs/day for 1 year (0.3 ttl pk-yrs)    Types: Cigarettes    Start date: 01/25/1998    Quit date: 01/26/1999    Years since quitting: 24.0   Smokeless tobacco: Never  Vaping Use   Vaping status: Never Used  Substance and Sexual Activity   Alcohol use: Yes    Comment: occassional   Drug use: No  Sexual activity: Yes    Partners: Male    Birth control/protection: Surgical    Comment: monogomous relationship  Other Topics Concern   Not on file  Social History Narrative   Not on file   Social Determinants of Health   Financial Resource Strain: Not on file  Food Insecurity: Not on file  Transportation Needs: Not on file  Physical Activity: Inactive (07/19/2020)   Exercise Vital Sign    Days of Exercise per Week: 0 days    Minutes of Exercise per Session: 0 min  Stress: Not on file  Social Connections: Not on file   Family History  Problem Relation Age of Onset   Hypertension Mother    Mental illness Mother    Heart failure Father    Heart disease Father    Hypertension Father     Diabetes Brother    Hyperlipidemia Brother    Diabetes Paternal Grandmother    Heart disease Paternal Grandmother    Breast cancer Paternal Grandmother 60   Breast cancer Paternal Aunt 41   Allergies  Allergen Reactions   Bee Venom Swelling   Codeine Nausea And Vomiting   Current Outpatient Medications  Medication Sig Dispense Refill   betamethasone dipropionate (DIPROLENE) 0.05 % ointment Apply topically 2 (two) times daily. To eczema rash. 45 g 3   cyanocobalamin (VITAMIN B12) 1000 MCG/ML injection Inject 1 mL (1,000 mcg total) into the muscle every 30 (thirty) days. 1 mL 10   cyclobenzaprine (FLEXERIL) 10 MG tablet Take 1 tablet (10 mg total) by mouth 3 (three) times daily as needed for muscle spasms. 60 tablet 6   diazepam (VALIUM) 2 MG tablet Take 1 tablet (2 mg total) by mouth every 8 (eight) hours as needed for anxiety. 30 tablet 0   EPINEPHrine 0.3 mg/0.3 mL IJ SOAJ injection Inject 0.3 mg into the muscle as needed for anaphylaxis. (Patient not taking: Reported on 08/21/2022) 0.6 mL 1   furosemide (LASIX) 20 MG tablet Take 1 tablet (20 mg total) by mouth daily. 30 tablet 3   gabapentin (NEURONTIN) 100 MG capsule Take 1 capsule (100 mg total) by mouth 3 (three) times daily as needed. (Patient not taking: Reported on 08/21/2022) 270 capsule 3   ibuprofen (ADVIL) 800 MG tablet Take 1 tablet (800 mg total) by mouth every 8 (eight) hours as needed. (Patient not taking: Reported on 08/21/2022) 90 tablet 6   influenza vac split quadrivalent PF (FLUARIX) 0.5 ML injection Inject into the muscle. 0.5 mL 0   meclizine (ANTIVERT) 25 MG tablet Take 1 tablet (25 mg total) by mouth 3 (three) times daily as needed for dizziness or nausea. (Patient not taking: Reported on 08/21/2022) 90 tablet 3   ondansetron (ZOFRAN-ODT) 4 MG disintegrating tablet Take 1 tablet (4 mg total) by mouth every 8 (eight) hours as needed for up to 20 doses for nausea or vomiting. (Patient not taking: Reported on 08/21/2022)  20 tablet 0   rizatriptan (MAXALT) 10 MG tablet Take 1 tablet (10 mg total) by mouth as needed for migraine. May repeat in 2 hours if needed 18 tablet 4   SYRINGE-NEEDLE, DISP, 3 ML (BD ECLIPSE SYRINGE/NEEDLE) 23G X 1" 3 ML MISC For B12 injection into muscle. 2 each 99   Vitamin D, Ergocalciferol, (DRISDOL) 1.25 MG (50000 UNIT) CAPS capsule Take 1 capsule by mouth twice a week. For vitamin D deficiency. 24 capsule 3   No current facility-administered medications for this visit.   No results found.  Review  of Systems:   A ROS was performed including pertinent positives and negatives as documented in the HPI.  Physical Exam :   Constitutional: NAD and appears stated age Neurological: Alert and oriented Psych: Appropriate affect and cooperative There were no vitals taken for this visit.   Comprehensive Musculoskeletal Exam:    Lateral joint line tenderness of the right knee with no tenderness medially.  No evidence of edema or effusion.  Active range of motion from 0 to 130 degrees without crepitus.  No instability with varus or valgus stress.  Negative Lachman and anterior/posterior drawer.  Flexion and extension strength 5/5. Tenderness of the right foot over the medial calcaneus extending to the plantar surface of the foot.  Distal Achilles and insertion are nontender.  Full active ankle ROM.  DP pulse 2+.  Neurosensory exam intact.  Imaging:   Xray (right knee 4 views): Negative  Xray (right foot 3 views): No evidence of acute bony abnormality.  Os supranaviculare present.  Small plantar calcaneal osteophyte.  I personally reviewed and interpreted the radiographs.   Assessment:   42 y.o. female with pain of the right knee and foot after sustaining a fall 3 months ago.  Her symptoms have not improved over that time and she has been unable to get relief.  Given her knee instability particularly, I would like to obtain an MRI for further assessment to rule out a ligamentous, chondral,  or meniscal injury.  I do believe the symptoms in her right foot are consistent with plantar fasciitis.  I have encouraged treatment with supportive footwear, anti-inflammatories, icing, and massage.  Will plan to reassess this when we see her back in for MRI review.  Plan :    -Obtain MRI of the right knee and return to clinic to review with Dr. Steward Drone     I personally saw and evaluated the patient, and participated in the management and treatment plan.  Hazle Nordmann, PA-C Orthopedics

## 2023-03-07 ENCOUNTER — Ambulatory Visit
Admission: RE | Admit: 2023-03-07 | Discharge: 2023-03-07 | Disposition: A | Discharge status: Home / Self Care | Payer: 59 | Source: Ambulatory Visit | Attending: Student | Admitting: Student

## 2023-03-07 DIAGNOSIS — M25561 Pain in right knee: Secondary | ICD-10-CM | POA: Diagnosis not present

## 2023-03-12 ENCOUNTER — Other Ambulatory Visit (HOSPITAL_BASED_OUTPATIENT_CLINIC_OR_DEPARTMENT_OTHER): Payer: Self-pay

## 2023-03-12 MED ORDER — INFLUENZA VIRUS VACC SPLIT PF (FLUZONE) 0.5 ML IM SUSY
0.5000 mL | PREFILLED_SYRINGE | Freq: Once | INTRAMUSCULAR | 0 refills | Status: AC
  Filled 2023-03-12: qty 0.5, 1d supply, fill #0

## 2023-03-13 ENCOUNTER — Ambulatory Visit (HOSPITAL_BASED_OUTPATIENT_CLINIC_OR_DEPARTMENT_OTHER): Payer: 59 | Admitting: Orthopaedic Surgery

## 2023-03-13 ENCOUNTER — Other Ambulatory Visit (HOSPITAL_BASED_OUTPATIENT_CLINIC_OR_DEPARTMENT_OTHER): Payer: Self-pay

## 2023-03-13 DIAGNOSIS — M722 Plantar fascial fibromatosis: Secondary | ICD-10-CM | POA: Diagnosis not present

## 2023-03-13 MED ORDER — MELOXICAM 15 MG PO TABS
15.0000 mg | ORAL_TABLET | Freq: Every day | ORAL | 0 refills | Status: AC
  Filled 2023-03-13: qty 14, 14d supply, fill #0

## 2023-03-13 NOTE — Progress Notes (Signed)
Chief Complaint: Right knee and foot pain        History of Present Illness:    03/13/2023: Presents today for right knee MRI follow-up as well as right foot   Elizabeth Franklin is a 42 y.o. female presenting today with pain in her right knee and right foot.  Patient reports that she had a fall on 11/17/2022 when she slipped on water on the bathroom floor.  During the fall, her right leg went underneath her body.  Her knee is the main concern today which she states continues to be moderately painful and does not feel stable.  She has had instances of buckling.  Pain is located mainly over the medial and lateral aspect.  This is worse with going up and down steps.  Her foot is painful on the medial and plantar aspects and is worse with weightbearing.  States that this is notably worse when she stands up in the morning.  For treatment she has tried ibuprofen, Flexeril, Voltaren gel, Biofreeze, icing and heating, as well as compression socks.  She is not getting any significant relief to this point.  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     Surgical History:   None   PMH/PSH/Family History/Social History/Meds/Allergies:         Past Medical History:  Diagnosis Date   Acute maxillary sinusitis 08/02/2020   Anxiety     Bilateral posterior neck pain 07/19/2020   Body mass index (BMI) 34.0-34.9, adult 07/19/2020   Cephalalgia 08/01/2020   Chronic headaches     Dermatitis 08/30/2020   Excessive or frequent menstruation 11/21/2013   Neck pain with history of cervical spinal surgery 08/01/2020   PONV (postoperative nausea and vomiting)     Screening mammogram for breast cancer 07/19/2020   Slow transit constipation 07/19/2020   Weight gain 07/19/2020             Past Surgical History:  Procedure Laterality Date   APPENDECTOMY       BREAST REDUCTION SURGERY Bilateral 02/27/2022    Procedure: BREAST REDUCTION WITH LIPOSUCTION;  Surgeon:  Peggye Form, DO;  Location: [facility];  Service: Plastics;  Laterality: Bilateral;   CERVICAL DISC SURGERY       DILITATION & CURRETTAGE/HYSTROSCOPY WITH THERMACHOICE ABLATION N/A 01/30/2014    Procedure: DILATATION & CURETTAGE/HYSTEROSCOPY WITH THERMACHOICE ABLATION;  Surgeon: Lazaro Arms, MD;  Location: [facility];  Service: Gynecology;  Laterality: N/A;  Total Ablation Therapy Time 9 minutes 5 seconds; 86-88 deg C   ENDOMETRIAL ABLATION       LAPAROSCOPIC BILATERAL SALPINGECTOMY Bilateral 01/30/2014    Procedure: LAPAROSCOPIC BILATERAL SALPINGECTOMY;  Surgeon: Lazaro Arms, MD;  Location: [facility];  Service: Gynecology;  Laterality: Bilateral;        Social History         Socioeconomic History   Marital status: Married      Spouse name: Xandrea Clarey   Number of children: 1   Years of education: Not on file   Highest education level: Not on file  Occupational History   Not on file  Tobacco Use   Smoking status: Former      Current packs/day: 0.00      Average packs/day: 0.3 packs/day for 1 year (0.3 ttl pk-yrs)      Types: Cigarettes      Start date: 01/25/1998      Quit  date: 01/26/1999      Years since quitting: 24.0   Smokeless tobacco: Never  Vaping Use   Vaping status: Never Used  Substance and Sexual Activity   Alcohol use: Yes      Comment: occassional   Drug use: No   Sexual activity: Yes      Partners: Male      Birth control/protection: Surgical      Comment: monogomous relationship  Other Topics Concern   Not on file  Social History Narrative   Not on file    Social Determinants of Health        Financial Resource Strain: Not on file  Food Insecurity: Not on file  Transportation Needs: Not on file  Physical Activity: Inactive (07/19/2020)    Exercise Vital Sign     Days of Exercise per Week: 0 days     Minutes of Exercise per Session: 0 min  Stress: Not on file  Social Connections: Not on file         Family History  Problem  Relation Age of Onset   Hypertension Mother     Mental illness Mother     Heart failure Father     Heart disease Father     Hypertension Father     Diabetes Brother     Hyperlipidemia Brother     Diabetes Paternal Grandmother     Heart disease Paternal Grandmother     Breast cancer Paternal Grandmother 37   Breast cancer Paternal Aunt 35        Allergies      Allergies  Allergen Reactions   Bee Venom Swelling   Codeine Nausea And Vomiting            Current Outpatient Medications  Medication Sig Dispense Refill   betamethasone dipropionate (DIPROLENE) 0.05 % ointment Apply topically 2 (two) times daily. To eczema rash. 45 g 3   cyanocobalamin (VITAMIN B12) 1000 MCG/ML injection Inject 1 mL (1,000 mcg total) into the muscle every 30 (thirty) days. 1 mL 10   cyclobenzaprine (FLEXERIL) 10 MG tablet Take 1 tablet (10 mg total) by mouth 3 (three) times daily as needed for muscle spasms. 60 tablet 6   diazepam (VALIUM) 2 MG tablet Take 1 tablet (2 mg total) by mouth every 8 (eight) hours as needed for anxiety. 30 tablet 0   EPINEPHrine 0.3 mg/0.3 mL IJ SOAJ injection Inject 0.3 mg into the muscle as needed for anaphylaxis. (Patient not taking: Reported on 08/21/2022) 0.6 mL 1   furosemide (LASIX) 20 MG tablet Take 1 tablet (20 mg total) by mouth daily. 30 tablet 3   gabapentin (NEURONTIN) 100 MG capsule Take 1 capsule (100 mg total) by mouth 3 (three) times daily as needed. (Patient not taking: Reported on 08/21/2022) 270 capsule 3   ibuprofen (ADVIL) 800 MG tablet Take 1 tablet (800 mg total) by mouth every 8 (eight) hours as needed. (Patient not taking: Reported on 08/21/2022) 90 tablet 6   influenza vac split quadrivalent PF (FLUARIX) 0.5 ML injection Inject into the muscle. 0.5 mL 0   meclizine (ANTIVERT) 25 MG tablet Take 1 tablet (25 mg total) by mouth 3 (three) times daily as needed for dizziness or nausea. (Patient not taking: Reported on 08/21/2022) 90 tablet 3   ondansetron  (ZOFRAN-ODT) 4 MG disintegrating tablet Take 1 tablet (4 mg total) by mouth every 8 (eight) hours as needed for up to 20 doses for nausea or vomiting. (Patient not taking: Reported on  08/21/2022) 20 tablet 0   rizatriptan (MAXALT) 10 MG tablet Take 1 tablet (10 mg total) by mouth as needed for migraine. May repeat in 2 hours if needed 18 tablet 4   SYRINGE-NEEDLE, DISP, 3 ML (BD ECLIPSE SYRINGE/NEEDLE) 23G X 1" 3 ML MISC For B12 injection into muscle. 2 each 99   Vitamin D, Ergocalciferol, (DRISDOL) 1.25 MG (50000 UNIT) CAPS capsule Take 1 capsule by mouth twice a week. For vitamin D deficiency. 24 capsule 3      No current facility-administered medications for this visit.      Imaging Results (Last 48 hours)  No results found.     Review of Systems:   A ROS was performed including pertinent positives and negatives as documented in the HPI.   Physical Exam :   Constitutional: NAD and appears stated age Neurological: Alert and oriented Psych: Appropriate affect and cooperative There were no vitals taken for this visit.    Comprehensive Musculoskeletal Exam:     Lateral joint line tenderness of the right knee with no tenderness medially.  No evidence of edema or effusion.  Active range of motion from 0 to 130 degrees without crepitus.  No instability with varus or valgus stress.  Negative Lachman and anterior/posterior drawer.  Flexion and extension strength 5/5. Tenderness of the right foot over the medial calcaneus extending to the plantar surface of the foot.  Distal Achilles and insertion are nontender.  Full active ankle ROM.  DP pulse 2+.  Neurosensory exam intact.   Tenderness to palpation at the medial plantar fascia of the right heel.  Right knee with tenderness about the posterior lateral knee/tibia   Imaging:   Xray (right knee 4 views): Negative   Xray (right foot 3 views): No evidence of acute bony abnormality.  Os supranaviculare present.  Small plantar calcaneal  osteophyte.  MRI right knee: Posterior lateral bony contusion consistent with a tibial plateau type mechanism   I personally reviewed and interpreted the radiographs.     Assessment:   42 y.o. female with pain of the right knee and foot after sustaining a fall 3 months ago.  Overall I did describe that her MRI today shows evidence of bony contusion which I do believe is being exacerbated by a fairly significant amount of plantar fasciitis of the right foot.  This time we will plan for referral to Dr. Shon Baton for shockwave therapy of the right heel as she has trialed a frozen water bottle as well as a small racquetball for stretching and this without any relief.  Regard to the right knee I did describe that she does have a posterior lateral bone bruise without any evidence of a meniscal tear.  Given this I have written her for prescription of 2 weeks of Mobic.  I do believe that working on her foot will also help her knee as well.  With that being said I do also believe that the bony contusion will slowly continue to improve although this will fluctuate as is common with fracture type injuries. Plan :     -Plan for 2-week course of Mobic and referral to Dr. Shon Baton, I did also discuss the possibility of a knee intra-articular injection as needed         I personally saw and evaluated the patient, and participated in the management and treatment plan.

## 2023-03-13 NOTE — Addendum Note (Signed)
Addended by: Jeanella Cara on: 03/13/2023 02:35 PM   Modules accepted: Orders

## 2023-03-27 ENCOUNTER — Ambulatory Visit: Payer: 59 | Admitting: Sports Medicine

## 2023-04-03 ENCOUNTER — Ambulatory Visit: Payer: 59 | Admitting: Sports Medicine

## 2023-04-03 ENCOUNTER — Encounter: Payer: Self-pay | Admitting: Sports Medicine

## 2023-04-03 DIAGNOSIS — M2141 Flat foot [pes planus] (acquired), right foot: Secondary | ICD-10-CM | POA: Diagnosis not present

## 2023-04-03 DIAGNOSIS — M722 Plantar fascial fibromatosis: Secondary | ICD-10-CM

## 2023-04-03 DIAGNOSIS — M2142 Flat foot [pes planus] (acquired), left foot: Secondary | ICD-10-CM | POA: Diagnosis not present
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
## 2023-04-03 NOTE — Progress Notes (Signed)
Patient says that she had a fall in July and her foot pain has gotten worse in the time since then. She says that first thing in the morning is the most painful, as well as standing up after sitting for dinner in the evenings. She has been using a trigger point roller in the mornings and the evenings which helps some. She says that she has been taking Mobic and has one dose left, as well as daily 800 mg of Ibuprofen. She says that she does not have any stretches or exercises for the foot at this time. Pain is more so on the heel and arch of the right foot.  Patient was instructed in 10 minutes of therapeutic exercises for right foot to improve strength, ROM and function according to my instructions and plan of care by a Certified Athletic Trainer during the office visit. A customized handout was provided and demonstration of proper technique shown and discussed. Patient did perform exercises and demonstrate understanding through teachback.  All questions discussed and answered.

## 2023-04-06 IMAGING — MG MM DIGITAL SCREENING BILAT W/ TOMO AND CAD
8 series · 8 of 24 positions shown · non-contrast
Comparison: None.

CLINICAL DATA: Screening.

EXAM:
DIGITAL SCREENING BILATERAL MAMMOGRAM WITH TOMOSYNTHESIS AND CAD
TECHNIQUE: Bilateral screening digital craniocaudal and mediolateral oblique
mammograms were obtained. Bilateral screening digital breast
tomosynthesis was performed. The images were evaluated with
computer-aided detection.

[R MLO synth-2D]
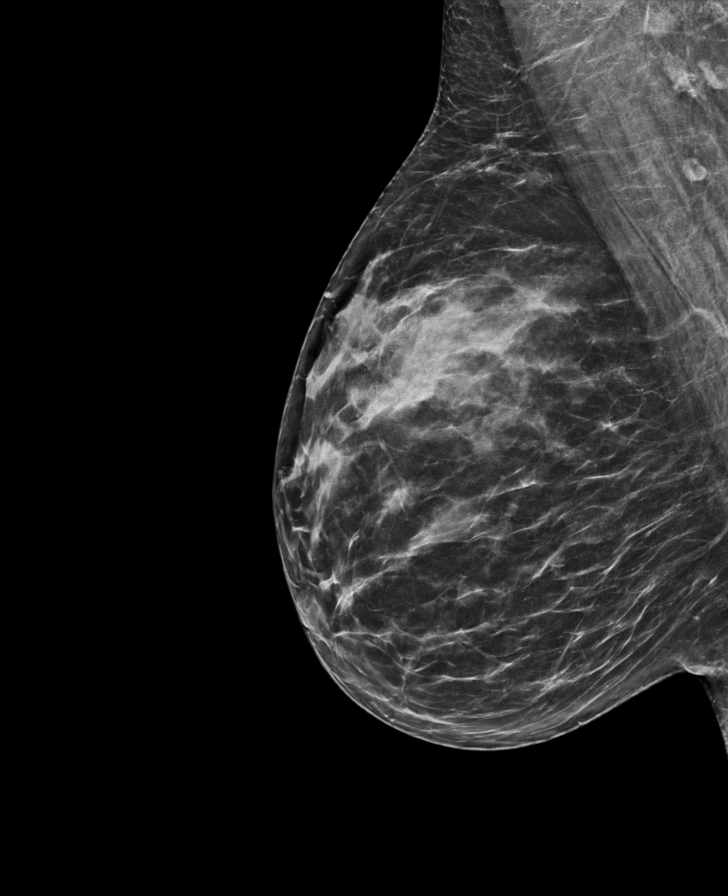

[R CC synth-2D]
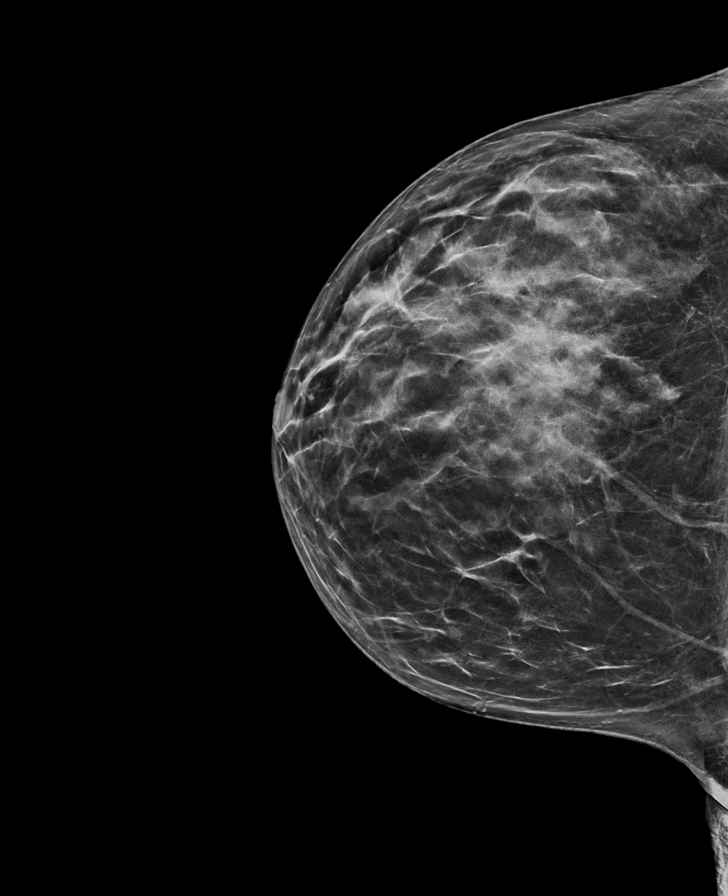

[L MLO synth-2D]
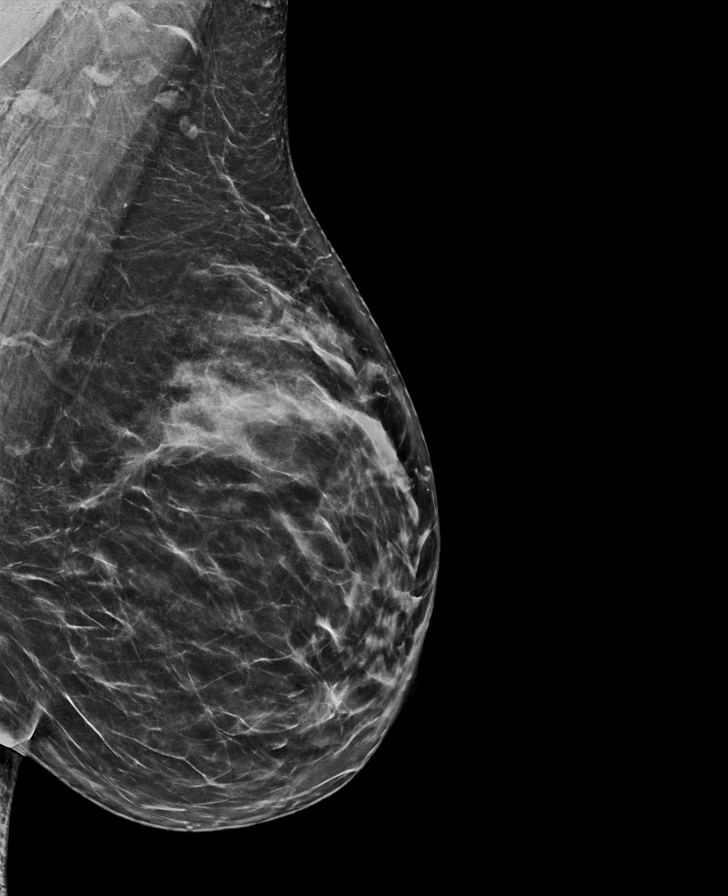

[L CC synth-2D]
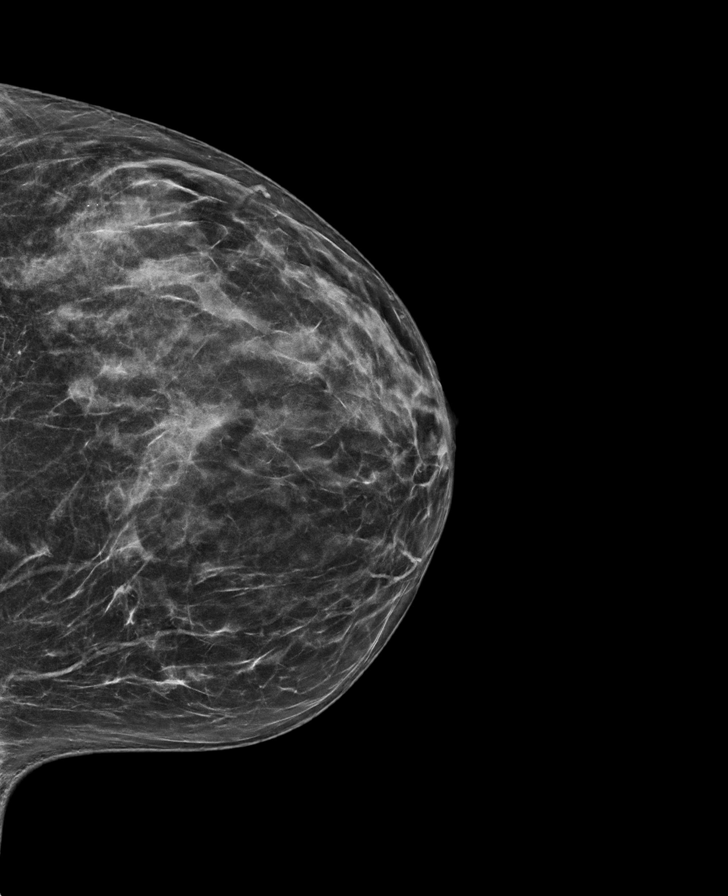

[L MLO tomo · tomo slice 36/71.0]
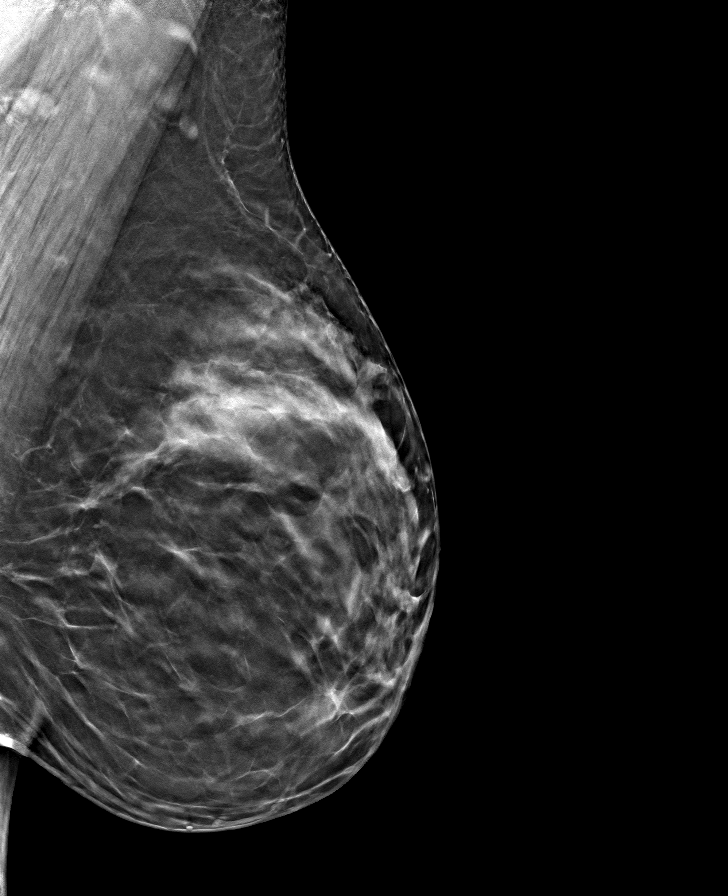

[R MLO tomo · tomo slice 35/70.0]
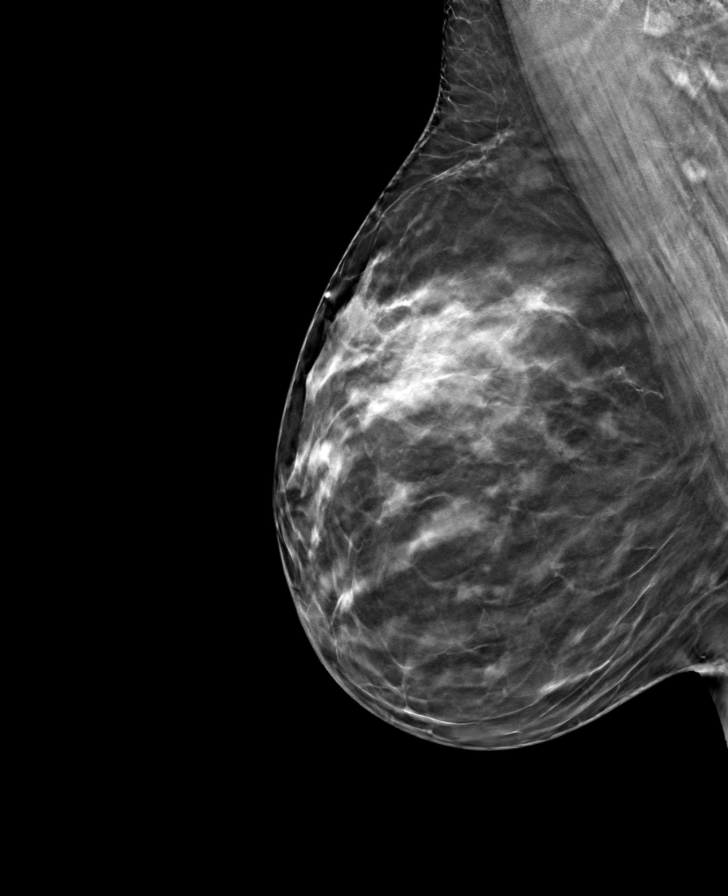

[L CC tomo · tomo slice 33/65.0]
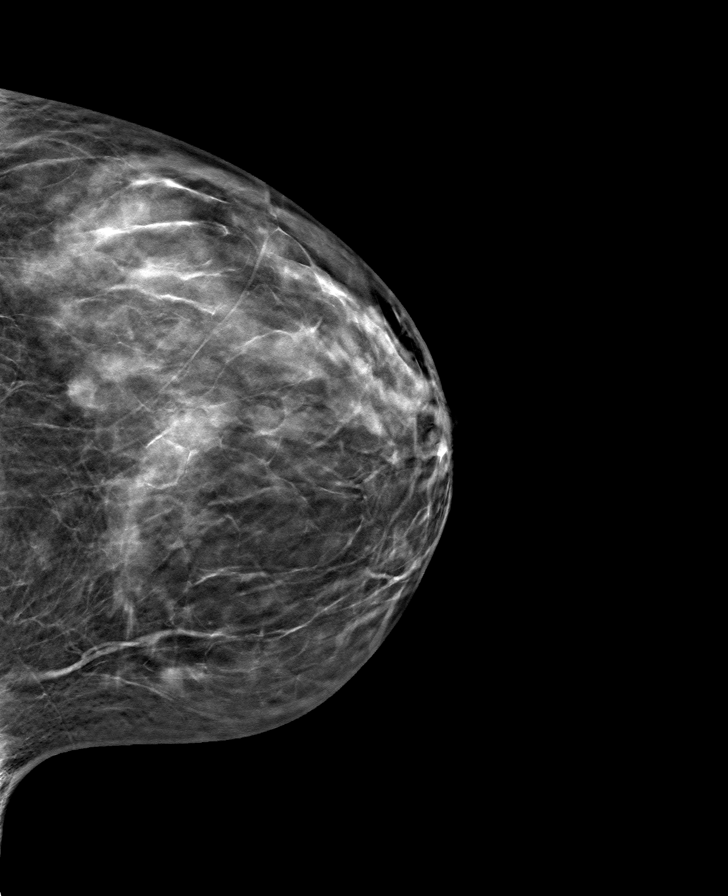

[R CC tomo · tomo slice 33/65.0]
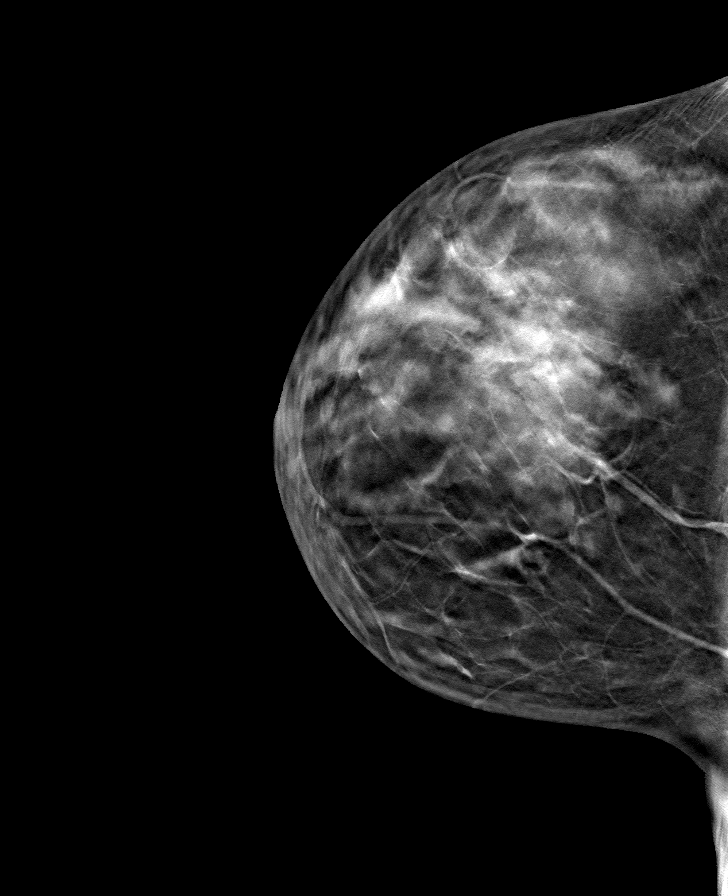

[8 of 24 positions shown; findings below may reference images not displayed]

ACR Breast Density Category c: The breast tissue is heterogeneously
dense, which may obscure small masses
FINDINGS: There are no findings suspicious for malignancy.
IMPRESSION: No mammographic evidence of malignancy. A result letter of this
screening mammogram will be mailed directly to the patient.

RECOMMENDATION:
Screening mammogram in one year. (Code:C8-T-HNK)

BI-RADS CATEGORY  1: Negative.

## 2023-04-07 ENCOUNTER — Other Ambulatory Visit (HOSPITAL_BASED_OUTPATIENT_CLINIC_OR_DEPARTMENT_OTHER): Payer: Self-pay

## 2023-04-07 ENCOUNTER — Other Ambulatory Visit: Payer: Self-pay | Admitting: Nurse Practitioner

## 2023-04-07 MED ORDER — FUROSEMIDE 20 MG PO TABS
20.0000 mg | ORAL_TABLET | Freq: Every day | ORAL | 3 refills | Status: AC
  Filled 2023-04-07: qty 30, 30d supply, fill #0

## 2023-04-10 ENCOUNTER — Encounter (HOSPITAL_BASED_OUTPATIENT_CLINIC_OR_DEPARTMENT_OTHER): Payer: Self-pay | Admitting: Radiology

## 2023-04-10 ENCOUNTER — Ambulatory Visit (HOSPITAL_BASED_OUTPATIENT_CLINIC_OR_DEPARTMENT_OTHER)
Admission: RE | Admit: 2023-04-10 | Discharge: 2023-04-10 | Disposition: A | Discharge status: Home / Self Care | Payer: 59 | Source: Ambulatory Visit | Attending: Nurse Practitioner | Admitting: Nurse Practitioner

## 2023-04-10 ENCOUNTER — Other Ambulatory Visit (HOSPITAL_BASED_OUTPATIENT_CLINIC_OR_DEPARTMENT_OTHER): Payer: Self-pay | Admitting: Nurse Practitioner

## 2023-04-10 DIAGNOSIS — Z1231 Encounter for screening mammogram for malignant neoplasm of breast: Secondary | ICD-10-CM | POA: Insufficient documentation

## 2023-04-10 DIAGNOSIS — Z1239 Encounter for other screening for malignant neoplasm of breast: Secondary | ICD-10-CM | POA: Diagnosis not present

## 2023-04-13 ENCOUNTER — Ambulatory Visit: Payer: 59 | Admitting: Sports Medicine

## 2023-05-17 IMAGING — CR DG CHEST 2V
2 series · 2 of 2 positions shown · non-contrast
Comparison: Chest radiograph 05/22/2014

CLINICAL DATA: Chest pain

EXAM:
CHEST - 2 VIEW

[chest pa]
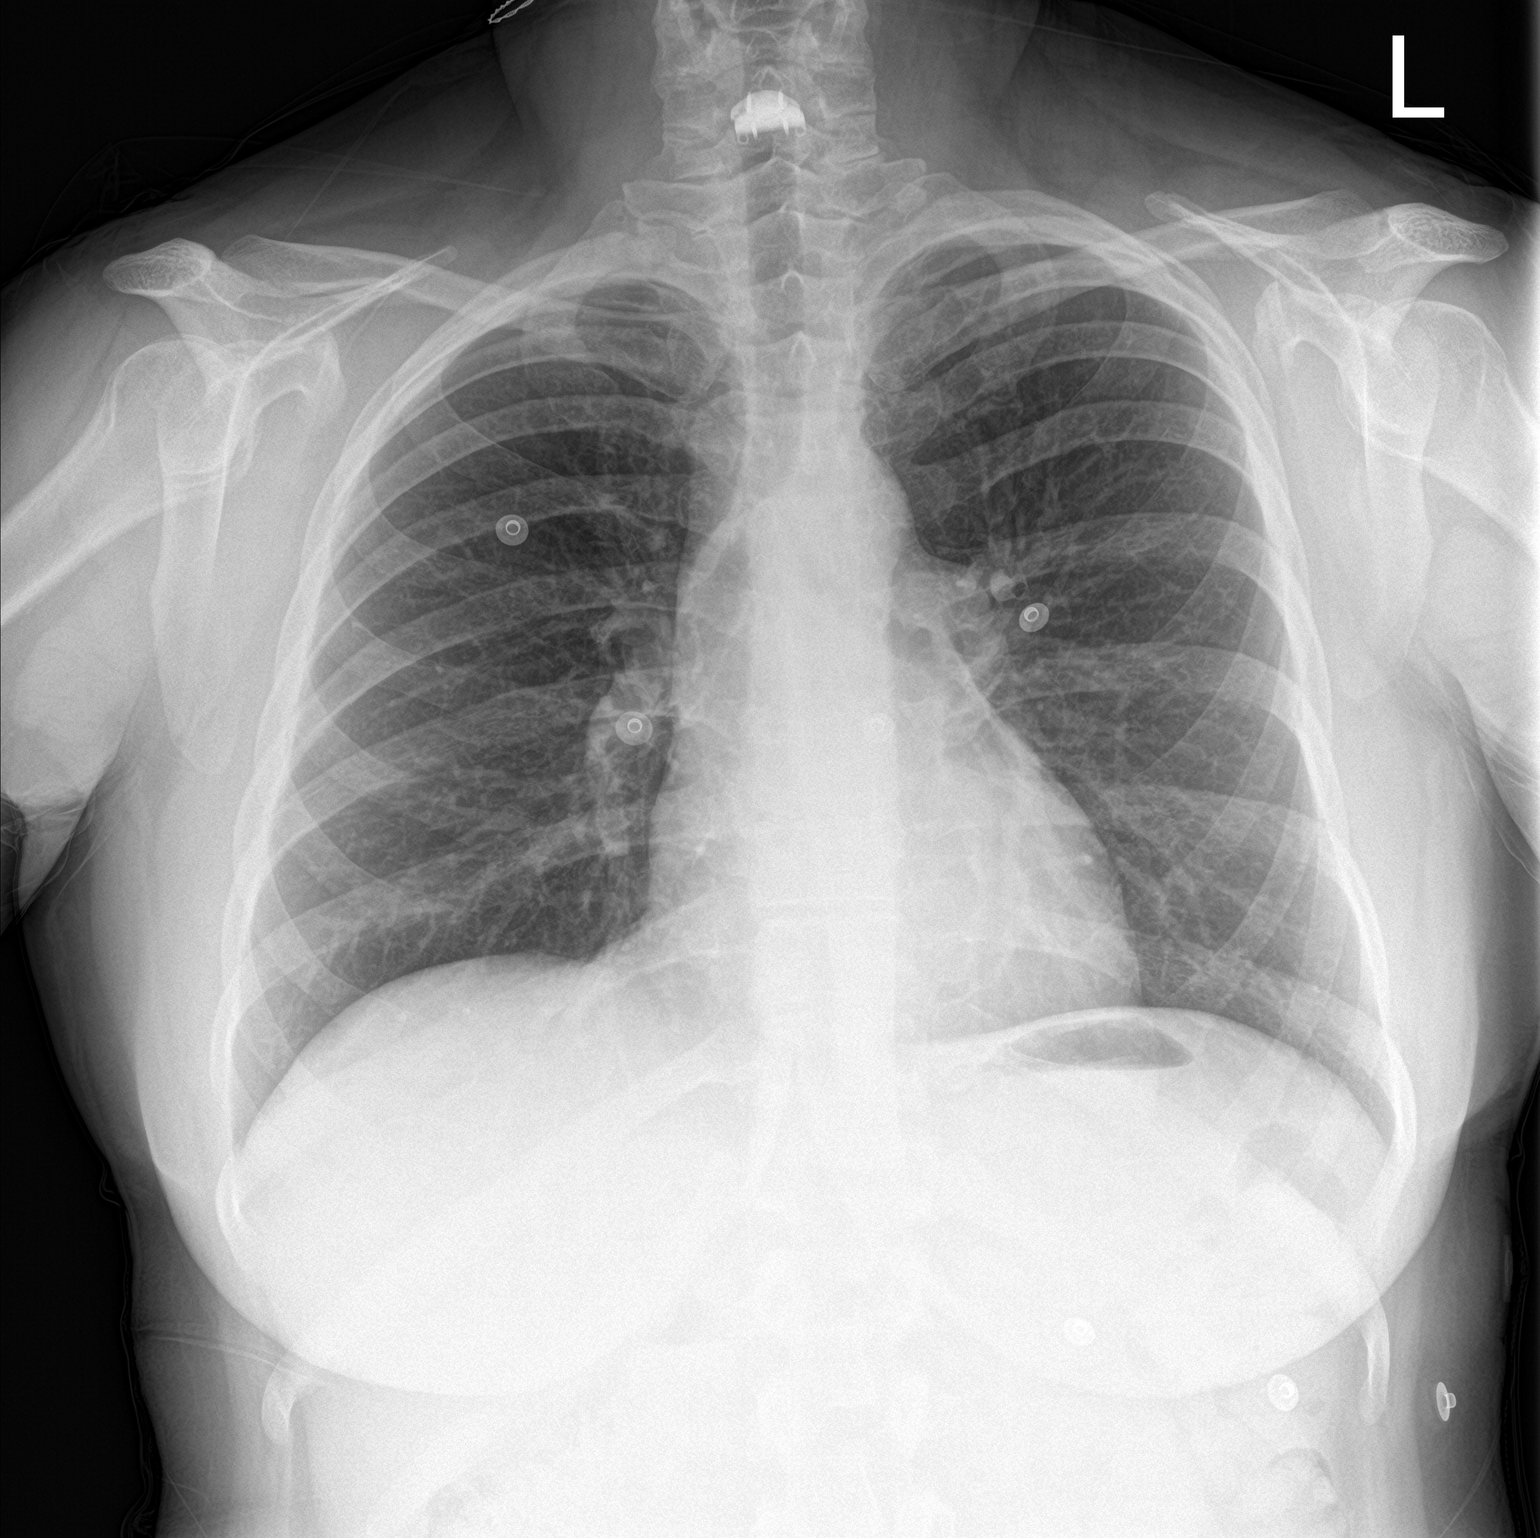

[chest lat]
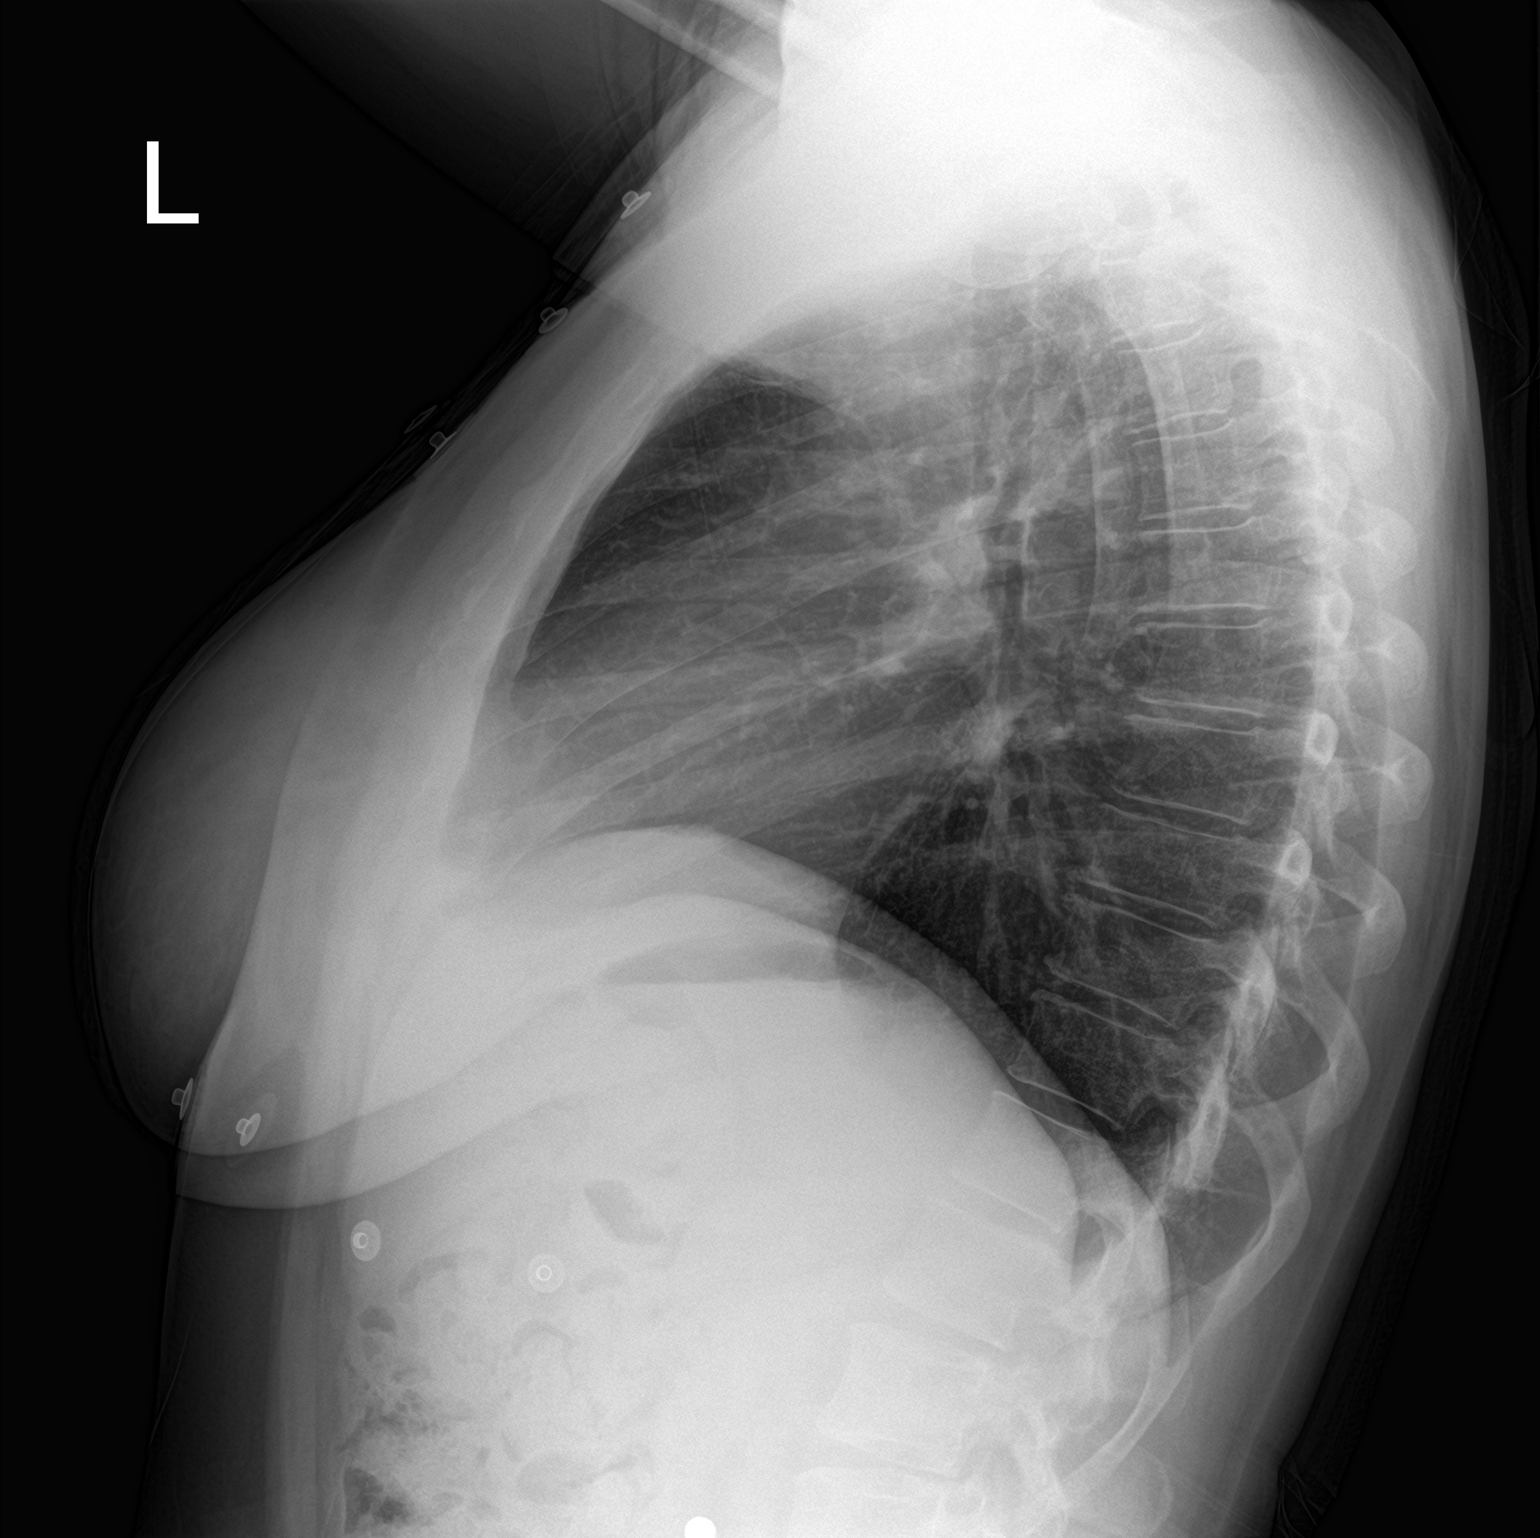

[2 of 2 positions shown; findings below may reference images not displayed]

FINDINGS: The cardiomediastinal silhouette is normal.

The lungs clear, without focal consolidation or pulmonary edema.
There is no pleural effusion or pneumothorax.

There is no acute osseous abnormality.
IMPRESSION: No radiographic evidence of acute cardiopulmonary process.

## 2023-05-27 ENCOUNTER — Ambulatory Visit
Admission: RE | Admit: 2023-05-27 | Discharge: 2023-05-27 | Disposition: A | Discharge status: Home / Self Care | Payer: BC Managed Care – PPO | Source: Ambulatory Visit | Attending: Family Medicine | Admitting: Family Medicine

## 2023-05-27 VITALS — BP 143/92 | HR 98 | Temp 99.0°F | Resp 18 | Ht 68.0 in | Wt 215.0 lb

## 2023-05-27 DIAGNOSIS — R509 Fever, unspecified: Secondary | ICD-10-CM | POA: Insufficient documentation

## 2023-05-27 DIAGNOSIS — B349 Viral infection, unspecified: Secondary | ICD-10-CM | POA: Diagnosis not present

## 2023-05-27 LAB — POCT INFLUENZA A/B
Influenza A, POC: NEGATIVE
Influenza B, POC: NEGATIVE
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
## 2023-05-27 NOTE — ED Triage Notes (Signed)
 Pt presents with complaints of fever over the last 24 hours. Generalized body aches, headaches, cough, and congestion also reported. Pt has been taking Theraflu and alternating Tylenol  & Ibuprofen  with little to no improvement. Pt states her temperature this morning was 100.4 before taking medications.

## 2023-05-28 LAB — SARS CORONAVIRUS 2 (TAT 6-24 HRS): SARS Coronavirus 2: NEGATIVE

## 2023-06-25 ENCOUNTER — Other Ambulatory Visit (HOSPITAL_BASED_OUTPATIENT_CLINIC_OR_DEPARTMENT_OTHER): Payer: Self-pay

## 2023-06-25 MED ORDER — ONDANSETRON 4 MG PO TBDP
4.0000 mg | ORAL_TABLET | Freq: Three times a day (TID) | ORAL | 3 refills | Status: AC | PRN
  Filled 2023-06-25: qty 30, 10d supply, fill #0

## 2023-06-29 ENCOUNTER — Ambulatory Visit (INDEPENDENT_AMBULATORY_CARE_PROVIDER_SITE_OTHER): Payer: BC Managed Care – PPO | Admitting: Nurse Practitioner

## 2023-06-29 VITALS — BP 120/82 | HR 92 | Ht 68.0 in | Wt 226.8 lb

## 2023-06-29 DIAGNOSIS — Z0000 Encounter for general adult medical examination without abnormal findings: Secondary | ICD-10-CM

## 2023-06-29 DIAGNOSIS — F411 Generalized anxiety disorder: Secondary | ICD-10-CM | POA: Diagnosis not present

## 2023-06-29 DIAGNOSIS — R635 Abnormal weight gain: Secondary | ICD-10-CM

## 2023-06-29 MED ORDER — ESCITALOPRAM OXALATE 10 MG PO TABS: ORAL_TABLET | ORAL | 1 refills | Status: DC

## 2023-06-29 NOTE — Progress Notes (Signed)
Shawna Clamp, DNP, AGNP-c [facility] [address] [place], [place] [address] Main Office [phone]  BP 120/82   Pulse 92   Ht 5\' 8"  (1.727 m)   Wt 226 lb 12.8 oz (102.9 kg)   BMI 34.48 kg/m    Subjective:    Patient ID: Elizabeth Franklin, female    DOB: [birth year], 43 y.o.   MRN: [id number]  HPI: Elizabeth Franklin is a 43 y.o. female presenting on 06/29/2023 for comprehensive medical examination.   History of Present Illness Elizabeth Franklin is a 43 year old female who presents with stress and anxiety related to work conditions.  She experiences significant stress and anxiety related to her work environment. Her job as a Merchandiser, retail at two locations, [employer], has become increasingly demanding, requiring her to fill in for staff shortages and manage a large list of responsibilities. She feels overwhelmed by the workload and the lack of support from her new manager, leading to feelings of being 'so stressed out I can't even think about it' and fear of 'snapping on somebody.'   She has previously tried Ativan for anxiety but found it ineffective as it made her sleepy, which is not feasible during work hours. She expresses a need for a long-acting medication to manage her stress and anxiety. She also mentions a past trial of Lexapro, which she felt was effective but discontinued because she was doing better.    Her workload has led to long hours with continuous days in a row to help cover for short staffing in various locations. She is considering a career change due to the stress at her current job and has an upcoming interview for a Proofreader position. She is also contemplating taking a leave of absence for mental health reasons, as she feels unable to take care of herself and maintain a healthy routine due to her current work demands.  She describes a recent illness that began three weeks ago with sudden fever and fatigue, which was not  diagnosed as flu or COVID. Last week, she experienced a day of severe vomiting after eating dinner, which she initially attributed to a potential norovirus or food poisoning, as it lasted about 12 hours. She notes that she had started a new weight loss medication, compounded Zepbound, the same day she got sick, but the pharmacy assured her it was unlikely to cause such symptoms so quickly.  She has a history of using weight loss medications, including Wegovy and Ozempic, with varying results. Reginal Lutes was effective initially but later caused severe headaches and eventually stopped working. She is currently hesitant to continue Zepbound due to the vomiting episode but acknowledges that it has suppressed her appetite significantly, as she has been eating very little since starting it.  Pertinent items are noted in HPI.   Most Recent Depression Screen:     06/29/2023    3:19 PM 12/29/2022    3:53 PM 08/21/2022    2:56 PM 08/14/2021    8:36 AM 07/19/2020    3:39 PM  Depression screen PHQ 2/9  Decreased Interest 0 0 0 0 0  Down, Depressed, Hopeless 0 0 0 0 0  PHQ - 2 Score 0 0 0 0 0  Altered sleeping     3  Tired, decreased energy     2  Change in appetite     0  Feeling bad or failure about yourself      0  Trouble concentrating     2  Moving slowly or fidgety/restless     2  Suicidal thoughts     0  PHQ-9 Score     9  Difficult doing work/chores     Somewhat difficult   Most Recent Anxiety Screen:     07/19/2020    3:40 PM  GAD 7 : Generalized Anxiety Score  Nervous, Anxious, on Edge 1  Control/stop worrying 1  Worry too much - different things 1  Trouble relaxing 1  Restless 1  Easily annoyed or irritable 1  Afraid - awful might happen 0  Total GAD 7 Score 6  Anxiety Difficulty Somewhat difficult   Most Recent Fall Screen:    06/29/2023    3:19 PM 08/21/2022    2:55 PM 08/14/2021    8:35 AM 07/19/2020    3:39 PM  Fall Risk   Falls in the past year? 0 0 0 0  Number falls in past  yr: 0 0 0 0  Injury with Fall? 0 0 0   Risk for fall due to : No Fall Risks No Fall Risks No Fall Risks No Fall Risks  Follow up Falls evaluation completed Falls evaluation completed Falls evaluation completed;Education provided Falls evaluation completed    Past medical history, surgical history, medications, allergies, family history and social history reviewed with patient today and changes made to appropriate areas of the chart.  Past Medical History:  Past Medical History:  Diagnosis Date   Acute maxillary sinusitis 08/02/2020   Anxiety    Bilateral posterior neck pain 07/19/2020   Body mass index (BMI) 34.0-34.9, adult 07/19/2020   Cephalalgia 08/01/2020   Chronic headaches    Dermatitis 08/30/2020   Excessive or frequent menstruation 11/21/2013   Neck pain with history of cervical spinal surgery 08/01/2020   PONV (postoperative nausea and vomiting)    Screening mammogram for breast cancer 07/19/2020   Slow transit constipation 07/19/2020   Weight gain 07/19/2020   Medications:  Current Outpatient Medications on File Prior to Visit  Medication Sig   betamethasone dipropionate (DIPROLENE) 0.05 % ointment Apply topically 2 (two) times daily. To eczema rash.   cyanocobalamin (VITAMIN B12) 1000 MCG/ML injection Inject 1 mL (1,000 mcg total) into the muscle every 30 (thirty) days.   cyclobenzaprine (FLEXERIL) 10 MG tablet Take 1 tablet (10 mg total) by mouth 3 (three) times daily as needed for muscle spasms.   EPINEPHrine 0.3 mg/0.3 mL IJ SOAJ injection Inject 0.3 mg into the muscle as needed for anaphylaxis.   furosemide (LASIX) 20 MG tablet Take 1 tablet (20 mg total) by mouth daily.   gabapentin (NEURONTIN) 100 MG capsule Take 1 capsule (100 mg total) by mouth 3 (three) times daily as needed.   ibuprofen (ADVIL) 800 MG tablet Take 1 tablet (800 mg total) by mouth every 8 (eight) hours as needed.   meclizine (ANTIVERT) 25 MG tablet Take 1 tablet (25 mg total) by mouth 3 (three)  times daily as needed for dizziness or nausea.   meloxicam (MOBIC) 15 MG tablet Take 1 tablet (15 mg total) by mouth daily.   ondansetron (ZOFRAN-ODT) 4 MG disintegrating tablet Take 1 tablet (4 mg total) by mouth every 8 (eight) hours as needed for up to 20 doses for nausea or vomiting.   ondansetron (ZOFRAN-ODT) 4 MG disintegrating tablet Take 1 tablet (4 mg total) by mouth every 8 (eight) hours as needed.   rizatriptan (MAXALT) 10 MG tablet Take 1 tablet (10 mg total) by mouth as needed for migraine. May  repeat in 2 hours if needed   SYRINGE-NEEDLE, DISP, 3 ML (BD ECLIPSE SYRINGE/NEEDLE) 23G X 1" 3 ML MISC For B12 injection into muscle.   Vitamin D, Ergocalciferol, (DRISDOL) 1.25 MG (50000 UNIT) CAPS capsule Take 1 capsule by mouth twice a week. For vitamin D deficiency.   influenza vac split quadrivalent PF (FLUARIX) 0.5 ML injection Inject into the muscle. (Patient not taking: Reported on 06/29/2023)   No current facility-administered medications on file prior to visit.   Surgical History:  Past Surgical History:  Procedure Laterality Date   APPENDECTOMY     BREAST REDUCTION SURGERY Bilateral 02/27/2022   Procedure: BREAST REDUCTION WITH LIPOSUCTION;  Surgeon: Peggye Form, DO;  Location: [facility];  Service: Plastics;  Laterality: Bilateral;   CERVICAL DISC SURGERY     DILITATION & CURRETTAGE/HYSTROSCOPY WITH THERMACHOICE ABLATION N/A 01/30/2014   Procedure: DILATATION & CURETTAGE/HYSTEROSCOPY WITH THERMACHOICE ABLATION;  Surgeon: Lazaro Arms, MD;  Location: [facility];  Service: Gynecology;  Laterality: N/A;  Total Ablation Therapy Time 9 minutes 5 seconds; 86-88 deg C   ENDOMETRIAL ABLATION     LAPAROSCOPIC BILATERAL SALPINGECTOMY Bilateral 01/30/2014   Procedure: LAPAROSCOPIC BILATERAL SALPINGECTOMY;  Surgeon: Lazaro Arms, MD;  Location: [facility];  Service: Gynecology;  Laterality: Bilateral;   Allergies:  Allergies  Allergen Reactions   Bee Venom Swelling    Codeine Nausea And Vomiting   Family History:  Family History  Problem Relation Age of Onset   Hypertension Mother    Mental illness Mother    Heart failure Father    Heart disease Father    Hypertension Father    Diabetes Brother    Hyperlipidemia Brother    Diabetes Paternal Grandmother    Heart disease Paternal Grandmother    Breast cancer Paternal Grandmother 41   Breast cancer Paternal Aunt 35       Objective:    BP 120/82   Pulse 92   Ht 5\' 8"  (1.727 m)   Wt 226 lb 12.8 oz (102.9 kg)   BMI 34.48 kg/m   Wt Readings from Last 3 Encounters:  06/29/23 226 lb 12.8 oz (102.9 kg)  05/27/23 215 lb (97.5 kg)  12/29/22 226 lb 6.4 oz (102.7 kg)    Physical Exam Vitals and nursing note reviewed.  Constitutional:      General: She is not in acute distress.    Appearance: Normal appearance.  HENT:     Head: Normocephalic and atraumatic.     Right Ear: Hearing, tympanic membrane, ear canal and external ear normal.     Left Ear: Hearing, tympanic membrane, ear canal and external ear normal.     Nose: Nose normal.     Right Sinus: No maxillary sinus tenderness or frontal sinus tenderness.     Left Sinus: No maxillary sinus tenderness or frontal sinus tenderness.     Mouth/Throat:     Lips: Pink.     Mouth: Mucous membranes are moist.     Pharynx: Oropharynx is clear.  Eyes:     General: Lids are normal. Vision grossly intact.     Extraocular Movements: Extraocular movements intact.     Conjunctiva/sclera: Conjunctivae normal.     Pupils: Pupils are equal, round, and reactive to light.     Funduscopic exam:    Right eye: Red reflex present.        Left eye: Red reflex present.    Visual Fields: Right eye visual fields normal and left eye visual  fields normal.  Neck:     Thyroid: No thyromegaly.     Vascular: No carotid bruit.  Cardiovascular:     Rate and Rhythm: Normal rate and regular rhythm.     Chest Wall: PMI is not displaced.     Pulses: Normal pulses.           Dorsalis pedis pulses are 2+ on the right side and 2+ on the left side.       Posterior tibial pulses are 2+ on the right side and 2+ on the left side.     Heart sounds: Normal heart sounds. No murmur heard. Pulmonary:     Effort: Pulmonary effort is normal. No respiratory distress.     Breath sounds: Normal breath sounds.  Abdominal:     General: Abdomen is flat. Bowel sounds are normal. There is no distension.     Palpations: Abdomen is soft. There is no hepatomegaly, splenomegaly or mass.     Tenderness: There is no abdominal tenderness. There is no right CVA tenderness, left CVA tenderness, guarding or rebound.  Musculoskeletal:        General: Normal range of motion.     Cervical back: Full passive range of motion without pain, normal range of motion and neck supple. No tenderness.     Right lower leg: No edema.     Left lower leg: No edema.  Feet:     Left foot:     Toenail Condition: Left toenails are normal.  Lymphadenopathy:     Cervical: No cervical adenopathy.     Upper Body:     Right upper body: No supraclavicular adenopathy.     Left upper body: No supraclavicular adenopathy.  Skin:    General: Skin is warm and dry.     Capillary Refill: Capillary refill takes less than 2 seconds.     Nails: There is no clubbing.  Neurological:     General: No focal deficit present.     Mental Status: She is alert and oriented to person, place, and time.     GCS: GCS eye subscore is 4. GCS verbal subscore is 5. GCS motor subscore is 6.     Sensory: Sensation is intact.     Motor: Motor function is intact.     Coordination: Coordination is intact.     Gait: Gait is intact.     Deep Tendon Reflexes: Reflexes are normal and symmetric.  Psychiatric:        Attention and Perception: Attention normal.        Mood and Affect: Mood normal.        Speech: Speech normal.        Behavior: Behavior normal. Behavior is cooperative.        Thought Content: Thought content normal.         Cognition and Memory: Cognition and memory normal.        Judgment: Judgment normal.      Results for orders placed or performed during the hospital encounter of 05/27/23  SARS CORONAVIRUS 2 (TAT 6-24 HRS) Anterior Nasal Swab   Collection Time: 05/27/23  9:02 AM   Specimen: Anterior Nasal Swab  Result Value Ref Range   SARS Coronavirus 2 NEGATIVE NEGATIVE  POCT Influenza A/B   Collection Time: 05/27/23  9:19 AM  Result Value Ref Range   Influenza A, POC Negative    Influenza B, POC Negative        Assessment & Plan:   Problem  List Items Addressed This Visit     Weight gain   Struggling with weight loss, tried various medications including Wegovy and Ozempic with mixed results. Recently started Zepbound but experienced severe vomiting, possibly due to a viral illness or food poisoning. Hesitant to continue medication due to adverse reaction. Discussed the potential benefits of Zepbound, including better results compared to previous medications, and the importance of protein intake. Informed about the use of Zofran for nausea and the need to monitor for further adverse reactions. - Continue Zepbound with caution - Use Zofran as needed for nausea - Encourage protein intake through shakes or high-protein foods - Monitor for any further adverse reactions      Relevant Orders   CBC with Differential/Platelet   CMP14+EGFR   Hemoglobin A1c   Lipid panel   TSH   Encounter for annual physical exam - Primary   CPE completed today. Review of HM activities and recommendations discussed and provided on AVS. Anticipatory guidance, diet, and exercise recommendations provided. Medications, allergies, and hx reviewed and updated as necessary. Orders placed as listed below.  Plan: - Labs ordered. Will make changes as necessary based on results.  - I will review these results and send recommendations via MyChart or a telephone call.  - F/U with CPE in 1 year or sooner for acute/chronic health  needs as directed.        Relevant Orders   CBC with Differential/Platelet   CMP14+EGFR   Hemoglobin A1c   Lipid panel   TSH   Generalized anxiety disorder   Reports significant stress and anxiety related to work environment, including being overworked and underappreciated. Experiences difficulty focusing, irritability, and a sense of being overwhelmed. Previously tried Ativan without success and is seeking a long-acting medication. Lexapro was effective but discontinued due to feeling indifferent. Discussed potential benefits of Lexapro for long-term anxiety management and the possibility of FMLA for mental health. Informed about potential side effects and the need for monitoring. - Prescribe Lexapro - Monitor for effectiveness and side effects - Consider FMLA for mental health if needed      Relevant Medications   escitalopram (LEXAPRO) 10 MG tablet   Other Relevant Orders   CBC with Differential/Platelet   CMP14+EGFR   Hemoglobin A1c   Lipid panel   TSH    General Health Maintenance Generally healthy but experiencing significant stress and anxiety. Discussed the importance of regular physical activity and potential benefits of career counseling. - Encourage regular physical activity, such as walking - Consider career counseling or job change for better work-life balance  Follow-up - Follow up in 2 weeks to assess the effectiveness of Lexapro - Discuss FMLA paperwork if she decides to proceed - Encourage her to keep in touch regarding any new symptoms or concerns.  Follow up plan: Return in about 1 year (around 06/28/2024) for CPE.  NEXT PREVENTATIVE PHYSICAL DUE IN 1 YEAR.  PATIENT COUNSELING PROVIDED FOR ALL ADULT PATIENTS: A well balanced diet low in saturated fats, cholesterol, and moderation in carbohydrates.  This can be as simple as monitoring portion sizes and cutting back on sugary beverages such as soda and juice to start with.    Daily water consumption of at  least 64 ounces.  Physical activity at least 180 minutes per week.  If just starting out, start 10 minutes a day and work your way up.   This can be as simple as taking the stairs instead of the elevator and walking 2-3 laps around the  office  purposefully every day.   STD protection, partner selection, and regular testing if high risk.  Limited consumption of alcoholic beverages if alcohol is consumed. For men, I recommend no more than 14 alcoholic beverages per week, spread out throughout the week (max 2 per day). Avoid "binge" drinking or consuming large quantities of alcohol in one setting.  Please let me know if you feel you may need help with reduction or quitting alcohol consumption.   Avoidance of nicotine, if used. Please let me know if you feel you may need help with reduction or quitting nicotine use.   Daily mental health attention. This can be in the form of 5 minute daily meditation, prayer, journaling, yoga, reflection, etc.  Purposeful attention to your emotions and mental state can significantly improve your overall wellbeing  and  Health.  Please know that I am here to help you with all of your health care goals and am happy to work with you to find a solution that works best for you.  The greatest advice I have received with any changes in life are to take it one step at a time, that even means if all you can focus on is the next 60 seconds, then do that and celebrate your victories.  With any changes in life, you will have set backs, and that is OK. The important thing to remember is, if you have a set back, it is not a failure, it is an opportunity to try again! Screening Testing Mammogram Every 1 -2 years based on history and risk factors Starting at age 22 Pap Smear Ages 21-39 every 3 years Ages 58-65 every 5 years with HPV testing More frequent testing may be required based on results and history Colon Cancer Screening Every 1-10 years based on test performed,  risk factors, and history Starting at age 34 Bone Density Screening Every 2-10 years based on history Starting at age 54 for women Recommendations for men differ based on medication usage, history, and risk factors AAA Screening One time ultrasound Men 1-73 years old who have every smoked Lung Cancer Screening Low Dose Lung CT every 12 months Age 85-80 years with a 30 pack-year smoking history who still smoke or who have quit within the last 15 years   Screening Labs Routine  Labs: Complete Blood Count (CBC), Complete Metabolic Panel (CMP), Cholesterol (Lipid Panel) Every 6-12 months based on history and medications May be recommended more frequently based on current conditions or previous results Hemoglobin A1c Lab Every 3-12 months based on history and previous results Starting at age 56 or earlier with diagnosis of diabetes, high cholesterol, BMI >26, and/or risk factors Frequent monitoring for patients with diabetes to ensure blood sugar control Thyroid Panel (TSH) Every 6 months based on history, symptoms, and risk factors May be repeated more often if on medication HIV One time testing for all patients 33 and older May be repeated more frequently for patients with increased risk factors or exposure Hepatitis C One time testing for all patients 26 and older May be repeated more frequently for patients with increased risk factors or exposure Gonorrhea, Chlamydia Every 12 months for all sexually active persons 13-24 years Additional monitoring may be recommended for those who are considered high risk or who have symptoms Every 12 months for any woman on birth control, regardless of sexual activity PSA Men 11-64 years old with risk factors Additional screening may be recommended from age 72-69 based on risk factors, symptoms, and history  Vaccine Recommendations Tetanus Booster All adults every 10 years Flu Vaccine All patients 6 months and older every year COVID  Vaccine All patients 12 years and older Initial dosing with booster May recommend additional booster based on age and health history HPV Vaccine 2 doses all patients age 49-26 Dosing may be considered for patients over 26 Shingles Vaccine (Shingrix) 2 doses all adults 55 years and older Pneumonia (Pneumovax 23) All adults 65 years and older May recommend earlier dosing based on health history One year apart from Prevnar 13 Pneumonia (Prevnar 74) All adults 65 years and older Dosed 1 year after Pneumovax 23 Pneumonia (Prevnar 20) One time alternative to the two dosing of 13 and 23 For all adults with initial dose of 23, 20 is recommended 1 year later For all adults with initial dose of 13, 23 is still recommended as second option 1 year later           Last PAP:  Covid:

## 2023-06-29 NOTE — Patient Instructions (Signed)
Please let me know if the medication appears to be working for you over the next 4 weeks. If not, please let me know!!

## 2023-06-29 NOTE — Assessment & Plan Note (Signed)

## 2023-06-29 NOTE — Assessment & Plan Note (Signed)
Reports significant stress and anxiety related to work environment, including being overworked and underappreciated. Experiences difficulty focusing, irritability, and a sense of being overwhelmed. Previously tried Ativan without success and is seeking a long-acting medication. Lexapro was effective but discontinued due to feeling indifferent. Discussed potential benefits of Lexapro for long-term anxiety management and the possibility of FMLA for mental health. Informed about potential side effects and the need for monitoring. - Prescribe Lexapro - Monitor for effectiveness and side effects - Consider FMLA for mental health if needed

## 2023-06-29 NOTE — Assessment & Plan Note (Signed)
Struggling with weight loss, tried various medications including Wegovy and Ozempic with mixed results. Recently started Zepbound but experienced severe vomiting, possibly due to a viral illness or food poisoning. Hesitant to continue medication due to adverse reaction. Discussed the potential benefits of Zepbound, including better results compared to previous medications, and the importance of protein intake. Informed about the use of Zofran for nausea and the need to monitor for further adverse reactions. - Continue Zepbound with caution - Use Zofran as needed for nausea - Encourage protein intake through shakes or high-protein foods - Monitor for any further adverse reactions

## 2023-06-30 LAB — CBC WITH DIFFERENTIAL/PLATELET
Basophils Absolute: 0.1 10*3/uL (ref 0.0–0.2)
Basos: 1 %
EOS (ABSOLUTE): 0.1 10*3/uL (ref 0.0–0.4)
Eos: 2 %
Hematocrit: 44.5 % (ref 34.0–46.6)
Hemoglobin: 15.1 g/dL (ref 11.1–15.9)
Immature Grans (Abs): 0 10*3/uL (ref 0.0–0.1)
Immature Granulocytes: 0 %
Lymphocytes Absolute: 2.8 10*3/uL (ref 0.7–3.1)
Lymphs: 33 %
MCH: 32.3 pg (ref 26.6–33.0)
MCHC: 33.9 g/dL (ref 31.5–35.7)
MCV: 95 fL (ref 79–97)
Monocytes Absolute: 0.5 10*3/uL (ref 0.1–0.9)
Monocytes: 6 %
Neutrophils Absolute: 5.1 10*3/uL (ref 1.4–7.0)
Neutrophils: 58 %
Platelets: 248 10*3/uL (ref 150–450)
RBC: 4.67 x10E6/uL (ref 3.77–5.28)
RDW: 12.6 % (ref 11.7–15.4)
WBC: 8.7 10*3/uL (ref 3.4–10.8)

## 2023-06-30 LAB — CMP14+EGFR
ALT: 28 [IU]/L (ref 0–32)
AST: 19 [IU]/L (ref 0–40)
Albumin: 4.4 g/dL (ref 3.9–4.9)
Alkaline Phosphatase: 63 [IU]/L (ref 44–121)
BUN/Creatinine Ratio: 21 (ref 9–23)
BUN: 16 mg/dL (ref 6–24)
Bilirubin Total: 1 mg/dL (ref 0.0–1.2)
CO2: 24 mmol/L (ref 20–29)
Calcium: 9 mg/dL (ref 8.7–10.2)
Chloride: 101 mmol/L (ref 96–106)
Creatinine, Ser: 0.77 mg/dL (ref 0.57–1.00)
Globulin, Total: 2.7 g/dL (ref 1.5–4.5)
Glucose: 81 mg/dL (ref 70–99)
Potassium: 4.2 mmol/L (ref 3.5–5.2)
Sodium: 138 mmol/L (ref 134–144)
Total Protein: 7.1 g/dL (ref 6.0–8.5)
eGFR: 99 mL/min/{1.73_m2} (ref >59)

## 2023-06-30 LAB — HEMOGLOBIN A1C
Est. average glucose Bld gHb Est-mCnc: 103 mg/dL
Hgb A1c MFr Bld: 5.2 % (ref 4.8–5.6)

## 2023-06-30 LAB — LIPID PANEL
Chol/HDL Ratio: 3.6 {ratio} (ref 0.0–4.4)
Cholesterol, Total: 208 mg/dL — ABNORMAL HIGH (ref 100–199)
HDL: 57 mg/dL (ref >39)
LDL Chol Calc (NIH): 133 mg/dL — ABNORMAL HIGH (ref 0–99)
Triglycerides: 103 mg/dL (ref 0–149)
VLDL Cholesterol Cal: 18 mg/dL (ref 5–40)

## 2023-06-30 LAB — TSH: TSH: 1.5 u[IU]/mL (ref 0.450–4.500)

## 2023-07-02 ENCOUNTER — Encounter: Payer: Self-pay | Admitting: Nurse Practitioner

## 2023-09-04 ENCOUNTER — Other Ambulatory Visit: Payer: Self-pay

## 2023-09-04 ENCOUNTER — Telehealth: Payer: Self-pay | Admitting: Nurse Practitioner

## 2023-09-04 DIAGNOSIS — R42 Dizziness and giddiness: Secondary | ICD-10-CM

## 2023-09-04 NOTE — Telephone Encounter (Signed)
 Copied from CRM [id number]. Topic: Referral - Request for Referral >> Sep 04, 2023  2:33 PM Alpha Arts wrote: Did the patient discuss referral with their provider in the last year? Yes (If No - schedule appointment) (If Yes - send message)  Appointment offered? No  Type of order/referral and detailed reason for visit: ENT for Vertigo   Preference of office, provider, location:  [facility] [address] [place], [place] [address] Fax: [phone]  If referral order, have you been seen by this specialty before? No (If Yes, this issue or another issue? When? Where?  Can we respond through MyChart? Yes

## 2023-09-16 ENCOUNTER — Encounter (INDEPENDENT_AMBULATORY_CARE_PROVIDER_SITE_OTHER): Payer: Self-pay | Admitting: Otolaryngology

## 2023-09-23 ENCOUNTER — Encounter (INDEPENDENT_AMBULATORY_CARE_PROVIDER_SITE_OTHER): Payer: Self-pay

## 2023-11-25 ENCOUNTER — Other Ambulatory Visit: Payer: Self-pay | Admitting: Medical

## 2023-11-25 ENCOUNTER — Telehealth: Payer: Self-pay

## 2023-11-25 DIAGNOSIS — F411 Generalized anxiety disorder: Secondary | ICD-10-CM

## 2023-11-25 NOTE — Telephone Encounter (Signed)
 Refill request: Escitalopram  tabs 10 mg @ [facility] Last appt and labs 06/29/23

## 2023-11-25 NOTE — Telephone Encounter (Signed)
 Last appt and labs on 06/29/23

## 2023-12-02 MED ORDER — ESCITALOPRAM OXALATE 10 MG PO TABS
10.0000 mg | ORAL_TABLET | Freq: Every day | ORAL | 1 refills | Status: DC
  Filled 2023-12-02: qty 90, 90d supply, fill #0

## 2023-12-02 NOTE — Addendum Note (Signed)
 Addended by: Annarae Macnair, CAMIE E on: 12/02/2023 06:29 PM   Modules accepted: Orders

## 2023-12-03 ENCOUNTER — Other Ambulatory Visit (HOSPITAL_BASED_OUTPATIENT_CLINIC_OR_DEPARTMENT_OTHER): Payer: Self-pay

## 2023-12-04 ENCOUNTER — Other Ambulatory Visit (HOSPITAL_BASED_OUTPATIENT_CLINIC_OR_DEPARTMENT_OTHER): Payer: Self-pay

## 2023-12-04 ENCOUNTER — Other Ambulatory Visit: Payer: Self-pay | Admitting: Nurse Practitioner

## 2023-12-04 ENCOUNTER — Telehealth: Payer: Self-pay

## 2023-12-04 DIAGNOSIS — F411 Generalized anxiety disorder: Secondary | ICD-10-CM

## 2023-12-04 MED ORDER — ESCITALOPRAM OXALATE 10 MG PO TABS: 10.0000 mg | ORAL_TABLET | Freq: Every day | ORAL | 1 refills | Status: AC

## 2023-12-04 NOTE — Telephone Encounter (Signed)
 Copied from CRM #[id number]. Topic: Clinical - Medication Question >> Dec 04, 2023 11:37 AM Elizabeth Franklin wrote: Patient has enough medicine to last her 14 days

## 2024-01-19 ENCOUNTER — Encounter: Payer: Self-pay | Admitting: Nurse Practitioner

## 2024-01-19 ENCOUNTER — Ambulatory Visit: Admitting: Nurse Practitioner

## 2024-01-19 VITALS — BP 122/80 | HR 71 | Wt 229.0 lb

## 2024-01-19 DIAGNOSIS — F413 Other mixed anxiety disorders: Secondary | ICD-10-CM | POA: Diagnosis not present

## 2024-01-19 DIAGNOSIS — M5489 Other dorsalgia: Secondary | ICD-10-CM | POA: Diagnosis not present

## 2024-01-19 DIAGNOSIS — Z6832 Body mass index (BMI) 32.0-32.9, adult: Secondary | ICD-10-CM

## 2024-01-19 DIAGNOSIS — L308 Other specified dermatitis: Secondary | ICD-10-CM

## 2024-01-19 DIAGNOSIS — Z23 Encounter for immunization: Secondary | ICD-10-CM

## 2024-01-19 DIAGNOSIS — F988 Other specified behavioral and emotional disorders with onset usually occurring in childhood and adolescence: Secondary | ICD-10-CM | POA: Diagnosis not present

## 2024-01-19 DIAGNOSIS — N951 Menopausal and female climacteric states: Secondary | ICD-10-CM

## 2024-01-19 MED ORDER — METHYLPHENIDATE HCL 10 MG PO TABS: 10.0000 mg | ORAL_TABLET | Freq: Two times a day (BID) | ORAL | 0 refills | Status: AC

## 2024-01-19 MED ORDER — CYCLOBENZAPRINE HCL 10 MG PO TABS: 10.0000 mg | ORAL_TABLET | Freq: Three times a day (TID) | ORAL | 6 refills | Status: AC | PRN
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
## 2024-01-19 NOTE — Patient Instructions (Signed)
 Let me know if this dose works well for you or if we need to try to increase the dosage.

## 2024-01-25 DIAGNOSIS — F4322 Adjustment disorder with anxiety: Secondary | ICD-10-CM | POA: Diagnosis not present

## 2024-01-26 ENCOUNTER — Encounter: Payer: Self-pay | Admitting: Nurse Practitioner

## 2024-01-26 DIAGNOSIS — N951 Menopausal and female climacteric states: Secondary | ICD-10-CM | POA: Insufficient documentation

## 2024-01-26 DIAGNOSIS — M549 Dorsalgia, unspecified: Secondary | ICD-10-CM | POA: Insufficient documentation

## 2024-01-26 DIAGNOSIS — F988 Other specified behavioral and emotional disorders with onset usually occurring in childhood and adolescence: Secondary | ICD-10-CM | POA: Insufficient documentation

## 2024-01-26 NOTE — Assessment & Plan Note (Signed)
 Maintaining weight despite structured diet and regular walking. Hormonal changes may impact weight. Previous use of Wegovy  was effective but not preferred long-term. May not be consuming enough calories, contributing to weight maintenance. - Consult with a nutritionist for dietary planning and caloric intake assessment. - Increase afternoon caloric intake and add fruit to breakfast cereal.

## 2024-01-26 NOTE — Assessment & Plan Note (Signed)
 Increased anxiety in crowded or enclosed spaces, exacerbated by anticipation and situational triggers. Currently taking escitalopram  inconsistently, but adherence has improved with a pill organizer. Plans to see a therapist for coping mechanisms. As-needed anxiety medications like Ativan may cause drowsiness and are not preferred. - Continue escitalopram  with improved adherence using a pill organizer. - Attend therapy session on the 15th for coping strategies.

## 2024-01-26 NOTE — Assessment & Plan Note (Signed)
 Difficulty focusing and retaining information, especially with new tasks, possibly related to perimenopausal hormonal changes. Phentermine  was ineffective, suggesting methylphenidate  may be more suitable. - Prescribe methylphenidate  10 mg twice a day, with instructions to adjust dose if needed up to 20 mg. - Discuss potential transition to long-acting formulation if short-acting is effective.

## 2024-01-26 NOTE — Assessment & Plan Note (Signed)
 Irregular menstrual cycles and hormonal fluctuations causing difficulty focusing and anxiety. Hormonal changes can trigger attention and concentration issues. Birth control may help with hormonal regulation, but ADHD medication is preferred for focus issues.

## 2024-01-26 NOTE — Assessment & Plan Note (Signed)
 Eczema in the external ear causing leakage and irritation. Eardrums are intact, and the condition appears to be localized eczema. - Apply hydrocortisone cream to the affected area in the ear using a Q-tip for one week.

## 2024-01-26 NOTE — Assessment & Plan Note (Addendum)
-   Refill prescription for Flexeril  as this is effective in management for spasms and pain. Using intermittently.

## 2024-02-01 DIAGNOSIS — F4322 Adjustment disorder with anxiety: Secondary | ICD-10-CM | POA: Diagnosis not present

## 2024-02-11 ENCOUNTER — Encounter: Payer: Self-pay | Admitting: Nurse Practitioner

## 2024-02-11 DIAGNOSIS — R635 Abnormal weight gain: Secondary | ICD-10-CM

## 2024-02-11 DIAGNOSIS — Z6832 Body mass index (BMI) 32.0-32.9, adult: Secondary | ICD-10-CM

## 2024-02-12 MED ORDER — TIRZEPATIDE-WEIGHT MANAGEMENT 2.5 MG/0.5ML ~~LOC~~ SOLN: 2.5000 mg | SUBCUTANEOUS | 0 refills | Status: DC

## 2024-03-03 ENCOUNTER — Other Ambulatory Visit: Payer: Self-pay | Admitting: Nurse Practitioner

## 2024-03-03 DIAGNOSIS — R635 Abnormal weight gain: Secondary | ICD-10-CM

## 2024-03-03 DIAGNOSIS — Z6832 Body mass index (BMI) 32.0-32.9, adult: Secondary | ICD-10-CM

## 2024-03-04 ENCOUNTER — Other Ambulatory Visit: Payer: Self-pay

## 2024-03-04 MED ORDER — TIRZEPATIDE-WEIGHT MANAGEMENT 5 MG/0.5ML ~~LOC~~ SOLN
5.0000 mg | SUBCUTANEOUS | 0 refills | Status: DC
Start: 2024-03-04 — End: 2024-03-29

## 2024-03-14 ENCOUNTER — Encounter: Payer: Self-pay | Admitting: Radiology

## 2024-03-21 ENCOUNTER — Encounter: Admitting: Obstetrics and Gynecology

## 2024-03-29 ENCOUNTER — Other Ambulatory Visit: Payer: Self-pay | Admitting: Nurse Practitioner

## 2024-04-12 DIAGNOSIS — F4322 Adjustment disorder with anxiety: Secondary | ICD-10-CM | POA: Diagnosis not present

## 2024-04-20 ENCOUNTER — Other Ambulatory Visit: Payer: Self-pay | Admitting: Nurse Practitioner

## 2024-04-22 ENCOUNTER — Encounter (HOSPITAL_BASED_OUTPATIENT_CLINIC_OR_DEPARTMENT_OTHER): Payer: Self-pay | Admitting: Radiology

## 2024-04-22 ENCOUNTER — Other Ambulatory Visit (HOSPITAL_BASED_OUTPATIENT_CLINIC_OR_DEPARTMENT_OTHER): Payer: Self-pay | Admitting: Nurse Practitioner

## 2024-04-22 ENCOUNTER — Ambulatory Visit (HOSPITAL_BASED_OUTPATIENT_CLINIC_OR_DEPARTMENT_OTHER): Admission: RE | Admit: 2024-04-22 | Discharge: 2024-04-22 | Disposition: A | Source: Ambulatory Visit

## 2024-04-22 ENCOUNTER — Other Ambulatory Visit: Payer: Self-pay

## 2024-04-22 ENCOUNTER — Encounter: Payer: Self-pay | Admitting: Nurse Practitioner

## 2024-04-22 DIAGNOSIS — Z1231 Encounter for screening mammogram for malignant neoplasm of breast: Secondary | ICD-10-CM

## 2024-04-22 MED ORDER — ZEPBOUND 7.5 MG/0.5ML ~~LOC~~ SOLN: 7.5000 mg | SUBCUTANEOUS | 0 refills | Status: DC

## 2024-04-25 DIAGNOSIS — F4322 Adjustment disorder with anxiety: Secondary | ICD-10-CM | POA: Diagnosis not present

## 2024-04-26 ENCOUNTER — Encounter (HOSPITAL_BASED_OUTPATIENT_CLINIC_OR_DEPARTMENT_OTHER): Admitting: Radiology

## 2024-04-26 DIAGNOSIS — Z1231 Encounter for screening mammogram for malignant neoplasm of breast: Secondary | ICD-10-CM

## 2024-05-04 ENCOUNTER — Ambulatory Visit: Payer: Self-pay | Admitting: Nurse Practitioner

## 2024-05-18 ENCOUNTER — Other Ambulatory Visit: Payer: Self-pay | Admitting: Nurse Practitioner

## 2024-05-19 ENCOUNTER — Encounter: Admitting: Obstetrics and Gynecology

## 2024-05-20 ENCOUNTER — Other Ambulatory Visit: Payer: Self-pay | Admitting: Nurse Practitioner

## 2024-05-23 ENCOUNTER — Encounter: Payer: Self-pay | Admitting: Nurse Practitioner

## 2024-05-31 ENCOUNTER — Other Ambulatory Visit: Payer: Self-pay

## 2024-05-31 MED ORDER — TIRZEPATIDE-WEIGHT MANAGEMENT 10 MG/0.5ML ~~LOC~~ SOLN: 10.0000 mg | SUBCUTANEOUS | 0 refills | Status: AC

## 2024-08-16 ENCOUNTER — Encounter: Payer: Self-pay | Admitting: Nurse Practitioner
